# Patient Record
Sex: Female | Born: 1945 | Race: White | Hispanic: No | Marital: Married | Smoking: Former smoker
Health system: Southern US, Community
[De-identification: ages and names within clinical notes are randomized; demographics above are authoritative.]

## PROBLEM LIST (undated history)

## (undated) DIAGNOSIS — R112 Nausea with vomiting, unspecified: Secondary | ICD-10-CM

## (undated) DIAGNOSIS — K2270 Barrett's esophagus without dysplasia: Secondary | ICD-10-CM

## (undated) DIAGNOSIS — Z86010 Personal history of colonic polyps: Secondary | ICD-10-CM

## (undated) DIAGNOSIS — Z803 Family history of malignant neoplasm of breast: Secondary | ICD-10-CM

## (undated) DIAGNOSIS — IMO0002 Reserved for concepts with insufficient information to code with codable children: Secondary | ICD-10-CM

## (undated) DIAGNOSIS — M1990 Unspecified osteoarthritis, unspecified site: Secondary | ICD-10-CM

## (undated) DIAGNOSIS — K209 Esophagitis, unspecified without bleeding: Secondary | ICD-10-CM

## (undated) DIAGNOSIS — K648 Other hemorrhoids: Secondary | ICD-10-CM

## (undated) DIAGNOSIS — K222 Esophageal obstruction: Secondary | ICD-10-CM

## (undated) DIAGNOSIS — C50919 Malignant neoplasm of unspecified site of unspecified female breast: Secondary | ICD-10-CM

## (undated) DIAGNOSIS — H3530 Unspecified macular degeneration: Secondary | ICD-10-CM

## (undated) DIAGNOSIS — Z860101 Personal history of adenomatous and serrated colon polyps: Secondary | ICD-10-CM

## (undated) DIAGNOSIS — Z460 Encounter for fitting and adjustment of spectacles and contact lenses: Secondary | ICD-10-CM

## (undated) DIAGNOSIS — I10 Essential (primary) hypertension: Secondary | ICD-10-CM

## (undated) DIAGNOSIS — N840 Polyp of corpus uteri: Secondary | ICD-10-CM

## (undated) DIAGNOSIS — R42 Dizziness and giddiness: Secondary | ICD-10-CM

## (undated) DIAGNOSIS — G43909 Migraine, unspecified, not intractable, without status migrainosus: Secondary | ICD-10-CM

## (undated) DIAGNOSIS — D126 Benign neoplasm of colon, unspecified: Secondary | ICD-10-CM

## (undated) DIAGNOSIS — K219 Gastro-esophageal reflux disease without esophagitis: Secondary | ICD-10-CM

## (undated) DIAGNOSIS — IMO0001 Reserved for inherently not codable concepts without codable children: Secondary | ICD-10-CM

## (undated) DIAGNOSIS — Z9889 Other specified postprocedural states: Secondary | ICD-10-CM

## (undated) DIAGNOSIS — I499 Cardiac arrhythmia, unspecified: Secondary | ICD-10-CM

## (undated) DIAGNOSIS — K449 Diaphragmatic hernia without obstruction or gangrene: Secondary | ICD-10-CM

## (undated) HISTORY — DX: Migraine, unspecified, not intractable, without status migrainosus: G43.909

## (undated) HISTORY — DX: Unspecified osteoarthritis, unspecified site: M19.90

## (undated) HISTORY — DX: Esophagitis, unspecified without bleeding: K20.90

## (undated) HISTORY — DX: Malignant neoplasm of unspecified site of unspecified female breast: C50.919

## (undated) HISTORY — DX: Benign neoplasm of colon, unspecified: D12.6

## (undated) HISTORY — PX: TONSILLECTOMY: SUR1361

## (undated) HISTORY — PX: OVARIAN CYST REMOVAL: SHX89

## (undated) HISTORY — DX: Esophageal obstruction: K22.2

## (undated) HISTORY — DX: Reserved for concepts with insufficient information to code with codable children: IMO0002

## (undated) HISTORY — DX: Reserved for inherently not codable concepts without codable children: IMO0001

## (undated) HISTORY — DX: Barrett's esophagus without dysplasia: K22.70

## (undated) HISTORY — DX: Personal history of colonic polyps: Z86.010

## (undated) HISTORY — DX: Personal history of adenomatous and serrated colon polyps: Z86.0101

## (undated) HISTORY — DX: Gastro-esophageal reflux disease without esophagitis: K21.9

## (undated) HISTORY — DX: Other hemorrhoids: K64.8

## (undated) HISTORY — DX: Family history of malignant neoplasm of breast: Z80.3

## (undated) HISTORY — PX: COLONOSCOPY: SHX174

## (undated) HISTORY — DX: Diaphragmatic hernia without obstruction or gangrene: K44.9

## (undated) HISTORY — PX: UPPER GI ENDOSCOPY: SHX6162

## (undated) HISTORY — DX: Essential (primary) hypertension: I10

## (undated) HISTORY — DX: Esophagitis, unspecified: K20.9

---

## 1997-06-14 ENCOUNTER — Ambulatory Visit (HOSPITAL_COMMUNITY): Admission: RE | Admit: 1997-06-14 | Discharge: 1997-06-14 | Payer: Self-pay | Admitting: Sports Medicine

## 1997-06-14 ENCOUNTER — Encounter: Admission: RE | Admit: 1997-06-14 | Discharge: 1997-06-14 | Payer: Self-pay | Admitting: Family Medicine

## 1998-08-07 ENCOUNTER — Other Ambulatory Visit: Admission: RE | Admit: 1998-08-07 | Discharge: 1998-08-07 | Payer: Self-pay | Admitting: Obstetrics and Gynecology

## 1999-05-08 ENCOUNTER — Other Ambulatory Visit: Admission: RE | Admit: 1999-05-08 | Discharge: 1999-05-08 | Payer: Self-pay | Admitting: Family Medicine

## 1999-07-08 ENCOUNTER — Encounter (INDEPENDENT_AMBULATORY_CARE_PROVIDER_SITE_OTHER): Payer: Self-pay

## 1999-07-08 ENCOUNTER — Ambulatory Visit (HOSPITAL_COMMUNITY): Admission: RE | Admit: 1999-07-08 | Discharge: 1999-07-08 | Payer: Self-pay | Admitting: Internal Medicine

## 1999-07-08 ENCOUNTER — Encounter: Payer: Self-pay | Admitting: Internal Medicine

## 1999-07-11 ENCOUNTER — Encounter: Payer: Self-pay | Admitting: Internal Medicine

## 2000-08-26 ENCOUNTER — Other Ambulatory Visit: Admission: RE | Admit: 2000-08-26 | Discharge: 2000-08-26 | Payer: Self-pay | Admitting: Family Medicine

## 2003-03-10 HISTORY — PX: TOTAL HIP ARTHROPLASTY: SHX124

## 2003-07-02 ENCOUNTER — Encounter: Admission: RE | Admit: 2003-07-02 | Discharge: 2003-07-02 | Payer: Self-pay | Admitting: Orthopedic Surgery

## 2003-08-13 ENCOUNTER — Inpatient Hospital Stay (HOSPITAL_COMMUNITY): Admission: RE | Admit: 2003-08-13 | Discharge: 2003-08-17 | Payer: Self-pay | Admitting: Orthopedic Surgery

## 2004-01-23 ENCOUNTER — Encounter: Admission: RE | Admit: 2004-01-23 | Discharge: 2004-01-23 | Payer: Self-pay | Admitting: Orthopedic Surgery

## 2004-02-11 ENCOUNTER — Ambulatory Visit: Payer: Self-pay | Admitting: Internal Medicine

## 2004-02-18 ENCOUNTER — Ambulatory Visit: Payer: Self-pay | Admitting: Internal Medicine

## 2004-03-09 HISTORY — PX: NISSEN FUNDOPLICATION: SHX2091

## 2004-03-13 ENCOUNTER — Ambulatory Visit: Payer: Self-pay | Admitting: Internal Medicine

## 2004-03-17 ENCOUNTER — Ambulatory Visit: Payer: Self-pay | Admitting: Internal Medicine

## 2004-04-14 ENCOUNTER — Encounter: Admission: RE | Admit: 2004-04-14 | Discharge: 2004-04-14 | Payer: Self-pay | Admitting: Internal Medicine

## 2004-04-14 ENCOUNTER — Encounter (INDEPENDENT_AMBULATORY_CARE_PROVIDER_SITE_OTHER): Payer: Self-pay | Admitting: *Deleted

## 2004-05-06 ENCOUNTER — Encounter: Admission: RE | Admit: 2004-05-06 | Discharge: 2004-05-06 | Payer: Self-pay | Admitting: Orthopedic Surgery

## 2004-06-03 ENCOUNTER — Encounter: Payer: Self-pay | Admitting: Internal Medicine

## 2004-06-03 ENCOUNTER — Ambulatory Visit (HOSPITAL_COMMUNITY): Admission: RE | Admit: 2004-06-03 | Discharge: 2004-06-03 | Payer: Self-pay | Admitting: Internal Medicine

## 2004-09-04 ENCOUNTER — Ambulatory Visit (HOSPITAL_COMMUNITY): Admission: RE | Admit: 2004-09-04 | Discharge: 2004-09-04 | Payer: Self-pay | Admitting: Cardiology

## 2004-09-24 ENCOUNTER — Ambulatory Visit: Payer: Self-pay | Admitting: Internal Medicine

## 2004-09-29 ENCOUNTER — Encounter (INDEPENDENT_AMBULATORY_CARE_PROVIDER_SITE_OTHER): Payer: Self-pay | Admitting: Specialist

## 2004-09-29 ENCOUNTER — Ambulatory Visit: Payer: Self-pay | Admitting: Internal Medicine

## 2004-10-08 ENCOUNTER — Encounter (INDEPENDENT_AMBULATORY_CARE_PROVIDER_SITE_OTHER): Payer: Self-pay | Admitting: *Deleted

## 2004-10-29 ENCOUNTER — Inpatient Hospital Stay (HOSPITAL_COMMUNITY): Admission: RE | Admit: 2004-10-29 | Discharge: 2004-10-30 | Payer: Self-pay | Admitting: Surgery

## 2004-10-30 ENCOUNTER — Encounter (INDEPENDENT_AMBULATORY_CARE_PROVIDER_SITE_OTHER): Payer: Self-pay | Admitting: *Deleted

## 2006-02-03 IMAGING — CR DG PELVIS 1-2V
2 series · 2 of 2 positions shown · non-contrast
Comparison: none

CLINICAL DATA: total hip replacement
 PELVIS (ONE VIEW)
 Two images were obtained.  There has been a total hip replacement on the right with satisfactory position and alignment on this single view. Mild cartilage loss and spurring on the left hip.
 IMPRESSION
 Satisfactory right hip replacement.

[view not recorded (1 of 2)]
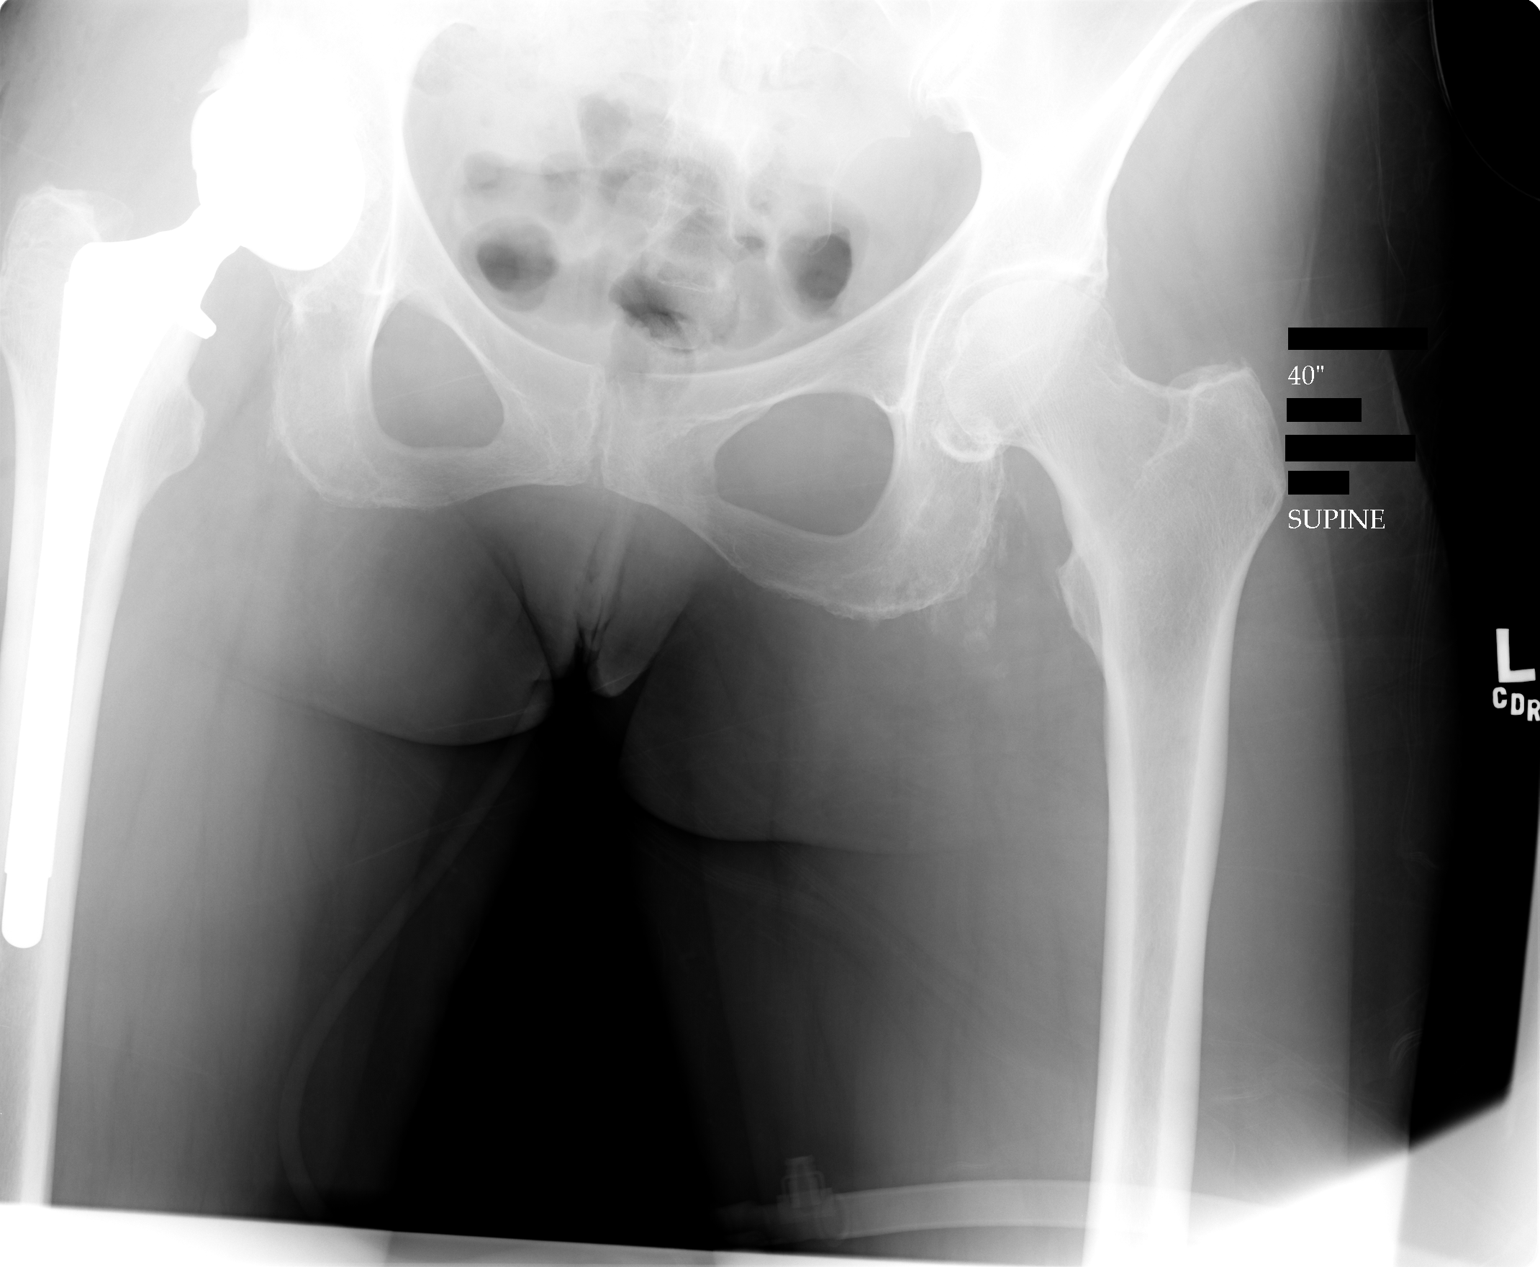

[view not recorded (2 of 2)]
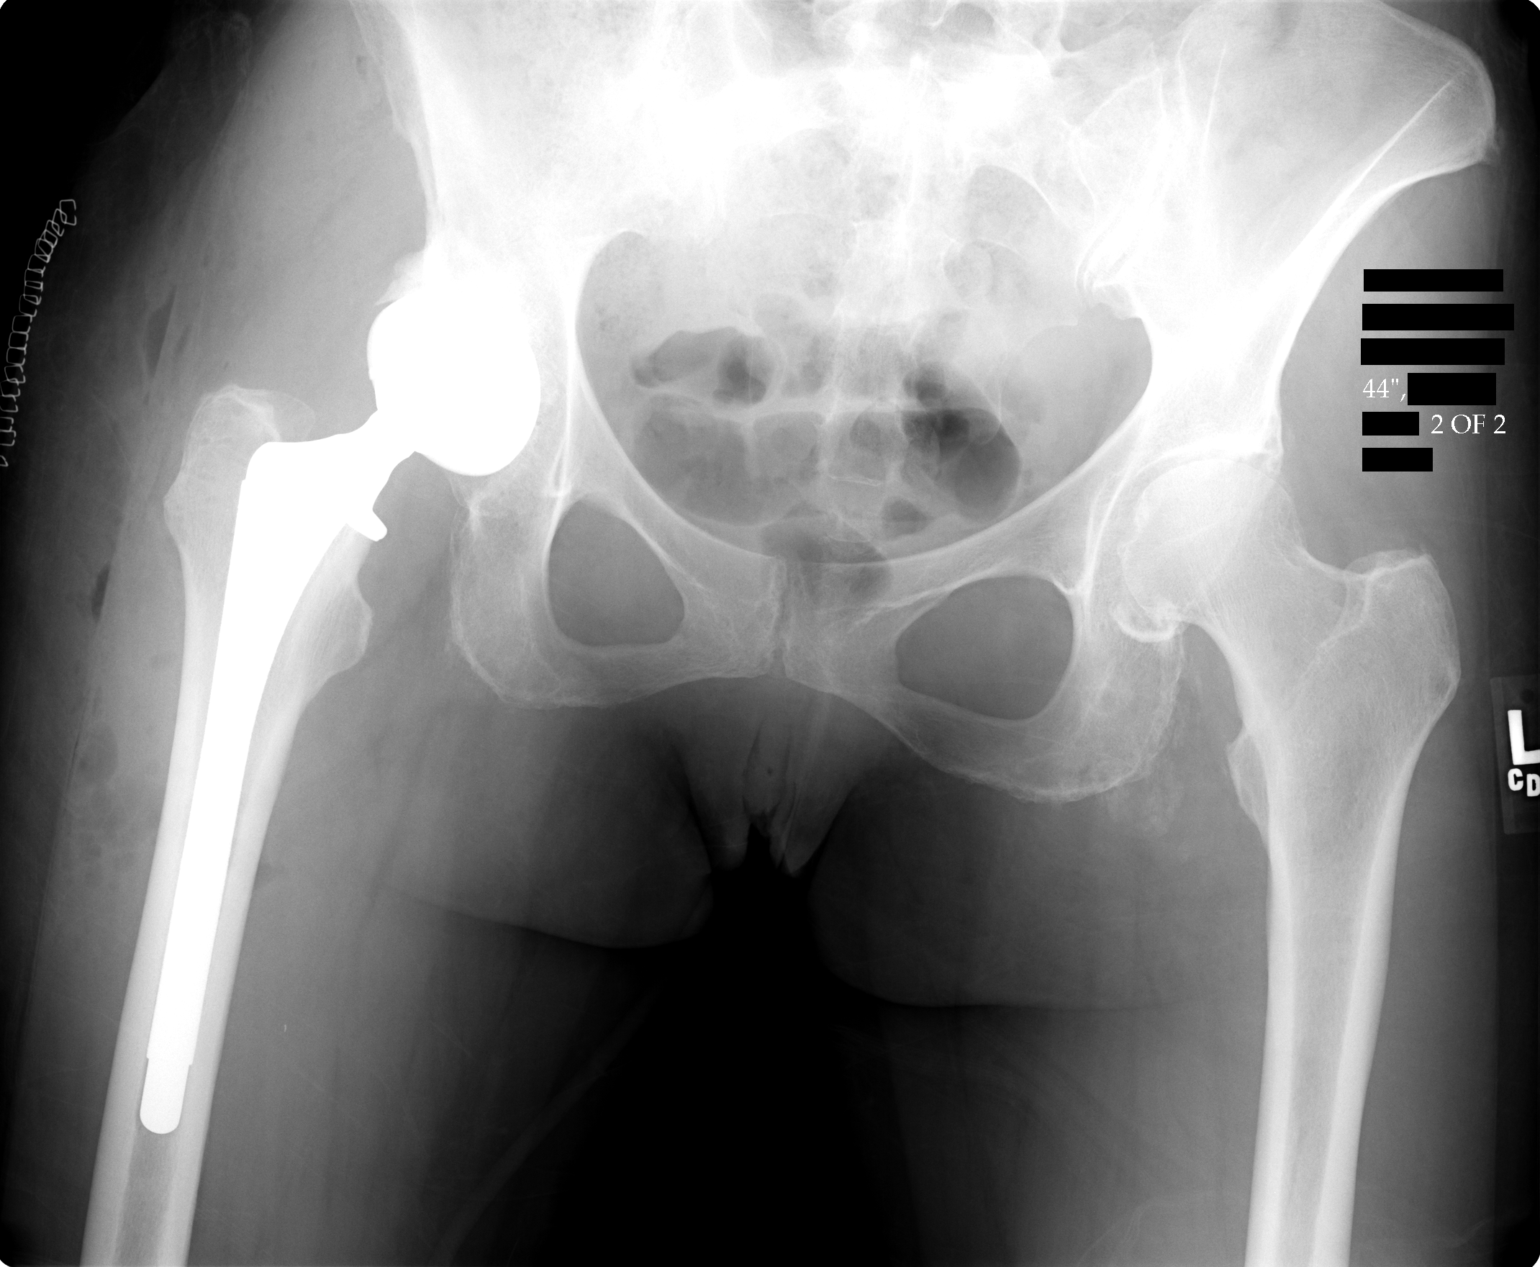

[2 of 2 positions shown; findings below may reference images not displayed]

## 2007-03-18 ENCOUNTER — Ambulatory Visit: Payer: Self-pay | Admitting: Internal Medicine

## 2007-03-25 ENCOUNTER — Ambulatory Visit: Payer: Self-pay | Admitting: Internal Medicine

## 2007-03-25 ENCOUNTER — Encounter: Payer: Self-pay | Admitting: Internal Medicine

## 2007-03-25 DIAGNOSIS — K2270 Barrett's esophagus without dysplasia: Secondary | ICD-10-CM

## 2007-03-25 HISTORY — DX: Barrett's esophagus without dysplasia: K22.70

## 2009-02-04 ENCOUNTER — Encounter: Admission: RE | Admit: 2009-02-04 | Discharge: 2009-02-08 | Payer: Self-pay | Admitting: Orthopedic Surgery

## 2009-03-11 ENCOUNTER — Encounter (INDEPENDENT_AMBULATORY_CARE_PROVIDER_SITE_OTHER): Payer: Self-pay | Admitting: *Deleted

## 2009-10-11 ENCOUNTER — Telehealth: Payer: Self-pay | Admitting: Internal Medicine

## 2009-12-17 DIAGNOSIS — K222 Esophageal obstruction: Secondary | ICD-10-CM

## 2009-12-17 DIAGNOSIS — K2270 Barrett's esophagus without dysplasia: Secondary | ICD-10-CM

## 2009-12-17 DIAGNOSIS — Z86010 Personal history of colonic polyps: Secondary | ICD-10-CM

## 2009-12-17 DIAGNOSIS — K648 Other hemorrhoids: Secondary | ICD-10-CM | POA: Insufficient documentation

## 2009-12-17 DIAGNOSIS — K219 Gastro-esophageal reflux disease without esophagitis: Secondary | ICD-10-CM | POA: Insufficient documentation

## 2009-12-17 DIAGNOSIS — K449 Diaphragmatic hernia without obstruction or gangrene: Secondary | ICD-10-CM | POA: Insufficient documentation

## 2010-04-08 NOTE — Op Note (Signed)
Summary: Nissen Fundoplication   NAMEFIORELA, Alison [name]                 ACCOUNT NO.:  [id number]   MEDICAL RECORD NO.:  [id number]          PATIENT TYPE:  OIB   LOCATION:  1619                         FACILITY:  [facility]   PHYSICIAN:  Thornton Park. Daphine Deutscher, MD  DATE OF BIRTH:  1946/02/10   DATE OF PROCEDURE:  10/28/2004  DATE OF DISCHARGE:                                 OPERATIVE REPORT   PREOPERATIVE DIAGNOSIS:  Small hiatal hernia with gastroesophageal reflux  disease.   POSTOPERATIVE DIAGNOSIS:  Prominent hiatal hernia with evidence of  gastroesophageal reflux disease.   PROCEDURE:  Laparoscopic Nissen fundoplication over a 56 lighted bougie with  closure of the hiatal hernia.   SURGEON:  Thornton Park. Daphine Deutscher, MD   ASSISTANT:  Danna Hefty, MD.   ANESTHESIA:  General endotracheal.   ESTIMATED BLOOD LOSS:  Minimal.   DRAINS:  None.   OPERATIVE TIME:  2 hours.   DESCRIPTION OF PROCEDURE:  This is 65 year old lady who was taken to OR 1,  given general anesthesia on October 28, 2004. Access to the abdomen was  gained with a 10/11 OptiView trocar in the left upper quadrant. She had a  very narrow costal margin. I placed two trocars in the right upper quadrant  and these were almost on top of each other because of her size. One was  placed to the left slightly below the umbilicus for the camera. A 5 mm with  the squiggly retractor was placed in the upper abdomen. This enabled the  liver to be retracted up and revealed a small hiatal hernia with evidence of  sliding. Dissection of the foregut was then performed and taken down to the  pars flaccida and coming along the right crus. I encountered a lot of  inflammatory changes chronic. To took down the peritoneal reflection of the  small hiatal hernia anteriorly. I took down the short gastric's and this was  somewhat tedious because the stomach was very stuck to the spleen. I did  this with minimal blood loss and dissected the left  crus.   A 56 lighted bougie was about to be inserted and I went ahead and closed the  hiatus posteriorly with a #0 Surgidek using the Endostitch with pledgets and  a second free suture of #0 Surgidek using the articulating robotic suturing  device. A single suture was placed anteriorly and then the 56 lighted bougie  was passed by Dr. Rica Mast without difficulty. A simple suture anteriorly was  tied which subsequently __________ fairly loose.   With the 56 bougie in place, a wrap was created invaginating the upper  portion of the cardia and fundus around the distal esophagus, sutured in  place with three sutures using the Endostitch. A stapler was placed on the  uppermost knot.  All three sutures had purchases of either the esophagus or  esophagogastric junction as there were tied. At the completion, Tisseel was  applied over the anterior portion of the stomach where the knots were and  also was used to fill the low anterior hiatus remnant that was visible when  the dilator was removed. Surgiwrap was then placed between the stomach and  the liver upon completing it and the retractor was removed. There was no  evidence of  bleeding or perforation of anything. The gas was withdrawn and ports were  removed. Skin was closed 4-0 Vicryl, Benzoin and Steri-Strips. The patient  seemed to tolerate the procedure well was taken to the recovery room in  satisfactory condition.      Thornton Park Daphine Deutscher, MD  Electronically Signed     MBM/MEDQ  D:  10/28/2004  T:  10/29/2004  Job:  [id number]   cc:   [name], M.D. [facility]  [address]  [place]  [place] [address]

## 2010-04-08 NOTE — Procedures (Signed)
Summary: EGD   EGD  Procedure date:  03/17/2004  Findings:      Location: [facility]   Patient Name: Alison [name], Alison [name]. MRN:  Procedure Procedures: Panendoscopy (EGD) CPT: 43235.    with biopsy(s)/brushing(s). CPT: [id number].    with esophageal dilation. CPT: G9296129.  Personnel: Endoscopist: Crawford Tamura L. Juanda Chance, MD.  Referred By: Eugenio Hoes Tawanna Cooler, MD.  Exam Location: Exam performed in Outpatient Clinic. Outpatient  Patient Consent: Procedure, Alternatives, Risks and Benefits discussed, consent obtained, from patient. Consent was obtained by the RN.  Indications Symptoms: Dysphagia. Reflux symptoms for 6-10 yrs, occurring daily.  Surveillance of: 2001.  History  Current Medications: Patient is not currently taking Coumadin.  Pre-Exam Physical: Performed Mar 17, 2004  Entire physical exam was normal.  Exam Exam Info: Maximum depth of insertion Duodenum, intended Duodenum. Vocal cords visualized. Gastric retroflexion performed. Images taken. ASA Classification: I. Tolerance: good.  Sedation Meds: Patient assessed and found to be appropriate for moderate (conscious) sedation. Fentanyl 75 mcg. given IV. Versed 7 mg. given IV. Cetacaine Spray given aerosolized.  Monitoring: BP and pulse monitoring done. Oximetry used. Supplemental O2 given  Findings - ESOPHAGEAL INFLAMMATION: established as a result of reflux. Severity is moderate, erosions present.  Length of inflammation: 2 cm. Edema present. [place] Classification: Grade B. Biopsy/Esoph Inflamtn taken. ICD9: Esophagitis: 530.10. Comments: wide open reflux, irregular g-e junction with lond erosion, mild fibrous ring.  - Dilation: Mid Esophagus. Maloney dilator used, Diameter: 48 mm, Minimal Resistance, Minimal Heme present on extraction.   Assessment Abnormal examination, see findings above.  Diagnoses: 530.10: Esophagitis.   Comments: reflux esophagitis, mild stricture, s/p passage of 68F  Maloney dilator, r/o Barrett's esophagus Events  Unplanned Intervention: No unplanned interventions were required.  Unplanned Events: There were no complications. Plans Medication(s): Await pathology. PPI: Aciphex 20 mg BID, starting Mar 17, 2004  Promotility: Metaclopramide 10 mg ac, starting Mar 17, 2004   Comments: patient may be a good candidate for Nissen Fundoplication Disposition: After procedure patient sent to recovery. After recovery patient sent home.   This report was created from the original endoscopy report, which was reviewed and signed by the above listed endoscopist.

## 2010-04-08 NOTE — Procedures (Signed)
Summary: EGD   EGD  Procedure date:  07/11/1999  Findings:      Location: [facility]                          [facility]  Patient:    Alison [name], Alison [name]                        MRN: [id number] Adm. Date:  [id number] Disc. Date: [id number] Attending:  Mervin Hack CC:         Evette Georges, M.D. [facility]                           Procedure Report  PROCEDURE:  Upper endoscopy with esophageal dilation.  INDICATION FOR PROCEDURE:  This 65 year old white female has complained of long-standing gastroesophageal reflux symptoms. She has also developed intermittent solid food dysphagia. She has been on Prilosec but has taken it only on a p.r.n. basis. She is undergoing upper endoscopy to rule out Barretts esophagus and also to proceed with esophageal dilation.  ENDOSCOPE:  Olympus single-channel videoscope  SEDATION:  Versed 8 mg IV, Demerol 50 mg IV  FINDINGS:  The Olympus single-channel video endoscope passed under direct vision through the posterior pharynx into the esophagus. The patient was monitored by pulse oximeter. Her oxygen saturations were normal. Proximal and mid esophageal  mucosa was unremarkable. There were several linear erosions at the gastroesophageal junction with increased fibrous tissue and mild nonobstructing stricture. The erosions constituted grade 1 reflux esophagitis. The erosions measured less than 5 mm in length. Biopsies were taken from the GE junction to rule out Barretts esophagus.  STOMACH:  The stomach was insufflated with air and showed a normal appearing gastric mucosal body in the gastric antrum. The pyloric outlet was normal. retroflexion of the endoscope revealed a normal fundus and cardia.  DUODENUM:  The duodenal bulb and descending duodenum were normal. A guidewire was then placed into the stomach under fluoroscopic guidance and Savary dilators were passed over the guidewire using 14, 15, 16, and 17 mm  dilators. The patient tolerated the procedure well.  IMPRESSION: 1. Benign distal esophageal stricture status post dilation to 17 mm. 2. Grade 1 reflux esophagitis.  PLAN: 1. Strict antireflux measures. 2. Avoidance of aspirin and NSAIDs. 3. The patient will need to continue Prilosec on a regular basis rather than on a    p.r.n. basis. She may need to repeat esophageal dilation in the future. DD:  07/08/99 TD:  07/10/99 Job: [id number]  This report was created from the original endoscopy report, which was reviewed and signed by the above listed endoscopist.

## 2010-04-08 NOTE — Procedures (Signed)
Summary: EGD   EGD  Procedure date:  09/29/2004  Findings:      Location: [facility]   Patient Name: Alison [name], Alison [name]. MRN:  Procedure Procedures: Panendoscopy (EGD) CPT: 43235.    with biopsy(s)/brushing(s). CPT: D1846139.  Personnel: Endoscopist: Dora L. Juanda Chance, MD.  Exam Location: Exam performed in Outpatient Clinic. Outpatient  Patient Consent: Procedure, Alternatives, Risks and Benefits discussed, consent obtained, from patient. Consent was obtained by the RN.  Indications Symptoms: Reflux symptoms for 1-5 yrs,  Surveillance of: Last exam: Jan, 2006.  Comments: hx of erosive esophagitis, pt is on PPI's, recent 24 hr ph probe off mjedications was negative. pt is being reendoscoped to assess  for reflux esophagitis History  Current Medications: Patient is not currently taking Coumadin.  Pre-Exam Physical: Performed Sep 29, 2004  Entire physical exam was normal.  Exam Exam Info: Maximum depth of insertion Duodenum, intended Duodenum. Vocal cords visualized. Gastric retroflexion performed. Images taken. ASA Classification: I. Tolerance: good.  Sedation Meds: Patient assessed and found to be appropriate for moderate (conscious) sedation. Fentanyl 50 mcg. given IV. Versed 5 mg. given IV. Cetacaine Spray 2 sprays given aerosolized.  Monitoring: BP and pulse monitoring done. Oximetry used. Supplemental O2 given  Findings - ESOPHAGEAL INFLAMMATION: established as a result of reflux. Severity is moderate, erosions present.  Length of inflammation: 1 cm. Edema present. [place] Classification: Grade B. Biopsy/Esoph Inflamtn taken. ICD9: Esophagitis, Reflux: 530.11.  - HIATAL HERNIA: 2 cms. in length. sliding reducible hiatal hernia. ICD9: Hernia, Hiatal: 553.3. Normal: Antrum.   Assessment Abnormal examination, see findings above.  Diagnoses: 553.3: Hernia, Hiatal.  530.11: Esophagitis, Reflux.   Comments: acute esophagitis despite of   PPI's Events  Unplanned Intervention: No unplanned interventions were required.  Unplanned Events: There were no complications. Plans Medication(s): Await pathology. PPI: Aciphex 20 mg BID, starting Sep 29, 2004   Comments: recommend Nissen Fundoplication, pt's concern is her occassional vomitting during migrain headaches Disposition: After procedure patient sent to recovery. After recovery patient sent home.   This report was created from the original endoscopy report, which was reviewed and signed by the above listed endoscopist.     FINAL DIAGNOSIS    ***MICROSCOPIC EXAMINATION AND DIAGNOSIS***    ESOPHAGOGASTRIC JUNCTION MUCOSA WITH MILD INFLAMMATION. NO   INTESTINAL METAPLASIA, DYSPLASIA OR MALIGNANCY IDENTIFIED   (BIOPSY).    COMMENT   An Alcian Blue stain is performed to determine the presence of   intestinal metaplasia (goblet cell metaplasia). No intestinal   metaplasia (goblet cell metaplasia) is identified with the Alcian   Blue stain. The control stained appropriately.   (BNS:glk 10-01-04)    gk   Date Reported: 10/01/2004 Havery Moros, MD   *** Electronically Signed Out By BNS ***    Clinical information   R/O Barrett' s (glk)    specimen(s) obtained   Esophagogastric junction, biopsy    Gross Description   Received in formalin are tan, soft tissue fragments that are   submitted in toto. Number: multiple.   Size: 0.3 cm. Alcian Blue special stain is requested. (SP:caf   09/30/04)    cf/

## 2010-04-08 NOTE — Procedures (Signed)
Summary: COLON   EGD  Procedure date:  03/25/2007  Findings:      Location: [facility]    Procedures Next Due Date:    EGD: 04/2014 Patient Name: Alison [name], Alison [name] MRN:  Procedure Procedures: Colonoscopy CPT: 23762.  Personnel: Endoscopist: Dora L. Juanda Chance, MD.  Exam Location: Exam performed in Outpatient Clinic. Outpatient  Patient Consent: Procedure, Alternatives, Risks and Benefits discussed, consent obtained, from patient. Consent was obtained by the RN.  Indications  Surveillance of: Adenomatous Polyp(s). Initial polypectomy was performed in 2001. 1-2 Polyps were found at Index Exam. Largest polyp removed was 1 to 5 mm. Prior polyp located in distal colon. Pathology of worst  polyp: tubular adenoma. Previous surveillance exam(s) in  2006,  History  Current Medications: Patient is not currently taking Coumadin.  Pre-Exam Physical: Performed Sep 29, 198. Entire physical exam was normal.  Comments: Pt. history reviewed/updated, physical exam performed prior to initiation of sedation?yes Exam Exam: Extent of exam reached: Cecum, extent intended: Cecum.  The cecum was identified by appendiceal orifice and IC valve. Duration of exam: time in 11:14, out 7:35 min minutes. Colon retroflexion performed. Images taken. ASA Classification: I. Tolerance: excellent.  Monitoring: Pulse and BP monitoring, Oximetry used. Supplemental O2 given.  Colon Prep Used Miralax for colon prep. Prep results: good.  Sedation Meds: Patient assessed and found to be appropriate for moderate (conscious) sedation. Fentanyl 50 mcg. given IV. Versed 5 mg. given IV.  Findings - NORMAL EXAM: Cecum.  - HEMORRHOIDS: Internal. Size: Grade I. ICD9: Hemorrhoids, Internal: 455.0.   Assessment Normal examination.  Diagnoses: 455.0: Hemorrhoids, Internal.   Comments: no recurrent polyps Events  Unplanned Interventions: No intervention was required.  Unplanned Events: There  were no complications. Plans Patient Education: Patient given standard instructions for: Patient instructed to get routine colonoscopy every 7 years.  Disposition: After procedure patient sent to recovery. After recovery patient sent home.   This report was created from the original endoscopy report, which was reviewed and signed by the above listed endoscopist.

## 2010-04-08 NOTE — Assessment & Plan Note (Signed)
Summary: Gastroenterology  ELESHIA [name] MR#:  [id number] Page #  NAME:  Alison [name], Alison [name]  OFFICE NO:  [id number]  DATE:  02/11/04  DOB:  03/28/2045  CHIEF COMPLAINT:  The patient is a very nice 65 year old white female with severe gastroesophageal reflux disease, erosive esophagitis on endoscopy in May 2001 treated with Prilosec and most recently with Prevacid and since the symptoms became much worse, she started on Aciphex 20 mg in the morning and Protonix 40 mg at night.  She had in 2001 a mild esophageal stricture, which was dilated to 17 mm.  She has occasional solid food dysphagia.  There is also a history of colon polyps.  The last colonoscopy revealed a polyp in the left colon.  She is for recall in 2006.  The patient is not sure why her symptoms are not responding to the acid-suppressing agents.  She does not smoke, avoids heavy lifting.  She has food regurgitation at night as well as when she bends over.  She has heartburn up to her throat after meals.  She also coughs after each meal.  Iced tea makes it worse.  She take Aleve about 3 times a week for right hip replacement.    PHYSICAL EXAMINATION:  VITAL SIGNS:  Blood pressure 136/68.  Pulse 72.  Weight 145 pounds.  GENERAL:  She was alert and oriented in no distress.  LUNGS:  Clear to auscultation.  COR:  With normal S1 and normal S2.  ABDOMEN:  Soft and nontender.  No subxiphoid pain or tenderness.  The liver edge at costal margin.  RECTAL:  Exam not done.  IMPRESSION:   7.  65 year old white female with severe gastroesophageal reflux documented on upper endoscopy.  No evidence of Barrett's esophagus at that time.  Exacerbation of the reflux symptoms could be due to her taking Aleve or possibly due to increased caffeine intake or stress.  We need to rule out recurrent stricture because of occasional dysphagia.   2.  History of colon polyps.  The patient is due for colonoscopy in the next 6 months.  PLAN:   1.  She wishes to have endoscopy and  colonoscopy at the same time.  We will go ahead and schedule this for January 2006 with possible esophageal dilatation and biopsies to rule out Barrett's esophagus. 2.  Continue Aciphex and Protonix. 3.  Add Reglan 5 mg after each meal and 10 mg at bedtime. 4.  Ultrasound of the gallbladder to rule out bile reflux. 5.  Discontinue Aleve.     Hedwig Morton. Juanda Chance, M.D. [id number] cc:  Dr. Tawanna Cooler D:  02/11/04; T:  ; Job [id number]

## 2010-04-08 NOTE — Letter (Signed)
Summary: Endoscopy Letter  [facility]  [address] [place], [place] [address]   Phone: [phone]  Fax: [phone]      March 11, 2009 MRN: [id number]   [facility] [address] [place], [place]  [address]   Dear Ms. Alison [name],   According to your medical record, it is time for you to schedule an Endoscopy. Endoscopic screening is recommended for patients with certain upper digestive tract conditions because of associated increased risk for cancers of the upper digestive system.  This letter has been generated based on the recommendations made at the time of your prior procedure. If you feel that in your particular situation this may no longer apply, please contact our office.  Please call our office at [phone]) to schedule this appointment or to update your records at your earliest convenience.  Thank you for cooperating with [place] to provide you with the very best care possible.   Sincerely,  Hedwig Morton.Juanda Chance, M.D.  [facility] Gastroenterology Division [phone]

## 2010-04-08 NOTE — Op Note (Signed)
Summary: Esophageal Manometry  NAME:  Alison [name], Alison [name]                 ACCOUNT NO.:  [id number]   MEDICAL RECORD NO.:  [id number]          PATIENT TYPE:  AMB   LOCATION:  ENDO                         FACILITY:  [facility]   PHYSICIAN:  [name], M.D. [facility]  DATE OF BIRTH:  08/09/1945   DATE OF PROCEDURE:  06/09/2004  DATE OF DISCHARGE:  06/03/2004                                 OPERATIVE REPORT   PROCEDURE:  Esophageal manometry.   INDICATIONS:  This 65 year old white female is being evaluated for anti-  reflux procedure. She has been on Aciphex 200 mg a day. Her symptoms have  been heartburn, occasional chest pain, regurgitation, and occasional  dysphagia.   DESCRIPTION OF PROCEDURE:  Manometry catheter was passed through nares into  the stomach through the lower esophageal sphincter which was located at 7.5  cm from the incisors. Lower esophageal sphincter pressure was normal at 22.1  mmHg (normal being 10 to 45 mmHg). Residual pressure was -5.6 mm. There was  a 99% relaxation of lower esophageal sphincter.   Peristaltic waves were measured in the proximal, mid, and distal esophagus.  All contractions out of 11 wet swallows were propulsive. Amplitude and  duration of the contractions were normal. There were no repetitive  contractions and no retrograde contractions. Upper esophageal sphincter  pressure was normal and relaxed with swallowing 78% of the time.   IMPRESSION:  Normal esophageal motility with no evidence of primary  peristaltic disease. Normal esophageal sphincter pressure. No  contraindication to anti-reflux procedure.      DB/MEDQ  D:  06/09/2004  T:  06/10/2004  Job:  [id number]

## 2010-04-08 NOTE — Procedures (Signed)
Summary: COLON   Colonoscopy  Procedure date:  03/17/2004  Findings:      Location:  [facility].   Patient Name: Alison, [name] MRN:  Procedure Procedures: Colonoscopy CPT: [phone].    with polypectomy. CPT: [id number].  Personnel: Endoscopist: Dora L. Juanda Chance, MD.  Referred By: Eugenio Hoes Tawanna Cooler, MD.  Exam Location: Exam performed in Outpatient Clinic. Outpatient  Patient Consent: Procedure, Alternatives, Risks and Benefits discussed, consent obtained, from patient. Consent was obtained by the RN.  Indications  Surveillance of: Adenomatous Polyp(s). Initial polypectomy was performed in 2001. 1-2 Polyps were found at Index Exam. Prior polyp located in distal colon.  History  Current Medications: Patient is not currently taking Coumadin.  Pre-Exam Physical: Performed Mar 17, 2004. Entire physical exam was normal.  Exam Exam: Extent of exam reached: Cecum, extent intended: Cecum.  The cecum was identified by appendiceal orifice and IC valve. Colon retroflexion performed. Images taken. ASA Classification: I. Tolerance: good.  Monitoring: Pulse and BP monitoring, Oximetry used. Supplemental O2 given.  Colon Prep Used Miralax for colon prep. Prep results: good.  Sedation Meds: Patient assessed and found to be appropriate for moderate (conscious) sedation. Fentanyl 25 mcg. given IV. Versed 3 mg. given IV.  Findings POLYP: Descending Colon, Maximum size: 8 mm. pedunculated polyp. Distance from Anus 50 cm. Procedure:  snare with cautery, removed, retrieved, Polyp sent to pathology. ICD9: Colon Polyps: 211.3.   Assessment Abnormal examination, see findings above.  Diagnoses: 211.3: Colon Polyps.   Comments: s/p polypectomy Events  Unplanned Interventions: No intervention was required.  Unplanned Events: There were no complications. Plans  Post Exam Instructions: No aspirin or non-steroidal containing medications: 2 weeks.  Medication Plan: Await  pathology.  Patient Education: Patient given standard instructions for: Yearly hemoccult testing recommended. Patient instructed to get routine colonoscopy every 3-5 years.  Disposition: After procedure patient sent to recovery. After recovery patient sent home.   This report was created from the original endoscopy report, which was reviewed and signed by the above listed endoscopist.

## 2010-04-08 NOTE — Discharge Summary (Signed)
Summary: Excessive Heartburn  NAME:  YASMEEN, MANKA                 ACCOUNT NO.:  [id number]   MEDICAL RECORD NO.:  [id number]          PATIENT TYPE:  INP   LOCATION:  1619                         FACILITY:  [facility]   PHYSICIAN:  Thornton Park. Daphine Deutscher, MD  DATE OF BIRTH:  07-05-1945   DATE OF ADMISSION:  10/28/2004  DATE OF DISCHARGE:  10/30/2004                                 DISCHARGE SUMMARY   CHIEF COMPLAINT:  Excessive heartburn.   HISTORY OF PRESENT ILLNESS:  Honour Schwieger is a 65 year old, white female with  very convincing history of gastroesophageal reflux disease who on endoscopy  has had inflammation noted.  She has been treated with Aciphex and Reglan.  She was brought in for laparoscopic Nissen fundoplication.   HOSPITAL COURSE:  Donnalynn Wheeless underwent a laparoscopic Nissen fundoplication  using a 56 lighted bougie.  She tolerated this procedure well.  She was  taken to the floor.  Labs on the first day was normal.  She swallowed clear  liquids and was taking Tylenol for pain.  She was ready for discharge on  June 24, doing very well.   CONDITION ON DISCHARGE:  Good.  Prognosis good.   DISCHARGE MEDICATIONS:  Tylenol elixir for pain.   FOLLOW UP:  Return to the office in 3 weeks.   DIET:  Instructions were given regarding p.o. diet.      Thornton Park Daphine Deutscher, MD  Electronically Signed     MBM/MEDQ  D:  10/30/2004  T:  10/30/2004  Job:  [id number]   cc:   [name], M.D. [facility]  [address]  [place]  [place] [address]

## 2010-04-08 NOTE — Procedures (Signed)
Summary: EGD   Colonoscopy  Procedure date:  07/08/1999  Findings:      Location:  [facility].                          [facility]  Patient:    Alison [name], Alison [name]                        MRN: [id number] Adm. Date:  [id number] Disc. Date: [id number] Attending:  Mervin Hack CC:         Evette Georges, M.D. [facility]                           Procedure Report  PROCEDURE:  Colonoscopy.  INDICATIONS:  This 65 year old white female has a history of colonic polyps on sigmoidoscopy several years ago.  She denies any lower GI symptoms.  She is now  undergoing colonoscopy for followup of colonic polyps.  ENDOSCOPIST:  Hedwig Morton. Juanda Chance, M.D.  ENDOSCOPE:  Olympus single-channel videoscope.  SEDATION:  Versed 2 mg IV; Demerol 50 mg IV.  FINDINGS:  Olympus single-channel videoscope was passed directly into the rectum to the sigmoid colon.  Patient was monitored by pulse oximetry; her oxygen saturations were normal.  Her prep was excellent.  Anal canal showed small first-grade hemorrhoids.  There was no prolapse and no active bleeding.  Sigmoid colon showed normal mucosa with no evidence of diverticula.  Colonoscope passed easily through the descending colon, splenic flexure, transverse colon to hepatic flexure where we found a small elongated polyp measuring about 8 mm.  It was removed with cold biopsy and sent to pathology.  The rest of the right colon was normal including  cecum and ileocecal valve.  Cecal pouch was easily distensible and appendiceal opening was normal.  Colonoscope was then retracted and her colon decompressed.  Patient tolerated procedure well.  IMPRESSION: 1. Right colon polyp, status post polypectomy. 2. First-grade hemorrhoids.  PLAN: 1. Await results of the colon pathology. 2. Consider repeat colonoscopy in five years. 3. High-fiber diet. DD:  07/08/99 TD:  07/10/99 Job: [id number]  This report was created from the  original endoscopy report, which was reviewed and signed by the above listed endoscopist.     FINAL DIAGNOSIS    ***MICROSCOPIC EXAMINATION AND DIAGNOSIS***    I. GE JUNCTION BIOPSIES: BENIGN SQUAMOUS MUCOSA WITH CHANGES   CONSISENT WITH REFLUX ESOPHAGITIS, NO GLANDULAR MUCOSA OR   BARRETT' S ESOPHAGUS IDENTIFIED.    II. COLON: POLYPOID COLONIC MUCOSA. NO ADENOMATOUS CHANGE OR   MALIGNANCY IDENTIFIED.    COMMENT   I. No intestinal metaplasia is identified with Alcian Blue   stain. The control stained appropriately.    ab   Date Reported: 07/09/1999 Marcie Bal, MD   *** Electronically Signed Out By TAZ ***    Clinical information   GERD, FxHx colon polyps, I. R/O Barrett' s. (tmc)    specimen(s) obtained   1: GE junction biopsy   2: Colon, polyp, right    Gross Description   I. Received in formalin is a 0.3 cm fragment of white   tissue which is entirely submitted in one block. An Alcian blue   stain was respectively requested.   II. Received in formalin is a tan, soft tissue fragment that   is submitted in toto. Size: 0.3 cm (PAS 5-1)    kv/

## 2010-04-08 NOTE — Procedures (Signed)
Summary: EGD   EGD  Procedure date:  03/25/2007  Findings:      Location: [facility]   Patient Name: Alison [name], Alison [name]. MRN:  Procedure Procedures: Panendoscopy (EGD) CPT: 43235.    with biopsy(s)/brushing(s). CPT: D1846139.    with esophageal dilation. CPT: G9296129.  Personnel: Endoscopist: Dora L. Juanda Chance, MD.  Exam Location: Exam performed in Outpatient Clinic. Outpatient  Patient Consent: Procedure, Alternatives, Risks and Benefits discussed, consent obtained, from patient. Consent was obtained by the RN.  Indications Symptoms: Dysphagia. Reflux symptoms for 1-5 yrs,  Surveillance of: 2006.  Comments: s/p Nissen Fundoplication in 2006, by Dr Daphine Deutscher after es. manometry, and  pH probe History  Current Medications: Patient is not currently taking Coumadin.  Pre-Exam Physical: Performed Mar 25, 2007  Entire physical exam was normal.  Comments: Pt. history reviewed/updated, physical exam performed prior to initiation of sedation?yes Exam Exam Info: Maximum depth of insertion Duodenum, intended Duodenum. Vocal cords visualized. Gastric retroflexion performed. Images taken. ASA Classification: I. Tolerance: good.  Sedation Meds: Patient assessed and found to be appropriate for moderate (conscious) sedation. Fentanyl 50 mcg. given IV. Versed 5 mg. given IV.  Monitoring: BP and pulse monitoring done. Oximetry used. Supplemental O2 given  Findings - DIAGNOSTIC TEST: Biopsy taken. from Distal Esophagus.  - PRIOR SURGERY: Distal Esophagus. Anti-Reflux Surgery. Comments: normal functioning Nissen fundoplication.  - Dilation: Distal Esophagus. Maloney dilator used, Diameter: 54 mm, Significant Resistance, No Heme present on extraction. 1  total dilators used. Patient tolerance excellent. Outcome: unsuccessful. Comments: Maloney dilator met resistance before passage was completed, therefore Savory dilator used.  - Dilation: Distal Esophagus. Pneumatic Balloon  dilator used, Diameter: 17 mm, Minimal Resistance, Minimal Heme present on extraction. 1  total dilators used. Patient tolerance excellent. Outcome: successful.  Normal: Antrum.  Normal: Duodenal Apex.   Assessment Abnormal examination, see findings above.  Comments: s/p Nissen fundoplication, s/p passage of 17 mm dilator Events  Unplanned Intervention: No unplanned interventions were required.  Unplanned Events: There were no complications. Plans Medication(s): Await pathology. PPI: Aciphex 20 mg QD, starting Mar 25, 2007   Comments: consider adding Reglan Disposition: After procedure patient sent to recovery. After recovery patient sent home.   This report was created from the original endoscopy report, which was reviewed and signed by the above listed endoscopist.     FINAL DIAGNOSIS    ***MICROSCOPIC EXAMINATION AND DIAGNOSIS***    GASTROESOPHAGEAL JUNCTION, BIOPSY: BENIGN GASTRIC TYPE MUCOSA   WITH MINUTE FOCUS OF INTESTINAL METAPLASIA CONSISTENT WITH   BARRETT' S MUCOSA. NO DYSPLASIA IDENTIFIED. BENIGN ESOPHAGEAL   MUCOSA.    COMMENT   An Alcian Blue stain is performed to determine the presence of   intestinal metaplasia (goblet cell metaplasia). The Alcian Blue   stain shows intestinal metaplasia (goblet cell metaplasia). The   control stained appropriately.    Dr. Laureen Ochs has also reviewed the biopsy and concurs. (EAA:cdc   03/28/07)    cc   Date Reported: 03/28/2007 Alden Server A. Delila Spence, MD   *** Electronically Signed Out By EAA ***    Clinical information   R/O Barrett' s (jes)    specimen(s) obtained   Gastroesophageal junction, biopsy    Gross Description   Received in formalin are tan, soft tissue fragments that are   submitted in toto. Number: multiple.   Size: 0.2 to 0.3 cm One block (TA:jes, 03/28/07)    jes/   Additional Information  HL7 RESULT STATUS : F  External IF Update Timestamp :  2007-03-27:21:52:00.000000 

## 2010-04-08 NOTE — Progress Notes (Signed)
Summary: Medication refill   Phone Note Call from Patient Call back at [facility] Phone [phone]   Caller: Patient Call For: Dr. Juanda Chance Reason for Call: Refill Medication Summary of Call: Needs a refill on her OMEPRAZOLE....sch'd for 12-23-09 Initial call taken by: Karna Christmas,  October 11, 2009 11:50 AM  Follow-up for Phone Call        I have left a message for patient that unfortunately, we will be unable to fill any medications for her until we see her in the office in October. We last saw her in the office in 2006 and her last procedure was completed in January 2009 which has been over 3 years ago. I have also advised her that she may get omeprazole OTC until she sees [place] in the office if she does need a PPI. Follow-up by: Lamona Curl CMA Duncan Dull),  October 11, 2009 12:37 PM

## 2010-07-25 NOTE — Procedures (Signed)
[facility]  Patient:    Alison [name], Alison [name]                        MRN: [id number] Adm. Date:  [id number] Disc. Date: [id number] Attending:  Mervin Hack CC:         Evette Georges, M.D. [facility]                           Procedure Report  PROCEDURE:  Upper endoscopy with esophageal dilation.  INDICATION FOR PROCEDURE:  This 65 year old white female has complained of long-standing gastroesophageal reflux symptoms. She has also developed intermittent solid food dysphagia. She has been on Prilosec but has taken it only on a p.r.n. basis. She is undergoing upper endoscopy to rule out Barretts esophagus and also to proceed with esophageal dilation.  ENDOSCOPE:  Olympus single-channel videoscope  SEDATION:  Versed 8 mg IV, Demerol 50 mg IV  FINDINGS:  The Olympus single-channel video endoscope passed under direct vision through the posterior pharynx into the esophagus. The patient was monitored by pulse oximeter. Her oxygen saturations were normal. Proximal and mid esophageal  mucosa was unremarkable. There were several linear erosions at the gastroesophageal junction with increased fibrous tissue and mild nonobstructing stricture. The erosions constituted grade 1 reflux esophagitis. The erosions measured less than 5 mm in length. Biopsies were taken from the GE junction to rule out Barretts esophagus.  STOMACH:  The stomach was insufflated with air and showed a normal appearing gastric mucosal body in the gastric antrum. The pyloric outlet was normal. retroflexion of the endoscope revealed a normal fundus and cardia.  DUODENUM:  The duodenal bulb and descending duodenum were normal. A guidewire was then placed into the stomach under fluoroscopic guidance and Savary dilators were passed over the guidewire using 14, 15, 16, and 17 mm dilators. The patient tolerated the procedure well.  IMPRESSION: 1. 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 2. Grade 1 reflux esophagitis.  PLAN: 1. Strict antireflux measures. 2. Avoidance of aspirin and NSAIDs. 3. The patient will need to continue Prilosec on a regular basis rather than on a    p.r.n. basis. She may need to repeat esophageal dilation in the future. DD:  07/08/99 TD:  07/10/99 Job: [id number]

## 2010-07-25 NOTE — Discharge Summary (Signed)
[name], [name]                 ACCOUNT NO.:  [id number]   MEDICAL RECORD NO.:  [id number]          PATIENT TYPE:  INP   LOCATION:  1619                         FACILITY:  [facility]   PHYSICIAN:  Thornton Park. Daphine Deutscher, MD  DATE OF BIRTH:  04-04-1945   DATE OF ADMISSION:  10/28/2004  DATE OF DISCHARGE:  10/30/2004                                 DISCHARGE SUMMARY   CHIEF COMPLAINT:  Excessive heartburn.   HISTORY OF PRESENT ILLNESS:  Alison [name] is a 65 year old, white female with  very convincing history of gastroesophageal reflux disease who on endoscopy  has had inflammation noted.  She has been treated with Aciphex and Reglan.  She was brought in for laparoscopic Nissen fundoplication.   HOSPITAL COURSE:  Alison [name] underwent a laparoscopic Nissen fundoplication  using a 56 lighted bougie.  She tolerated this procedure well.  She was  taken to the floor.  Labs on the first day was normal.  She swallowed clear  liquids and was taking Tylenol for pain.  She was ready for discharge on  June 24, doing very well.   CONDITION ON DISCHARGE:  Good.  Prognosis good.   DISCHARGE MEDICATIONS:  Tylenol elixir for pain.   FOLLOW UP:  Return to the office in 3 weeks.   DIET:  Instructions were given regarding p.o. diet.      Thornton Park Daphine Deutscher, MD  Electronically Signed     MBM/MEDQ  D:  10/30/2004  T:  10/30/2004  Job:  [id number]   cc:   [name], M.D. [facility]  [address]  [place]  [place] [address]

## 2010-07-25 NOTE — Discharge Summary (Signed)
NAMEDOREA, DUFF                           ACCOUNT NO.:  [id number]   MEDICAL RECORD NO.:  [id number]                   PATIENT TYPE:  INP   LOCATION:  5008                                 FACILITY:  [facility]   PHYSICIAN:  Mila Homer. Sherlean Foot, M.D.              DATE OF BIRTH:  1946-02-04   DATE OF ADMISSION:  08/13/2003  DATE OF DISCHARGE:  08/17/2003                                 DISCHARGE SUMMARY   ADMISSION DIAGNOSES:  1. Severe osteoarthritis right hip.  2. Gastroesophageal reflux disease.  3. Mitral valve prolapse.  4. Vertigo.  5. Atypical migraines.   DISCHARGE DIAGNOSES:  1. Right total hip arthroplasty.  2. Postoperative blood loss anemia requiring 2 units of packed red blood     cells.  3. Gastroesophageal reflux disease.  4. History of mitral valve prolapse.  5. History of vertigo.  6. History of atypical migraines.   HISTORY OF PRESENT ILLNESS:  The patient is a 65 year old white female with  right hip pain for about a year, severe over the last 6-8 months.  She  denies any injuries.  The pain is worse with bending and getting up and  sitting and ambulating.  The pain is intermittent, is described as a dull  toothache-like pain radiates down to the knee at times.  X-rays reveal  severe OA of the hip.   ALLERGIES:  SULFA.   CURRENT MEDICATIONS:  1. Aciphex 20 mg p.o. daily.  2. Fish oil.  3. Calcium with D.  4. Green tea.  5. Garlic.  6. Glucosamine/chondroitin.  7. Wellness Formula multi-B vitamins.   SURGICAL PROCEDURE:  On August 13, 2003 the patient was taken to the OR by Dr.  Georgena Spurling, assisted by Rexene Edison, P.A.-C.  Under general anesthesia the  patient underwent a right total hip arthroplasty without any complications.  The patient had the following components implanted:  A size 12 femoral stem,  a size 50 mm outside diameter acetabular component without holes, a size  standard 32 mm inside diameter polyethylene component, a size 32 mm diameter  femoral ball +7 leg length.  The patient tolerated the procedure well and  was transferred to the recovery room and then to the orthopedic floor for  routine total hip protocol.   CONSULTS:  The following routine consults were requested:  Physical therapy,  case management.   HOSPITAL COURSE:  On August 13, 2003 the patient was admitted to [facility] under the care of Dr. Georgena Spurling.  The patient was taken to the  OR where a right total knee arthroplasty was performed.  The patient  tolerated the procedure well.  She was transferred to the recovery room and  then to the orthopedic floor for routine total hip protocol.  The patient  was placed on Lovenox for routine DVT prophylaxis.   The patient then incurred a total of 4 days postoperative  care in which the  patient did develop some postoperative blood loss anemia which was  symptomatic.  When she was typed and crossed and transfused 2 units of  packed red blood cells her hemoglobin went from 7.7 to 10.6 without any side  effects and tolerating the transfusion well.  Otherwise, the patient's wound  remained benign for any signs of infection other than some serosanguineous  discharge.  She worked well with physical therapy.  Her vital signs remained  stable and she remained afebrile.  Initially her pain was managed with IV  PCA and was transitioned over to p.o. without any complications.  It was  felt on postoperative day #4 the patient was medically and orthopedically  stable and ready for discharge to home so she was discharged home with  routine outpatient home health physical therapy arrangements made.   LABORATORY DATA:  CBC on June 10:  Wbc's 8.5, hemoglobin 10.6 - up from 7.7  the day previous after 2 units of packed red blood cells, her hematocrit was  30.0, platelets at 281.  Stool occult blood was negative.  Routine  chemistries on June 8:  Sodium 139, potassium 3.5, glucose 113 - labile from  a high of 129 felt to  be related to postoperative stress and inactivity, BUN  6, creatinine 0.6, calcium 8.1.  Routine urinalysis on admission was normal.  Urine culture showed no growth.   The patient received a total of 2 units of packed red blood cells.   MEDICATIONS UPON DISCHARGE FROM ORTHOPEDICS FLOOR:  1. Colace 100 mg p.o. b.i.d.  2. Trinsicon one tablet p.o. t.i.d.  3. Laxative or enema of choice p.r.n.  4. Percocet one or two tablets q.4-6h. p.r.n.  5. Tylenol 650 mg p.o. q.4h. p.r.n.  6. Skelaxin one or two tablets q.6h. p.r.n.  7. Calcium carbonate and vitamin D one tablet p.o. daily.  8. Multivitamins with minerals one tablet p.o. daily.  9. Aciphex dispensed as written 20 mg p.o. daily.  10.      Lovenox 30 mg subcu q.12h.   DISCHARGE INSTRUCTIONS:  1. Medications:  The patient is to resume routine home medications.  2. Lovenox 40 mg once a day for the next 13 days as directed.  3. Percocet 5 mg one or two tablets q.4-6h. for pain if needed.  4. Skelaxin 400 mg one tablet q.6-8h. as needed for spasms.  5. Iron 325 mg one tablet a day for the next 30 days.  6. Activity:  Weightbearing as tolerated with the use of a walker.  7. Diet:  No restrictions.  8. Wound care:  The patient is to keep wound clean and dry.  May shower     after 2 days of no drainage from the wound.  The patient is to call     office if temperature greater than 101.5, chills, foul-smelling     discharge, or swelling.  9. Special instructions:  [facility] for home health physical therapy.  10.      Follow-up:  The patient is to call Dr. Tobin Chad office for a follow-     up appointment about September 27, 2003 for an appointment.   CONDITION UPON DISCHARGE TO HOME:  Listed as improved and good.      Jamelle Rushing, P.A.                      Mila Homer. Sherlean Foot, M.D.    RWK/MEDQ  D:  10/05/2003  T:  10/05/2003  Job:  [id number]

## 2010-07-25 NOTE — Op Note (Signed)
NAMETARYN, Alison [name]                           ACCOUNT NO.:  [id number]   MEDICAL RECORD NO.:  [id number]                   PATIENT TYPE:  INP   LOCATION:  2867                                 FACILITY:  [facility]   PHYSICIAN:  Mila Homer. Sherlean Foot, M.D.              DATE OF BIRTH:  1945/05/13   DATE OF PROCEDURE:  08/13/2003  DATE OF DISCHARGE:                                 OPERATIVE REPORT   PREOPERATIVE DIAGNOSIS:  Right hip osteoarthritis.   POSTOPERATIVE DIAGNOSIS:  Right hip osteoarthritis.   PROCEDURE:  Right total hip arthroplasty.   SURGEON:  Mila Homer. Sherlean Foot, M.D.   ASSISTANTChestine Spore, P.A.   ANESTHESIA:  General.   INDICATION FOR PROCEDURE:  The patient is a 65 year old white female with  failure of conservative measures for osteoarthritis of the hip.  Informed  consent was obtained.   DESCRIPTION OF PROCEDURE:  The patient was administered general anesthesia  and Foley catheter placement in the supine position, then placed in the left-  down, right-up lateral decubitus position with bony prominences well-padded.  The right hip was prepped and draped in the usual sterile fashion.  A  minimally-invasive southern approach was performed.  The incision was 10 cm  in length.  This was done with a #10 blade.  Then used the cautery to  dissect down to the fascia lata and cut through the fascia lata along the  length of the incision and a little further distally and proximally as well.  Then placed the Charnley retractor in place.  I then performed a bursectomy  and then elevated a single sleeve of tissue of the anterior one-third of the  gluteus medius, the entire gluteus minimus, and the anterior 50% of the  vastus lateralis.  I tied these with three stay sutures.  I then performed  an anterior hip capsulectomy.  I then used the cutting guide to mark out the  femoral neck cut and made that cut with a reciprocating saw.  I then removed  the cut neck and head segment.  I then  placed Hohmann retractors anteriorly  and posteriorly.  There was a lot of soft tissue contracture around the  head, and this step was quite difficult.  I then removed all of the labrum  circumferentially and then switched sides of the table with my P.A.  I then  reamed sequentially from to 48 and then tamped in a 50 no-hole, no-spike  cup, which gave good stability.  I then tamped in a standard liner for  accepting a 32 mm head.  I then switched sides back to the back of the table  and used the Mueller retractor and elevated the cut surface of the femoral  neck to get access out of the  __________.  I then used a canal finder and side-biting reamers.  At this  point I reamed to a size 12 and broached  to a size 12 broach, and this gave  good stability.  I then trialled with multiple head sizes and chose a +7, 0  x 32 mm head.  This had excellent stability, good leg length and offset.  I  then removed the trial components on the femur and tamped down a real 12 mm  fully porous-coated stem, tamped on a #7 mm ball onto a clean Morse taper,  and located the hip.  I then closed the sleeve, the vastus lateralis,  gluteus medius, and gluteus minimus down through drill holes in the  trochanter and then oversewed with several figure-of-eight Ethibond sutures.  I then closed the fascia lata with running #1 Vicryl, the deep soft tissues  with interrupted 0 Vicryl, subcuticular with 2-0 Vicryl, skin with skin  staples, then dressed with Adaptic, 4 x 4's, ABDs, and Ioban drape.   COMPLICATIONS:  None.   DRAINS:  None.   ESTIMATED BLOOD LOSS:  300 mL.                                               Mila Homer. Sherlean Foot, M.D.    SDL/MEDQ  D:  08/13/2003  T:  08/13/2003  Job:  [id number]

## 2010-07-25 NOTE — Procedures (Signed)
[facility]  Patient:    [name], Alison [name]                        MRN: [id number] Adm. Date:  [id number] Disc. Date: [id number] Attending:  Mervin Hack CC:         Evette Georges, M.D. [facility]                           Procedure Report  PROCEDURE:  Colonoscopy.  INDICATIONS:  This 65 year old white female has a history of colonic polyps on sigmoidoscopy several years ago.  She denies any lower GI symptoms.  She is now  undergoing colonoscopy for followup of colonic polyps.  ENDOSCOPIST:  Hedwig Morton. Juanda Chance, M.D.  ENDOSCOPE:  Olympus single-channel videoscope.  SEDATION:  Versed 2 mg IV; Demerol 50 mg IV.  FINDINGS:  Olympus single-channel videoscope was passed directly into the rectum to the sigmoid colon.  Patient was monitored by pulse oximetry; her oxygen saturations were normal.  Her prep was excellent.  Anal canal showed small first-grade hemorrhoids.  There was no prolapse and no active bleeding.  Sigmoid colon showed normal mucosa with no evidence of diverticula.  Colonoscope passed easily through the descending colon, splenic flexure, transverse colon to hepatic flexure where we found a small elongated polyp measuring about 8 mm.  It was removed with cold biopsy and sent to pathology.  The rest of the right colon was normal including  cecum and ileocecal valve.  Cecal pouch was easily distensible and appendiceal opening was normal.  Colonoscope was then retracted and her colon decompressed.  Patient tolerated procedure well.  IMPRESSION: 1. Right colon polyp, status post polypectomy. 2. First-grade hemorrhoids.  PLAN: 1. Await results of the colon pathology. 2. Consider repeat colonoscopy in five years. 3. High-fiber diet. DD:  07/08/99 TD:  07/10/99 Job: [id number]

## 2010-07-25 NOTE — Op Note (Signed)
NAMEDELINA, Alison [name]                 ACCOUNT NO.:  [id number]   MEDICAL RECORD NO.:  [id number]          PATIENT TYPE:  OIB   LOCATION:  1619                         FACILITY:  [facility]   PHYSICIAN:  Alison Park. Daphine Deutscher, MD  DATE OF BIRTH:  21-Jan-1946   DATE OF PROCEDURE:  10/28/2004  DATE OF DISCHARGE:                                 OPERATIVE REPORT   PREOPERATIVE DIAGNOSIS:  Small hiatal hernia with gastroesophageal reflux  disease.   POSTOPERATIVE DIAGNOSIS:  Prominent hiatal hernia with evidence of  gastroesophageal reflux disease.   PROCEDURE:  Laparoscopic Nissen fundoplication over a 56 lighted bougie with  closure of the hiatal hernia.   SURGEON:  Alison Park. Daphine Deutscher, MD   ASSISTANT:  Alison Hefty, MD.   ANESTHESIA:  General endotracheal.   ESTIMATED BLOOD LOSS:  Minimal.   DRAINS:  None.   OPERATIVE TIME:  2 hours.   DESCRIPTION OF PROCEDURE:  This is 65 year old lady who was taken to OR 1,  given general anesthesia on October 28, 2004. Access to the abdomen was  gained with a 10/11 OptiView trocar in the left upper quadrant. She had a  very narrow costal margin. I placed two trocars in the right upper quadrant  and these were almost on top of each other because of her size. One was  placed to the left slightly below the umbilicus for the camera. A 5 mm with  the squiggly retractor was placed in the upper abdomen. This enabled the  liver to be retracted up and revealed a small hiatal hernia with evidence of  sliding. Dissection of the foregut was then performed and taken down to the  pars flaccida and coming along the right crus. I encountered a lot of  inflammatory changes chronic. To took down the peritoneal reflection of the  small hiatal hernia anteriorly. I took down the short gastric's and this was  somewhat tedious because the stomach was very stuck to the spleen. I did  this with minimal blood loss and dissected the left crus.   A 56 lighted bougie was  about to be inserted and I went ahead and closed the  hiatus posteriorly with a #0 Surgidek using the Endostitch with pledgets and  a second free suture of #0 Surgidek using the articulating robotic suturing  device. A single suture was placed anteriorly and then the 56 lighted bougie  was passed by Dr. Rica [name] without difficulty. A simple suture anteriorly was  tied which subsequently __________ fairly loose.   With the 56 bougie in place, a wrap was created invaginating the upper  portion of the cardia and fundus around the distal esophagus, sutured in  place with three sutures using the Endostitch. A stapler was placed on the  uppermost knot.  All three sutures had purchases of either the esophagus or  esophagogastric junction as there were tied. At the completion, Tisseel was  applied over the anterior portion of the stomach where the knots were and  also was used to fill the low anterior hiatus remnant that was visible when  the dilator was removed.  Surgiwrap was then placed between the stomach and  the liver upon completing it and the retractor was removed. There was no  evidence of  bleeding or perforation of anything. The gas was withdrawn and ports were  removed. Skin was closed 4-0 Vicryl, Benzoin and Steri-Strips. The patient  seemed to tolerate the procedure well was taken to the recovery room in  satisfactory condition.      Alison Park Daphine Deutscher, MD  Electronically Signed     MBM/MEDQ  D:  10/28/2004  T:  10/29/2004  Job:  [id number]   cc:   Alison [name], M.D. [facility]  [address]  [place]  [place] [address]

## 2010-07-25 NOTE — H&P (Signed)
Alison [name], Alison [name]                           ACCOUNT NO.:  [id number]   MEDICAL RECORD NO.:  [id number]                   PATIENT TYPE:  INP   LOCATION:  NA                                   FACILITY:  [facility]   PHYSICIAN:  Mila Homer. Sherlean Foot, M.D.              DATE OF BIRTH:  1945/03/18   DATE OF ADMISSION:  08/13/2003  DATE OF DISCHARGE:                                HISTORY & PHYSICAL   CHIEF COMPLAINT:  Right hip pain.   HISTORY OF PRESENT ILLNESS:  Alison [name] is a 65 year old white female with  right hip pain for approximately one year.  The pain has been severe for the  past 6-8 months.  She denies any injury.  The pain is worse with bending and  getting up from the sitting position.  The patient also has pain after walk  long distances.  Exercise makes her pain better, i.e., stationary bike,  treadmill, glider, weights and stretching.  She describes her pain as  intermittent, a dull toothache-like pain.  She does have radiation of the  pain from the hip down into her right knee at times.  The patient underwent  a cortisone injection into the right hip and since that time has had no knee  pain.  She states she has 50% reduction in the hip pain since undergoing the  cortisone injection.   X-rays of the right hip show severe osteoarthritis.  The patient therefore  is to undergo a right total hip arthroplasty at [facility] on August 12, 2003.   ALLERGIES:  SULFA.   MEDICATIONS:  1. Aciphex 20 mg p.o. every day.  2. Fish oil 2000 mg every day.  3. Stool softener 200 mg a day.  4. Calcium with vitamin D 1200 mg a day.  5. Green Tea 630 mg a day.  6. Garlic 2000 mg a day.  7. Multivitamin one p.o. every day.  8. Glucosamine chondroitin four times a day.  9. Wellness formula b.i.d.   PAST MEDICAL HISTORY:  Positive for GERD, mitral valve prolapse, vertigo and  atypical migraines.   PAST SURGICAL HISTORY:  1. Tonsillectomy.  2. Ovarian cyst removal.   SOCIAL  HISTORY:  She denies any tobacco use.  She has an occasional  alcoholic drink.  She is single.   The patient lives in a two story home with five steps to the usual entrance.  She is a retired Engineer, site.   PRIMARY CARE PHYSICIAN:  Birdena Jubilee, M.D., [facility], [facility] [phone]).   FAMILY HISTORY:  The patient's mother is deceased of lung cancer.  Father  deceased with a heart attack.  The patient has no siblings.   REVIEW OF SYSTEMS:  The patient denies any recent cold, cough, fever or flu-  like symptoms. She denies any shortness of breath, PND, orthopnea or chest  pain.  She wears contact in  the right eye at times.  She suffers from  reflux.  Otherwise, GI, GU negative.  The patient denies any neurologic or  hematologic medical problems.   PHYSICAL EXAMINATION:  GENERAL:  The patient is a well-developed, well-  nourished female who walks with a antalgic gait favoring her right side.  The patient is age appropriate.  She talks easily when I examine her.  MEASUREMENTS:  Height 5 feet 8 inches, weight 142 pounds.  VITAL SIGNS:  Temperature 97.4, blood pressure 122/62, respiratory rate 16,  pulse is 84.  NECK:  Trachea is midline.  No lymphadenopathy.  Carotids are 2+ without  bruits.  No tenderness along the cervical spine.  Full range of motion of  the cervical spine.  CARDIAC:  Regular rate and rhythm; no murmurs, rubs or gallops noted.  RESPIRATORY:  Clear to auscultation bilaterally without wheezing, rhonchi or  rales.  ABDOMEN:  Positive bowel sounds x4 quadrants.  Nontender to palpation.  Soft.  BACK:  Nontender to palpation over the thoracic and lumbar spine.  BREASTS/GENITOURINARY/RECTAL:  Exams all deferred at this time.  NEUROLOGICAL:  The patient is alert and oriented x 3.  Cranial nerves II-XII  are grossly intact.  Upper and lower extremity strength testing reveals 5/5  strength against resistance.  Deep tendon reflexes in the upper and lower   extremities are 2+ bilaterally, equal and symmetric.  MUSCULOSKELETAL:  Upper extremities are equal and symmetric in size and  shape.  Full range of motion.  Radial pulses are 2+ bilaterally.  The lower  extremities are equal in size and shape.  Left hip full range of motion.  Right hip very limited internal rotation and external rotation is also  limited.  Flexion is without pain and it is nonlimiting.  Left hip full  range of motion without pain.  Knees have full flexion and extension.  No  effusions are noted.  Lower extremities are nonedematous bilaterally.  Dorsal pedal pulses are 2+ bilaterally.   LABORATORY DATA:  X-rays are taken today in the office and show severe  osteoarthritis of the right hip.   IMPRESSION:  1. Severe osteoarthritis, right hip.  2. Gastroesophageal reflux disease.  3. Mitral valve prolapse.  4. Vertigo.  5. Atypical migraines.   PLAN:  The patient is to be admitted to [facility] on August 13, 2003  to undergo a right total hip arthroplasty by Dr. Sherlean Foot.  Prior to surgery,  the patient will undergo all preoperative testing and labs.      Richardean Canal, P.A.                       Mila Homer. Sherlean Foot, M.D.    GC/MEDQ  D:  08/07/2003  T:  08/07/2003  Job:  [id number]

## 2010-07-25 NOTE — Op Note (Signed)
NAMEMIRA, BALON                 ACCOUNT NO.:  [id number]   MEDICAL RECORD NO.:  [id number]          PATIENT TYPE:  AMB   LOCATION:  ENDO                         FACILITY:  [facility]   PHYSICIAN:  [name], M.D. [facility]  DATE OF BIRTH:  1946/02/13   DATE OF PROCEDURE:  06/09/2004  DATE OF DISCHARGE:  06/03/2004                                 OPERATIVE REPORT   PROCEDURE:  Esophageal manometry.   INDICATIONS:  This 65 year old white female is being evaluated for anti-  reflux procedure. She has been on Aciphex 200 mg a day. Her symptoms have  been heartburn, occasional chest pain, regurgitation, and occasional  dysphagia.   DESCRIPTION OF PROCEDURE:  Manometry catheter was passed through nares into  the stomach through the lower esophageal sphincter which was located at 7.5  cm from the incisors. Lower esophageal sphincter pressure was normal at 22.1  mmHg (normal being 10 to 45 mmHg). Residual pressure was -5.6 mm. There was  a 99% relaxation of lower esophageal sphincter.   Peristaltic waves were measured in the proximal, mid, and distal esophagus.  All contractions out of 11 wet swallows were propulsive. Amplitude and  duration of the contractions were normal. There were no repetitive  contractions and no retrograde contractions. Upper esophageal sphincter  pressure was normal and relaxed with swallowing 78% of the time.   IMPRESSION:  Normal esophageal motility with no evidence of primary  peristaltic disease. Normal esophageal sphincter pressure. No  contraindication to anti-reflux procedure.      DB/MEDQ  D:  06/09/2004  T:  06/10/2004  Job:  [id number]

## 2011-10-16 ENCOUNTER — Encounter: Payer: Self-pay | Admitting: Internal Medicine

## 2011-11-20 ENCOUNTER — Encounter: Payer: Self-pay | Admitting: *Deleted

## 2011-12-08 ENCOUNTER — Encounter: Payer: Self-pay | Admitting: Internal Medicine

## 2011-12-08 ENCOUNTER — Ambulatory Visit (INDEPENDENT_AMBULATORY_CARE_PROVIDER_SITE_OTHER): Payer: Medicare Other | Admitting: Internal Medicine

## 2011-12-08 VITALS — BP 120/70 | HR 80 | Ht 68.0 in | Wt 150.2 lb

## 2011-12-08 DIAGNOSIS — K219 Gastro-esophageal reflux disease without esophagitis: Secondary | ICD-10-CM

## 2011-12-08 DIAGNOSIS — K2270 Barrett's esophagus without dysplasia: Secondary | ICD-10-CM

## 2011-12-08 MED ORDER — OMEPRAZOLE 20 MG PO CPDR: 20.0000 mg | DELAYED_RELEASE_CAPSULE | Freq: Two times a day (BID) | ORAL | Status: DC

## 2011-12-08 NOTE — Patient Instructions (Addendum)
You have been scheduled for an endoscopy with propofol. Please follow written instructions given to you at your visit today. If you use inhalers (even only as needed), please bring them with you on the day of your procedure. We have sent the following medications to your pharmacy for you to pick up at your convenience: Prilosec CC: Dr Birdena Jubilee

## 2011-12-08 NOTE — Progress Notes (Signed)
Alison [name] [birth year] MRN [id number]   History of Present Illness:  This is a 66 year old white female with a history of Barrett's esophagus on her last upper endoscopy in January 2009. She is status post Nissen fundoplication for chronic gastroesophageal reflux. She is doing very well as far as her reflux is concerned taking Prilosec 20 mg twice a day. She has occasional dysphagia. She is up-to-date on her colonoscopy. Prior colonoscopies in 2001 and 2006 showed a hyperplastic polyp. Her last colonoscopy in January 2009 showed internal hemorrhoids but no polyps. She needs a refill on her Prilosec and is here to schedule a follow up endoscopy. She denies hoarseness or nocturnal cough.   Past Medical History  Diagnosis Date  . GERD (gastroesophageal reflux disease)   . Internal hemorrhoids   . Esophagitis   . 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 H/O adenomatous polyp of colon   . Esophageal stricture    Past Surgical History  Procedure Date  . Ovarian cyst removal   . Tonsillectomy   . Total hip arthroplasty   . Nissen fundoplication     reports that she has quit smoking. She has never used smokeless tobacco. She reports that she drinks alcohol. She reports that she does not use illicit drugs. family history includes Heart attack in her father and Lung cancer in her mother. Allergies  Allergen Reactions  . Morphine   . Sulfonamide Derivatives         Review of Systems: Denies abdominal pain negative for rectal bleeding  The remainder of the 10 point ROS is negative except as outlined in H&P    Assessment and Plan:  Problem #80 66 year old white female with Barrett's esophagus who is status post Nissen fundoplication. She is doing well on Prilosec 20 mg twice a day. We will refill her prescription for a 90 day supply. We will also schedule an upper endoscopy and biopsies.  Problem #2 Colorectal screening. Her last colonoscopy was in January 2009. A recall colonoscopy will be due  in January 2016.   12/08/2011 Alison [name]

## 2012-02-02 ENCOUNTER — Ambulatory Visit (AMBULATORY_SURGERY_CENTER): Payer: Medicare Other | Admitting: Internal Medicine

## 2012-02-02 ENCOUNTER — Encounter: Payer: Self-pay | Admitting: Internal Medicine

## 2012-02-02 VITALS — BP 128/76 | HR 68 | Temp 98.5°F | Resp 18 | Ht 68.0 in | Wt 150.0 lb

## 2012-02-02 DIAGNOSIS — K210 Gastro-esophageal reflux disease with esophagitis: Secondary | ICD-10-CM

## 2012-02-02 DIAGNOSIS — K219 Gastro-esophageal reflux disease without esophagitis: Secondary | ICD-10-CM

## 2012-02-02 DIAGNOSIS — K2270 Barrett's esophagus without dysplasia: Secondary | ICD-10-CM

## 2012-02-02 MED ORDER — SODIUM CHLORIDE 0.9 % IV SOLN: 500.0000 mL | INTRAVENOUS | Status: DC

## 2012-02-02 NOTE — Patient Instructions (Signed)
YOU HAD AN ENDOSCOPIC PROCEDURE TODAY AT THE [facility]: Refer to the procedure report that was given to you for any specific questions about what was found during the examination.  If the procedure report does not answer your questions, please call your gastroenterologist to clarify.  If you requested that your care partner not be given the details of your procedure findings, then the procedure report has been included in a sealed envelope for you to review at your convenience later.  YOU SHOULD EXPECT: Some feelings of bloating in the abdomen. Passage of more gas than usual.  Walking can help get rid of the air that was put into your GI tract during the procedure and reduce the bloating. If you had a lower endoscopy (such as a colonoscopy or flexible sigmoidoscopy) you may notice spotting of blood in your stool or on the toilet paper. If you underwent a bowel prep for your procedure, then you may not have a normal bowel movement for a few days.  DIET: Your first meal following the procedure should be a light meal and then it is ok to progress to your normal diet.  A half-sandwich or bowl of soup is an example of a good first meal.  Heavy or fried foods are harder to digest and may make you feel nauseous or bloated.  Likewise meals heavy in dairy and vegetables can cause extra gas to form and this can also increase the bloating.  Drink plenty of fluids but you should avoid alcoholic beverages for 24 hours.  ACTIVITY: Your care partner should take you home directly after the procedure.  You should plan to take it easy, moving slowly for the rest of the day.  You can resume normal activity the day after the procedure however you should NOT DRIVE or use heavy machinery for 24 hours (because of the sedation medicines used during the test).    SYMPTOMS TO REPORT IMMEDIATELY: A gastroenterologist can be reached at any hour.  During normal business hours, 8:30 AM to 5:00 PM Monday through Friday,  call [phone].  After hours and on weekends, please call the GI answering service at [phone] who will take a message and have the physician on call contact you.  Following upper endoscopy (EGD)  Vomiting of blood or coffee ground material  New chest pain or pain under the shoulder blades  Painful or persistently difficult swallowing  New shortness of breath  Fever of 100F or higher  Black, tarry-looking stools  FOLLOW UP: If any biopsies were taken you will be contacted by phone or by letter within the next 1-3 weeks.  Call your gastroenterologist if you have not heard about the biopsies in 3 weeks.  Our staff will call the home number listed on your records the next business day following your procedure to check on you and address any questions or concerns that you may have at that time regarding the information given to you following your procedure. This is a courtesy call and so if there is no answer at the home number and we have not heard from you through the emergency physician on call, we will assume that you have returned to your regular daily activities without incident.  SIGNATURES/CONFIDENTIALITY: You and/or your care partner have signed paperwork which will be entered into your electronic medical record.  These signatures attest to the fact that that the information above on your After Visit Summary has been reviewed and is understood.  Full responsibility of   the confidentiality of this discharge information lies with you and/or your care-partner. 

## 2012-02-02 NOTE — Progress Notes (Signed)
Patient did not experience any of the following events: a burn prior to discharge; a fall within the facility; wrong site/side/patient/procedure/implant event; or a hospital transfer or hospital admission upon discharge from the facility. (G8907) Patient did not have preoperative order for IV antibiotic SSI prophylaxis. (G8918)  

## 2012-02-02 NOTE — Op Note (Signed)
[facility] [address] [place], [address]   ENDOSCOPY PROCEDURE REPORT  PATIENT: Alison, [name]  MR#: [id number] BIRTHDATE: 03-Apr-1945 , 66  yrs. old GENDER: Female ENDOSCOPIST: Hart Carwin, MD REFERRED BY:  Birdena Jubilee, M.D. PROCEDURE DATE:  02/02/2012 PROCEDURE:  EGD w/ biopsy ASA CLASS:     Class II INDICATIONS:  follow up of Barrett's esophagus.   last EGD 2009-Barrett's, hx of Nissen Fundoplication. MEDICATIONS: MAC sedation, administered by CRNA and propofol (Diprivan) 200mg  IV TOPICAL ANESTHETIC: none  DESCRIPTION OF PROCEDURE: After the risks benefits and alternatives of the procedure were thoroughly explained, informed consent was obtained.  The LB GIF-H180 [id number] endoscope was introduced through the mouth and advanced to the second portion of the duodenum. Without limitations.  The instrument was slowly withdrawn as the mucosa was fully examined.      Z line was located at 36 cm from the incisors. it appears slightly irregular. Biopsies were taken to rule out Barrett's esophagus. Patient is status post prior Nissan fundoplication. There is no evidence of stricture       Retroflexed views revealed no abnormalities.     The scope was then withdrawn from the patient and the procedure completed.  COMPLICATIONS: There were no complications. ENDOSCOPIC IMPRESSION: irregular Z line. status post biopsies to rule out Barrett's esophagus  History of Nissen fundoplication   RECOMMENDATIONS: 1.  Continue PPI 2.  Anti-reflux regimen to be follow 3.   await biopsy rersults  REPEAT EXAM: for EGD pending biopsy results.  eSigned:  Hart Carwin, MD 02/02/2012 1:51 PM   CC:  PATIENT NAME:  Alison, [name] MR#: [id number]

## 2012-02-03 ENCOUNTER — Telehealth: Payer: Self-pay | Admitting: *Deleted

## 2012-02-03 NOTE — Telephone Encounter (Signed)
No answer, left message to call if questions or concerns. 

## 2012-02-09 ENCOUNTER — Encounter: Payer: Self-pay | Admitting: Internal Medicine

## 2012-07-11 ENCOUNTER — Other Ambulatory Visit: Payer: Medicare Other

## 2012-07-15 ENCOUNTER — Other Ambulatory Visit: Payer: Self-pay | Admitting: Family Medicine

## 2012-07-18 ENCOUNTER — Other Ambulatory Visit: Payer: Medicare Other

## 2012-07-18 ENCOUNTER — Other Ambulatory Visit: Payer: Self-pay | Admitting: Family Medicine

## 2012-07-18 LAB — CBC WITH DIFFERENTIAL/PLATELET
Basophils Absolute: 0 10*3/uL (ref 0.0–0.1)
Basophils Relative: 1 % (ref 0–1)
Eosinophils Absolute: 0.2 10*3/uL (ref 0.0–0.7)
Eosinophils Relative: 3 % (ref 0–5)
HCT: 38 % (ref 36.0–46.0)
Hemoglobin: 13.8 g/dL (ref 12.0–15.0)
Lymphocytes Relative: 31 % (ref 12–46)
Lymphs Abs: 1.6 10*3/uL (ref 0.7–4.0)
MCH: 30.1 pg (ref 26.0–34.0)
MCHC: 36.3 g/dL — ABNORMAL HIGH (ref 30.0–36.0)
MCV: 83 fL (ref 78.0–100.0)
Monocytes Absolute: 0.4 10*3/uL (ref 0.1–1.0)
Monocytes Relative: 8 % (ref 3–12)
Neutro Abs: 3 10*3/uL (ref 1.7–7.7)
Neutrophils Relative %: 57 % (ref 43–77)
Platelets: 326 10*3/uL (ref 150–400)
RBC: 4.58 MIL/uL (ref 3.87–5.11)
RDW: 13.4 % (ref 11.5–15.5)
WBC: 5.2 10*3/uL (ref 4.0–10.5)

## 2012-07-18 LAB — COMPLETE METABOLIC PANEL WITH GFR
ALT: 12 U/L (ref 0–35)
AST: 14 U/L (ref 0–37)
Albumin: 4.2 g/dL (ref 3.5–5.2)
Alkaline Phosphatase: 74 U/L (ref 39–117)
BUN: 12 mg/dL (ref 6–23)
CO2: 26 mEq/L (ref 19–32)
Calcium: 9.4 mg/dL (ref 8.4–10.5)
Chloride: 104 mEq/L (ref 96–112)
Creat: 0.7 mg/dL (ref 0.50–1.10)
GFR, Est African American: >89 mL/min
GFR, Est Non African American: 89 mL/min
Glucose, Bld: 96 mg/dL (ref 70–99)
Potassium: 4.1 mEq/L (ref 3.5–5.3)
Sodium: 139 mEq/L (ref 135–145)
Total Bilirubin: 0.8 mg/dL (ref 0.3–1.2)
Total Protein: 6.4 g/dL (ref 6.0–8.3)

## 2012-07-18 LAB — LIPID PANEL
Cholesterol: 180 mg/dL (ref 0–200)
HDL: 69 mg/dL (ref >39)
LDL Cholesterol: 99 mg/dL (ref 0–99)
Total CHOL/HDL Ratio: 2.6 Ratio
Triglycerides: 60 mg/dL (ref <150)
VLDL: 12 mg/dL (ref 0–40)

## 2012-07-18 LAB — TSH: TSH: 1.539 u[IU]/mL (ref 0.350–4.500)

## 2012-07-18 LAB — MICROALBUMIN, URINE: Microalb, Ur: 0.5 mg/dL (ref 0.00–1.89)

## 2012-07-19 LAB — VITAMIN D 25 HYDROXY (VIT D DEFICIENCY, FRACTURES): Vit D, 25-Hydroxy: 83 ng/mL (ref 30–89)

## 2012-07-26 ENCOUNTER — Ambulatory Visit (INDEPENDENT_AMBULATORY_CARE_PROVIDER_SITE_OTHER): Payer: Medicare Other | Admitting: Family Medicine

## 2012-07-26 ENCOUNTER — Encounter: Payer: Self-pay | Admitting: Family Medicine

## 2012-07-26 VITALS — BP 131/79 | HR 88 | Ht 67.75 in | Wt 142.0 lb

## 2012-07-26 DIAGNOSIS — R42 Dizziness and giddiness: Secondary | ICD-10-CM

## 2012-07-26 DIAGNOSIS — K219 Gastro-esophageal reflux disease without esophagitis: Secondary | ICD-10-CM

## 2012-07-26 DIAGNOSIS — Z0000 Encounter for general adult medical examination without abnormal findings: Secondary | ICD-10-CM

## 2012-07-26 DIAGNOSIS — G43909 Migraine, unspecified, not intractable, without status migrainosus: Secondary | ICD-10-CM

## 2012-07-26 DIAGNOSIS — R002 Palpitations: Secondary | ICD-10-CM

## 2012-07-26 MED ORDER — TRIAMTERENE-HCTZ 37.5-25 MG PO TABS: 1.0000 | ORAL_TABLET | Freq: Every day | ORAL | Status: DC

## 2012-07-26 MED ORDER — METOPROLOL TARTRATE 25 MG PO TABS: 25.0000 mg | ORAL_TABLET | Freq: Two times a day (BID) | ORAL | Status: DC

## 2012-07-26 MED ORDER — OMEPRAZOLE 20 MG PO CPDR: 20.0000 mg | DELAYED_RELEASE_CAPSULE | Freq: Two times a day (BID) | ORAL | Status: DC

## 2012-07-26 MED ORDER — SUMATRIPTAN 20 MG/ACT NA SOLN: 1.0000 | NASAL | Status: DC | PRN

## 2012-07-26 MED ORDER — MECLIZINE HCL 25 MG PO TABS: 25.0000 mg | ORAL_TABLET | Freq: Three times a day (TID) | ORAL | Status: DC | PRN

## 2012-07-26 NOTE — Progress Notes (Signed)
Subjective:    Patient ID: Alison [name], female    DOB: Oct 24, 1945, 67 y.o.   MRN: [id number]  HPI  Alison [name] is here today for her annual CPE.  We will go over her lab results and she needs to have her medications refilled.  She has done well since her last office visit.   Review of Systems  Constitutional: Negative for activity change, appetite change, fatigue and unexpected weight change.  HENT: Negative for hearing loss, ear pain, congestion, trouble swallowing, neck pain, dental problem and voice change.   Eyes: Negative for pain, redness and visual disturbance.  Respiratory: Negative for cough and shortness of breath.   Cardiovascular: Negative for chest pain, palpitations and leg swelling.  Gastrointestinal: Negative for nausea, vomiting, abdominal pain, diarrhea, constipation and blood in stool.  Endocrine: Negative for cold intolerance, heat intolerance, polydipsia, polyphagia and polyuria.  Genitourinary: Negative for dysuria, urgency, frequency, hematuria, vaginal discharge and pelvic pain.  Musculoskeletal: Negative for myalgias, back pain, joint swelling and arthralgias.  Skin: Negative for rash.  Neurological: Negative for dizziness, weakness and headaches.  Hematological: Negative for adenopathy. Does not bruise/bleed easily.  Psychiatric/Behavioral: Negative for sleep disturbance, dysphoric mood and decreased concentration. The patient is not nervous/anxious.    Past Medical History  Diagnosis Date  . GERD (gastroesophageal reflux disease)   . Internal hemorrhoids   . Esophagitis   . 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 H/O adenomatous polyp of colon   . Esophageal stricture    Family History  Problem Relation Age of Onset  . Heart attack Father   . Lung cancer Mother    History   Social History Narrative   Marital Status:  Single Chief Financial Officer)    Children:  None    Pets:  None   Living Situation: Lives with her significant other Hospital doctor)    Occupation: Retired Systems analyst)    Education: Engineer, maintenance (IT) ([employer])    Tobacco Use/Exposure:  She smoked for 5 years in her early 30s.     Alcohol Use:  Moderate   Drug Use:  None   Diet:  Regular   Exercise:  Active Life    Hobbies:  Fishing, [place], Golf                Objective:   Physical Exam  Constitutional: She is oriented to person, place, and time. She appears well-developed and well-nourished.  HENT:  Head: Normocephalic and atraumatic.  Right Ear: External ear normal.  Left Ear: External ear normal.  Nose: Nose normal.  Mouth/Throat: Oropharynx is clear and moist.  Eyes: Conjunctivae and EOM are normal. Pupils are equal, round, and reactive to light.  Neck: Normal range of motion. No thyromegaly present.  Cardiovascular: Normal rate, regular rhythm, normal heart sounds and intact distal pulses.  Exam reveals no gallop and no friction rub.   No murmur heard. Pulmonary/Chest: Effort normal and breath sounds normal. Right breast exhibits no inverted nipple, no mass, no nipple discharge, no skin change and no tenderness. Left breast exhibits no inverted nipple, no mass, no nipple discharge, no skin change and no tenderness. Breasts are symmetrical.  Abdominal: Soft. Bowel sounds are normal. Hernia confirmed negative in the right inguinal area and confirmed negative in the left inguinal area.  Genitourinary: Vagina normal and uterus normal. Pelvic exam was performed with patient supine. There is no rash, tenderness or lesion on the right labia. There is no rash, tenderness or lesion on the left labia. No vaginal discharge  found.  Musculoskeletal: Normal range of motion. She exhibits no edema and no tenderness.  Lymphadenopathy:    She has no cervical adenopathy.       Right: No inguinal adenopathy present.       Left: No inguinal adenopathy present.  Neurological: She is alert and oriented to person, place, and time. She has normal reflexes.  Skin: Skin is warm and dry.  She has numerous  seborrheic kerotoses.   Psychiatric: She has a normal mood and affect. Her behavior is normal. Judgment and thought content normal.      Assessment & Plan:   TIME 45 MINUTES:  MORE THAN 50% OF TIME WAS INVOLVED IN COUNSELING

## 2012-08-10 ENCOUNTER — Encounter: Payer: Self-pay | Admitting: Family Medicine

## 2012-08-10 DIAGNOSIS — Z0000 Encounter for general adult medical examination without abnormal findings: Secondary | ICD-10-CM | POA: Insufficient documentation

## 2012-08-10 DIAGNOSIS — K219 Gastro-esophageal reflux disease without esophagitis: Secondary | ICD-10-CM | POA: Insufficient documentation

## 2012-08-10 DIAGNOSIS — R42 Dizziness and giddiness: Secondary | ICD-10-CM | POA: Insufficient documentation

## 2012-08-10 DIAGNOSIS — G43909 Migraine, unspecified, not intractable, without status migrainosus: Secondary | ICD-10-CM | POA: Insufficient documentation

## 2012-08-10 DIAGNOSIS — I10 Essential (primary) hypertension: Secondary | ICD-10-CM | POA: Insufficient documentation

## 2012-08-10 DIAGNOSIS — R002 Palpitations: Secondary | ICD-10-CM | POA: Insufficient documentation

## 2012-08-10 NOTE — Assessment & Plan Note (Signed)
Refilled her Imitrex.

## 2012-08-10 NOTE — Assessment & Plan Note (Signed)
We discussed preventative issues for her age.

## 2012-08-10 NOTE — Assessment & Plan Note (Addendum)
Refilled her Maxzide and Antivert.

## 2012-08-10 NOTE — Assessment & Plan Note (Signed)
Refilled her Maxzide and Lopressor.

## 2012-08-10 NOTE — Assessment & Plan Note (Signed)
Refilled her omeprazole.

## 2012-08-10 NOTE — Assessment & Plan Note (Signed)
Her palpitations are controlled with Lopressor.  She was given a refill for this.

## 2012-08-11 ENCOUNTER — Other Ambulatory Visit: Payer: Self-pay | Admitting: Family Medicine

## 2012-08-11 NOTE — Telephone Encounter (Signed)
Please advice this patient this is a 30 day courtesy refill.  She needs to see Dr. Alberteen Sam to get medication refills for longer period.

## 2012-08-27 ENCOUNTER — Other Ambulatory Visit: Payer: Self-pay | Admitting: Family Medicine

## 2012-10-12 ENCOUNTER — Other Ambulatory Visit: Payer: Self-pay

## 2012-12-29 ENCOUNTER — Encounter: Payer: Self-pay | Admitting: *Deleted

## 2013-03-09 DIAGNOSIS — C50919 Malignant neoplasm of unspecified site of unspecified female breast: Secondary | ICD-10-CM

## 2013-03-09 HISTORY — DX: Malignant neoplasm of unspecified site of unspecified female breast: C50.919

## 2013-03-23 LAB — HM MAMMOGRAPHY: HM Mammogram: 1.1

## 2013-03-24 ENCOUNTER — Encounter: Payer: Self-pay | Admitting: *Deleted

## 2013-03-28 LAB — HM MAMMOGRAPHY

## 2013-03-29 ENCOUNTER — Encounter: Payer: Self-pay | Admitting: *Deleted

## 2013-04-10 ENCOUNTER — Encounter (INDEPENDENT_AMBULATORY_CARE_PROVIDER_SITE_OTHER): Payer: Self-pay | Admitting: General Surgery

## 2013-04-10 ENCOUNTER — Ambulatory Visit (INDEPENDENT_AMBULATORY_CARE_PROVIDER_SITE_OTHER): Payer: Medicare Other | Admitting: General Surgery

## 2013-04-10 VITALS — BP 110/70 | HR 70 | Resp 16 | Ht 68.5 in | Wt 147.0 lb

## 2013-04-10 DIAGNOSIS — R921 Mammographic calcification found on diagnostic imaging of breast: Secondary | ICD-10-CM | POA: Insufficient documentation

## 2013-04-10 DIAGNOSIS — R928 Other abnormal and inconclusive findings on diagnostic imaging of breast: Secondary | ICD-10-CM

## 2013-04-10 NOTE — Progress Notes (Signed)
Patient ID: Alison [name], female   DOB: [birth year], 68 y.o.   MRN: [id number]  Chief Complaint  Patient presents with  . Other    Eval lt br calcifications    HPI Alison [name] is a 68 y.o. female.   HPI  She is referred by Dr. Gae Dry for further evaluation and treatment of left breast calcifications at the 11:00 position. These were suspicious calcifications. They measure approximately 1.1 cm. Stereotactic biopsy was attempted but could not be performed. There is no family history of breast cancer. Age at menarche was 34. Age at menopause was 39. She is nulliparous.  Denies breast masses or breast pain. Denies nipple discharge.  Past Medical History  Diagnosis Date  . GERD (gastroesophageal reflux disease)   . Internal hemorrhoids   . Esophagitis   . 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 H/O adenomatous polyp of colon   . Esophageal stricture   . Arthritis     Past Surgical History  Procedure Laterality Date  . Ovarian cyst removal    . Tonsillectomy    . Total hip arthroplasty    . Nissen fundoplication      Family History  Problem Relation Age of Onset  . Heart attack Father   . Lung cancer Mother     Social History History  Substance Use Topics  . Smoking status: Former Smoker -- 5 years    Types: Cigarettes    Quit date: 08/10/1977  . Smokeless tobacco: Never Used     Comment: She smoked for 5 years in her early 67s.    . Alcohol Use: Yes    Allergies  Allergen Reactions  . Morphine   . Sulfonamide Derivatives     Current Outpatient Prescriptions  Medication Sig Dispense Refill  . meclizine (ANTIVERT) 25 MG tablet Take 1 tablet (25 mg total) by mouth 3 (three) times daily as needed for dizziness.  90 tablet  1  . metoprolol tartrate (LOPRESSOR) 25 MG tablet Take 1 tablet (25 mg total) by mouth 2 (two) times daily.  180 tablet  3  . omeprazole (PRILOSEC) 20 MG capsule Take 1 capsule (20 mg total) by mouth 2 (two) times daily.  180 capsule  3  .  SUMAtriptan (IMITREX) 20 MG/ACT nasal spray Place 1 spray (20 mg total) into the nose every 2 (two) hours as needed for migraine.  8 Inhaler  11  . triamterene-hydrochlorothiazide (MAXZIDE-25) 37.5-25 MG per tablet TAKE 1 TABLET(S) BY MOUTH EVERY MORNING  30 tablet  0   No current facility-administered medications for this visit.    Review of Systems Review of Systems  Constitutional: Negative.   Respiratory: Negative.   Cardiovascular: Negative.   Gastrointestinal: Negative.   Genitourinary: Negative.   Neurological: Negative.   Hematological: Negative.     Blood pressure 110/70, pulse 70, resp. rate 16, height 5' 8.5" (1.74 m), weight 147 lb (66.679 kg).  Physical Exam Physical Exam  Constitutional: She appears well-developed and well-nourished. No distress.  HENT:  Head: Normocephalic and atraumatic.  Mouth/Throat: No oropharyngeal exudate.  Eyes: No scleral icterus.  Cardiovascular: Normal rate and regular rhythm.   Pulmonary/Chest: Effort normal and breath sounds normal.  Multiple moles on both breasts. No dominant masses present. No nipple discharge.  Musculoskeletal: She exhibits no edema.  No supraclavicular or axillary adenopathy.  Lymphadenopathy:    She has no cervical adenopathy.  Neurological: She is alert.  Skin: Skin is warm and dry.    Data Reviewed Mammograms  and reports.  Assessment    Suspicious microcalcifications left breast-unable to do image guided biopsy.     Plan    Left breast biopsy after wire localization.  I have explained the procedure, risks, and aftercare to her.  Risks include but are not limited to bleeding, infection, wound problems, cosmetic deformity, anesthesia.  She seems to understand and agrees with the plan.        Deloma Spindle J 04/10/2013, 4:38 PM

## 2013-04-10 NOTE — Patient Instructions (Signed)
[facility] °Office Phone Number [phone] ° °BREAST BIOPSY/ PARTIAL MASTECTOMY: POST OP INSTRUCTIONS ° °Always review your discharge instruction sheet given to you by the facility where your surgery was performed. ° °IF YOU HAVE DISABILITY OR FAMILY LEAVE FORMS, YOU MUST BRING THEM TO THE OFFICE FOR PROCESSING.  DO NOT GIVE THEM TO YOUR DOCTOR. ° °1. A prescription for pain medication may be given to you upon discharge.  Take your pain medication as prescribed, if needed.  If narcotic pain medicine is not needed, then you may take acetaminophen (Tylenol) or ibuprofen (Advil) as needed. °2. Take your usually prescribed medications unless otherwise directed °3. If you need a refill on your pain medication, please contact your pharmacy.  They will contact our office to request authorization.  Prescriptions will not be filled after 5pm or on week-ends. °4. You should eat very light the first 24 hours after surgery, such as soup, crackers, pudding, etc.  Resume your normal diet the day after surgery. °5. Most patients will experience some swelling and bruising in the breast.  Ice packs and a good support bra will help.  Swelling and bruising can take several days to resolve.  °6. It is common to experience some constipation if taking pain medication after surgery.  Increasing fluid intake and taking a stool softener will usually help or prevent this problem from occurring.  A mild laxative (Milk of Magnesia or Miralax) should be taken according to package directions if there are no bowel movements after 48 hours. °7. Unless discharge instructions indicate otherwise, you may remove your bandages 24-48 hours after surgery, and you may shower at that time.  You may have steri-strips (small skin tapes) in place directly over the incision.  These strips should be left on the skin for 7-10 days.  If your surgeon used skin glue on the incision, you may shower in 24 hours.  The glue will flake off over the  next 2-3 weeks.  Any sutures or staples will be removed at the office during your follow-up visit. °8. ACTIVITIES:  You may resume regular daily activities (gradually increasing) beginning the next day.  Wearing a good support bra or sports bra minimizes pain and swelling.  You may have sexual intercourse when it is comfortable. °a. You may drive when you no longer are taking prescription pain medication, you can comfortably wear a seatbelt, and you can safely maneuver your car and apply brakes. °b. RETURN TO WORK:  ______________________________________________________________________________________ °9. You should see your doctor in the office for a follow-up appointment approximately two weeks after your surgery.  Your doctor’s nurse will typically make your follow-up appointment when she calls you with your pathology report.  Expect your pathology report 2-3 business days after your surgery.  You may call to check if you do not hear from us after three days. °10. OTHER INSTRUCTIONS: _______________________________________________________________________________________________ _____________________________________________________________________________________________________________________________________ °_____________________________________________________________________________________________________________________________________ °_____________________________________________________________________________________________________________________________________ ° °WHEN TO CALL YOUR DOCTOR: °1. Fever over 101.0 °2. Nausea and/or vomiting. °3. Extreme swelling or bruising. °4. Continued bleeding from incision. °5. Increased pain, redness, or drainage from the incision. ° °The clinic staff is available to answer your questions during regular business hours.  Please don’t hesitate to call and ask to speak to one of the nurses for clinical concerns.  If you have a medical emergency, go to the nearest  emergency room or call 911.  A surgeon from [facility] is always on call at the hospital. ° °For further questions, please visit [email or web address]  °

## 2013-04-11 ENCOUNTER — Encounter (HOSPITAL_BASED_OUTPATIENT_CLINIC_OR_DEPARTMENT_OTHER): Payer: Self-pay | Admitting: *Deleted

## 2013-04-11 NOTE — Progress Notes (Signed)
To come in for [facility] labs Had echo to r/o MVP 2006-will try to get echo-she was told she did not have MVP after echo

## 2013-04-12 ENCOUNTER — Encounter (HOSPITAL_BASED_OUTPATIENT_CLINIC_OR_DEPARTMENT_OTHER)
Admission: RE | Admit: 2013-04-12 | Discharge: 2013-04-12 | Disposition: A | Discharge status: Home / Self Care | Payer: Medicare Other | Source: Ambulatory Visit | Attending: General Surgery | Admitting: General Surgery

## 2013-04-12 DIAGNOSIS — Z01812 Encounter for preprocedural laboratory examination: Secondary | ICD-10-CM | POA: Insufficient documentation

## 2013-04-12 LAB — DIFFERENTIAL
Basophils Absolute: 0 10*3/uL (ref 0.0–0.1)
Basophils Relative: 1 % (ref 0–1)
EOS ABS: 0.1 10*3/uL (ref 0.0–0.7)
EOS PCT: 2 % (ref 0–5)
Lymphocytes Relative: 31 % (ref 12–46)
Lymphs Abs: 2 10*3/uL (ref 0.7–4.0)
MONOS PCT: 8 % (ref 3–12)
Monocytes Absolute: 0.5 10*3/uL (ref 0.1–1.0)
NEUTROS PCT: 58 % (ref 43–77)
Neutro Abs: 3.7 10*3/uL (ref 1.7–7.7)

## 2013-04-12 LAB — BASIC METABOLIC PANEL
BUN: 15 mg/dL (ref 6–23)
CALCIUM: 9.3 mg/dL (ref 8.4–10.5)
CO2: 25 mEq/L (ref 19–32)
CREATININE: 0.73 mg/dL (ref 0.50–1.10)
Chloride: 102 mEq/L (ref 96–112)
GFR calc Af Amer: >90 mL/min (ref >90)
GFR, EST NON AFRICAN AMERICAN: 86 mL/min — AB (ref >90)
GLUCOSE: 63 mg/dL — AB (ref 70–99)
Potassium: 4.4 mEq/L (ref 3.7–5.3)
Sodium: 140 mEq/L (ref 137–147)

## 2013-04-12 LAB — CBC
HCT: 39.5 % (ref 36.0–46.0)
HEMOGLOBIN: 13.6 g/dL (ref 12.0–15.0)
MCH: 30 pg (ref 26.0–34.0)
MCHC: 34.4 g/dL (ref 30.0–36.0)
MCV: 87.2 fL (ref 78.0–100.0)
PLATELETS: 349 10*3/uL (ref 150–400)
RBC: 4.53 MIL/uL (ref 3.87–5.11)
RDW: 12.8 % (ref 11.5–15.5)
WBC: 6.3 10*3/uL (ref 4.0–10.5)

## 2013-04-12 LAB — PROTIME-INR
INR: 1.04 (ref 0.00–1.49)
Prothrombin Time: 13.4 seconds (ref 11.6–15.2)

## 2013-04-17 ENCOUNTER — Encounter (HOSPITAL_BASED_OUTPATIENT_CLINIC_OR_DEPARTMENT_OTHER)
Admission: RE | Disposition: A | Discharge status: Home / Self Care | Payer: Self-pay | Source: Ambulatory Visit | Attending: General Surgery

## 2013-04-17 ENCOUNTER — Encounter (HOSPITAL_BASED_OUTPATIENT_CLINIC_OR_DEPARTMENT_OTHER): Payer: Self-pay

## 2013-04-17 ENCOUNTER — Ambulatory Visit (HOSPITAL_BASED_OUTPATIENT_CLINIC_OR_DEPARTMENT_OTHER)
Admission: RE | Admit: 2013-04-17 | Discharge: 2013-04-17 | Disposition: A | Discharge status: Home / Self Care | Payer: Medicare Other | Source: Ambulatory Visit | Attending: General Surgery | Admitting: General Surgery

## 2013-04-17 ENCOUNTER — Encounter (HOSPITAL_BASED_OUTPATIENT_CLINIC_OR_DEPARTMENT_OTHER): Payer: Medicare Other | Admitting: Anesthesiology

## 2013-04-17 ENCOUNTER — Ambulatory Visit (HOSPITAL_BASED_OUTPATIENT_CLINIC_OR_DEPARTMENT_OTHER): Payer: Medicare Other | Admitting: Anesthesiology

## 2013-04-17 DIAGNOSIS — R928 Other abnormal and inconclusive findings on diagnostic imaging of breast: Secondary | ICD-10-CM | POA: Insufficient documentation

## 2013-04-17 DIAGNOSIS — K209 Esophagitis, unspecified without bleeding: Secondary | ICD-10-CM | POA: Insufficient documentation

## 2013-04-17 DIAGNOSIS — Z860100 Personal history of colon polyps, unspecified: Secondary | ICD-10-CM | POA: Insufficient documentation

## 2013-04-17 DIAGNOSIS — I2510 Atherosclerotic heart disease of native coronary artery without angina pectoris: Secondary | ICD-10-CM | POA: Insufficient documentation

## 2013-04-17 DIAGNOSIS — K2270 Barrett's esophagus without dysplasia: Secondary | ICD-10-CM | POA: Insufficient documentation

## 2013-04-17 DIAGNOSIS — I10 Essential (primary) hypertension: Secondary | ICD-10-CM | POA: Insufficient documentation

## 2013-04-17 DIAGNOSIS — I4729 Other ventricular tachycardia: Secondary | ICD-10-CM | POA: Insufficient documentation

## 2013-04-17 DIAGNOSIS — N6089 Other benign mammary dysplasias of unspecified breast: Secondary | ICD-10-CM | POA: Insufficient documentation

## 2013-04-17 DIAGNOSIS — D0590 Unspecified type of carcinoma in situ of unspecified breast: Secondary | ICD-10-CM | POA: Insufficient documentation

## 2013-04-17 DIAGNOSIS — I472 Ventricular tachycardia, unspecified: Secondary | ICD-10-CM | POA: Insufficient documentation

## 2013-04-17 DIAGNOSIS — K222 Esophageal obstruction: Secondary | ICD-10-CM | POA: Insufficient documentation

## 2013-04-17 DIAGNOSIS — K219 Gastro-esophageal reflux disease without esophagitis: Secondary | ICD-10-CM | POA: Insufficient documentation

## 2013-04-17 DIAGNOSIS — Z87891 Personal history of nicotine dependence: Secondary | ICD-10-CM | POA: Insufficient documentation

## 2013-04-17 HISTORY — DX: Encounter for fitting and adjustment of spectacles and contact lenses: Z46.0

## 2013-04-17 HISTORY — PX: BREAST BIOPSY: SHX20

## 2013-04-17 HISTORY — DX: Other specified postprocedural states: Z98.890

## 2013-04-17 HISTORY — DX: Other specified postprocedural states: R11.2

## 2013-04-17 HISTORY — DX: Dizziness and giddiness: R42

## 2013-04-17 LAB — POCT HEMOGLOBIN-HEMACUE: Hemoglobin: 13.5 g/dL (ref 12.0–15.0)

## 2013-04-17 SURGERY — BREAST BIOPSY WITH NEEDLE LOCALIZATION
Anesthesia: General | Site: Breast | Laterality: Left

## 2013-04-17 MED ORDER — FENTANYL CITRATE 0.05 MG/ML IJ SOLN: 25.0000 ug | INTRAMUSCULAR | Status: DC | PRN

## 2013-04-17 MED ORDER — DEXAMETHASONE SODIUM PHOSPHATE 4 MG/ML IJ SOLN
INTRAMUSCULAR | Status: DC | PRN
  Administered 2013-04-17: 10 mg via INTRAVENOUS

## 2013-04-17 MED ORDER — LIDOCAINE HCL (PF) 1 % IJ SOLN
INTRAMUSCULAR | Status: AC
  Filled 2013-04-17: qty 30

## 2013-04-17 MED ORDER — CEFAZOLIN SODIUM-DEXTROSE 2-3 GM-% IV SOLR: 2.0000 g | INTRAVENOUS | Status: DC

## 2013-04-17 MED ORDER — FENTANYL CITRATE 0.05 MG/ML IJ SOLN
50.0000 ug | INTRAMUSCULAR | Status: DC | PRN
  Administered 2013-04-17: 50 ug via INTRAVENOUS

## 2013-04-17 MED ORDER — KETOROLAC TROMETHAMINE 30 MG/ML IJ SOLN: 15.0000 mg | Freq: Once | INTRAMUSCULAR | Status: DC | PRN

## 2013-04-17 MED ORDER — FENTANYL CITRATE 0.05 MG/ML IJ SOLN
INTRAMUSCULAR | Status: AC
  Filled 2013-04-17: qty 2

## 2013-04-17 MED ORDER — LIDOCAINE HCL (CARDIAC) 20 MG/ML IV SOLN
INTRAVENOUS | Status: DC | PRN
  Administered 2013-04-17: 60 mg via INTRAVENOUS

## 2013-04-17 MED ORDER — PROPOFOL 10 MG/ML IV BOLUS
INTRAVENOUS | Status: DC | PRN
  Administered 2013-04-17: 110 mg via INTRAVENOUS

## 2013-04-17 MED ORDER — ACETAMINOPHEN 160 MG/5ML PO SOLN: 325.0000 mg | ORAL | Status: DC | PRN

## 2013-04-17 MED ORDER — MIDAZOLAM HCL 2 MG/2ML IJ SOLN
INTRAMUSCULAR | Status: AC
  Filled 2013-04-17: qty 2

## 2013-04-17 MED ORDER — ONDANSETRON HCL 4 MG PO TABS: 4.0000 mg | ORAL_TABLET | ORAL | Status: DC | PRN

## 2013-04-17 MED ORDER — SODIUM BICARBONATE 4 % IV SOLN
INTRAVENOUS | Status: AC
  Filled 2013-04-17: qty 5

## 2013-04-17 MED ORDER — HYDROCODONE-ACETAMINOPHEN 5-325 MG PO TABS: 1.0000 | ORAL_TABLET | ORAL | Status: DC | PRN

## 2013-04-17 MED ORDER — ACETAMINOPHEN 650 MG RE SUPP: 650.0000 mg | RECTAL | Status: DC | PRN

## 2013-04-17 MED ORDER — ACETAMINOPHEN 325 MG PO TABS: 650.0000 mg | ORAL_TABLET | ORAL | Status: DC | PRN

## 2013-04-17 MED ORDER — LACTATED RINGERS IV SOLN
INTRAVENOUS | Status: DC
  Administered 2013-04-17: 14:00:00 via INTRAVENOUS

## 2013-04-17 MED ORDER — OXYCODONE HCL 5 MG PO TABS: 5.0000 mg | ORAL_TABLET | Freq: Once | ORAL | Status: DC | PRN

## 2013-04-17 MED ORDER — BUPIVACAINE HCL (PF) 0.5 % IJ SOLN
INTRAMUSCULAR | Status: DC | PRN
  Administered 2013-04-17: 16 mL

## 2013-04-17 MED ORDER — MIDAZOLAM HCL 2 MG/2ML IJ SOLN
1.0000 mg | INTRAMUSCULAR | Status: DC | PRN
  Administered 2013-04-17: 1 mg via INTRAVENOUS

## 2013-04-17 MED ORDER — FENTANYL CITRATE 0.05 MG/ML IJ SOLN: 12.5000 ug | INTRAMUSCULAR | Status: DC | PRN

## 2013-04-17 MED ORDER — PROPOFOL INFUSION 10 MG/ML OPTIME
INTRAVENOUS | Status: DC | PRN
  Administered 2013-04-17: 200 ug/kg/min via INTRAVENOUS

## 2013-04-17 MED ORDER — BUPIVACAINE HCL (PF) 0.5 % IJ SOLN
INTRAMUSCULAR | Status: AC
  Filled 2013-04-17: qty 30

## 2013-04-17 MED ORDER — ROPIVACAINE HCL 5 MG/ML IJ SOLN
INTRAMUSCULAR | Status: DC | PRN
  Administered 2013-04-17: 20 mL via PERINEURAL

## 2013-04-17 MED ORDER — OXYCODONE HCL 5 MG PO TABS: 5.0000 mg | ORAL_TABLET | ORAL | Status: DC | PRN

## 2013-04-17 MED ORDER — ACETAMINOPHEN 325 MG PO TABS: 325.0000 mg | ORAL_TABLET | ORAL | Status: DC | PRN

## 2013-04-17 MED ORDER — CEFAZOLIN SODIUM 1-5 GM-% IV SOLN
1.0000 g | Freq: Once | INTRAVENOUS | Status: AC
  Administered 2013-04-17: 2 g via INTRAVENOUS

## 2013-04-17 MED ORDER — PROMETHAZINE HCL 25 MG/ML IJ SOLN: 6.2500 mg | INTRAMUSCULAR | Status: DC | PRN

## 2013-04-17 MED ORDER — ONDANSETRON HCL 4 MG/2ML IJ SOLN: 4.0000 mg | Freq: Four times a day (QID) | INTRAMUSCULAR | Status: DC | PRN

## 2013-04-17 MED ORDER — SODIUM CHLORIDE 0.9 % IJ SOLN: 3.0000 mL | INTRAMUSCULAR | Status: DC | PRN

## 2013-04-17 MED ORDER — OXYCODONE HCL 5 MG/5ML PO SOLN: 5.0000 mg | Freq: Once | ORAL | Status: DC | PRN

## 2013-04-17 MED ORDER — FENTANYL CITRATE 0.05 MG/ML IJ SOLN
INTRAMUSCULAR | Status: AC
  Filled 2013-04-17: qty 4

## 2013-04-17 SURGICAL SUPPLY — 50 items
APL SKNCLS STERI-STRIP NONHPOA (GAUZE/BANDAGES/DRESSINGS) ×1
BENZOIN TINCTURE PRP APPL 2/3 (GAUZE/BANDAGES/DRESSINGS) ×3 IMPLANT
BINDER BREAST LRG (GAUZE/BANDAGES/DRESSINGS) IMPLANT
BINDER BREAST MEDIUM (GAUZE/BANDAGES/DRESSINGS) ×2 IMPLANT
BINDER BREAST XLRG (GAUZE/BANDAGES/DRESSINGS) IMPLANT
BINDER BREAST XXLRG (GAUZE/BANDAGES/DRESSINGS) IMPLANT
BLADE SURG 15 STRL LF DISP TIS (BLADE) ×1 IMPLANT
BLADE SURG 15 STRL SS (BLADE) ×3
CANISTER SUCT 1200ML W/VALVE (MISCELLANEOUS) IMPLANT
CHLORAPREP W/TINT 26ML (MISCELLANEOUS) ×3 IMPLANT
CLOSURE WOUND 1/2 X4 (GAUZE/BANDAGES/DRESSINGS) ×1
COVER MAYO STAND STRL (DRAPES) ×3 IMPLANT
COVER TABLE BACK 60X90 (DRAPES) ×3 IMPLANT
DECANTER SPIKE VIAL GLASS SM (MISCELLANEOUS) ×1 IMPLANT
DEVICE DUBIN W/COMP PLATE 8390 (MISCELLANEOUS) ×2 IMPLANT
DRAPE PED LAPAROTOMY (DRAPES) ×3 IMPLANT
DRAPE UTILITY XL STRL (DRAPES) ×3 IMPLANT
ELECT COATED BLADE 2.86 ST (ELECTRODE) ×3 IMPLANT
ELECT REM PT RETURN 9FT ADLT (ELECTROSURGICAL) ×3
ELECTRODE REM PT RTRN 9FT ADLT (ELECTROSURGICAL) ×1 IMPLANT
GLOVE BIOGEL M STRL SZ7.5 (GLOVE) ×2 IMPLANT
GLOVE BIOGEL PI IND STRL 7.0 (GLOVE) IMPLANT
GLOVE BIOGEL PI IND STRL 8 (GLOVE) IMPLANT
GLOVE BIOGEL PI IND STRL 8.5 (GLOVE) ×1 IMPLANT
GLOVE BIOGEL PI INDICATOR 7.0 (GLOVE) ×2
GLOVE BIOGEL PI INDICATOR 8 (GLOVE) ×2
GLOVE BIOGEL PI INDICATOR 8.5 (GLOVE) ×2
GLOVE ECLIPSE 8.0 STRL XLNG CF (GLOVE) ×3 IMPLANT
GOWN STRL REUS W/ TWL LRG LVL3 (GOWN DISPOSABLE) ×1 IMPLANT
GOWN STRL REUS W/ TWL XL LVL3 (GOWN DISPOSABLE) IMPLANT
GOWN STRL REUS W/TWL LRG LVL3 (GOWN DISPOSABLE) ×3
GOWN STRL REUS W/TWL XL LVL3 (GOWN DISPOSABLE) ×3
NDL HYPO 25X1 1.5 SAFETY (NEEDLE) ×1 IMPLANT
NEEDLE HYPO 25X1 1.5 SAFETY (NEEDLE) ×3 IMPLANT
NS IRRIG 1000ML POUR BTL (IV SOLUTION) ×3 IMPLANT
PACK BASIN DAY SURGERY FS (CUSTOM PROCEDURE TRAY) ×3 IMPLANT
PENCIL BUTTON HOLSTER BLD 10FT (ELECTRODE) ×3 IMPLANT
SLEEVE SCD COMPRESS KNEE MED (MISCELLANEOUS) ×2 IMPLANT
SPONGE GAUZE 4X4 12PLY (GAUZE/BANDAGES/DRESSINGS) ×3 IMPLANT
SPONGE GAUZE 4X4 12PLY STER LF (GAUZE/BANDAGES/DRESSINGS) ×3 IMPLANT
STRIP CLOSURE SKIN 1/2X4 (GAUZE/BANDAGES/DRESSINGS) ×2 IMPLANT
SUT MON AB 4-0 PC3 18 (SUTURE) ×3 IMPLANT
SUT SILK 2 0 FS (SUTURE) ×3 IMPLANT
SUT VICRYL 3-0 CR8 SH (SUTURE) ×3 IMPLANT
SYR CONTROL 10ML LL (SYRINGE) ×3 IMPLANT
TOWEL OR 17X24 6PK STRL BLUE (TOWEL DISPOSABLE) ×4 IMPLANT
TOWEL OR NON WOVEN STRL DISP B (DISPOSABLE) ×3 IMPLANT
TUBE CONNECTING 20'X1/4 (TUBING)
TUBE CONNECTING 20X1/4 (TUBING) IMPLANT
YANKAUER SUCT BULB TIP NO VENT (SUCTIONS) IMPLANT

## 2013-04-17 NOTE — Transfer of Care (Signed)
Immediate Anesthesia Transfer of Care Note  Patient: Alison [name]  Procedure(s) Performed: Procedure(s): BREAST BIOPSY WITH NEEDLE LOCALIZATION (Left)  Patient Location: PACU  Anesthesia Type:General and Regional  Level of Consciousness: awake, alert , oriented and patient cooperative  Airway & Oxygen Therapy: Patient Spontanous Breathing and Patient connected to face mask oxygen  Post-op Assessment: Report given to PACU RN and Post -op Vital signs reviewed and stable  Post vital signs: stable  Complications: No apparent anesthesia complications

## 2013-04-17 NOTE — Anesthesia Procedure Notes (Addendum)
Anesthesia Regional Block:  Pectoralis block  Pre-Anesthetic Checklist: ,, timeout performed, Correct Patient, Correct Site, Correct Laterality, Correct Procedure, Correct Position, site marked, Risks and benefits discussed,  Surgical consent,  Pre-op evaluation,  At surgeon's request and post-op pain management  Laterality: Left  Prep: chloraprep       Needles:  Injection technique: Single-shot  Needle Type: Echogenic Stimulator Needle          Additional Needles:  Procedures: ultrasound guided (picture in chart) Pectoralis block Narrative:  Start time: 04/17/2013 2:03 PM End time: 04/17/2013 2:10 PM Injection made incrementally with aspirations every 5 mL.  Performed by: Personally  Anesthesiologist: Ermalene Postin

## 2013-04-17 NOTE — Op Note (Signed)
Operative Note  Alison [name] female 68 y.o. 04/17/2013  PREOPERATIVE DX:  Abnormal microcalcifications left breast  POSTOPERATIVE DX:  Same  PROCEDURE:  Left breast biopsy after wire localization         Surgeon: Odis Hollingshead   Assistants: None  Anesthesia: General mask inhalational anesthesia  Indications:  This is a 68 year old female who has abnormal appearing calcifications in the left breast at the 11 clock position. Image guided biopsy was not able to be performed. She thus presents for the above procedure.    Procedure Detail:  She underwent successful wire localization at the breast imaging center ([facility]).  She was seen in the holding area in the left breast marked with my initials. She was then brought to the operating room placed supine on the operating table and a general anesthetic was given. The bandage on the left breast was removed and the wire was clipped closer to the skin. The left breast and wire were sterilely prepped and draped.  In the upper inner quadrant curvilinear incision is made through the skin and dermis. The wire was then brought into the wound.  The area of concern was 4 cm deep to the skin. I dissected down toward the tip of the wire and then using electrocautery I excised a lump of tissue around the wire down to the area of the chest wall. This tissue was then marked with sutures for orientation. A specimen mammogram was performed and the calcifications were present in the specimen and abutting the medial border.  I discussed this with the radiologist. Thus I went back and took more medial tissue and sent that separately. It was also marked with sutures for orientation.  Both specimens were then sent to pathology.  Local anesthetic was infiltrated into the subcutaneous tissue the wounds. Bleeding was controlled electrocautery. Once hemostasis was adequate the subcutaneous tissue was approximated with interrupted 3-0 Vicryl sutures. The skin was closed  with a 4-0 Monocryl subcuticular stitch. Steri-Strips and a sterile dressing were applied followed by a breast binder.  She tolerated the procedure well without any apparent complications and was taken to the recovery room in satisfactory condition.   Estimated Blood Loss:  less than 100 mL         Drains: none  Blood Given: none          Specimens: Left breast tissue with wire        Complications:  * No complications entered in OR log *         Disposition: PACU - hemodynamically stable.         Condition: stable

## 2013-04-17 NOTE — Discharge Instructions (Addendum)
[place] Office Phone Number [phone]  BREAST BIOPSY/ PARTIAL MASTECTOMY: POST OP INSTRUCTIONS  Always review your discharge instruction sheet given to you by the facility where your surgery was performed.  IF YOU HAVE DISABILITY OR FAMILY LEAVE FORMS, YOU MUST BRING THEM TO THE OFFICE FOR PROCESSING.  DO NOT GIVE THEM TO YOUR DOCTOR.  1. A prescription for pain medication may be given to you upon discharge.  Take your pain medication as prescribed, if needed.  If narcotic pain medicine is not needed, then you may take acetaminophen (Tylenol) or ibuprofen (Advil) as needed. 2. Take your usually prescribed medications unless otherwise directed 3. If you need a refill on your pain medication, please contact your pharmacy.  They will contact our office to request authorization.  Prescriptions will not be filled after 5pm or on week-ends. 4. You should eat very light the first 24 hours after surgery, such as soup, crackers, pudding, etc.  Resume your normal diet the day after surgery. 5. Most patients will experience some swelling and bruising in the breast.  Ice packs and a good support bra will help.  Swelling and bruising can take several days to resolve.  6. It is common to experience some constipation if taking pain medication after surgery.  Increasing fluid intake and taking a stool softener will usually help or prevent this problem from occurring.  A mild laxative (Milk of Magnesia or Miralax) should be taken according to package directions if there are no bowel movements after 48 hours. 7. Unless discharge instructions indicate otherwise, you may remove your bandages 48 hours after surgery, and you may shower at that time.  You may have steri-strips (small skin tapes) in place directly over the incision.  These strips should be left on the skin for 14 days.  If your surgeon used skin glue on the incision, you may shower in 24 hours.  The glue will flake off over the next  2-3 weeks.  Any sutures or staples will be removed at the office during your follow-up visit. 8. ACTIVITIES:  You may resume regular daily activities (gradually increasing) in 2-3 days or when pain-free.  Wearing a good support bra or sports bra minimizes pain and swelling.  You may have sexual intercourse when it is comfortable. a. You may drive when you no longer are taking prescription pain medication, you can comfortably wear a seatbelt, and you can safely maneuver your car and apply brakes. b. RETURN TO WORK:  ______________________________________________________________________________________ 9. You should see your doctor in the office for a follow-up appointment approximately two weeks after your surgery.  Your doctors nurse will typically make your follow-up appointment when she calls you with your pathology report.  Expect your pathology report 2-3 business days after your surgery.  You may call to check if you do not hear from [place] after three days. 10. OTHER INSTRUCTIONS: _______________________________________________________________________________________________ _____________________________________________________________________________________________________________________________________ _____________________________________________________________________________________________________________________________________ _____________________________________________________________________________________________________________________________________  WHEN TO CALL YOUR DOCTOR: 1. Fever over 101.0 2. Nausea and/or vomiting. 3. Extreme swelling or bruising. 4. Continued bleeding from incision. 5. Increased pain, redness, or drainage from the incision.  The clinic staff is available to answer your questions during regular business hours.  Please dont hesitate to call and ask to speak to one of the nurses for clinical concerns.  If you have a medical emergency, go to the nearest  emergency room or call 911.  A surgeon from [facility] is always on call at the hospital.  For further questions, please visit  [email or web address]  Post Anesthesia Home Care Instructions  Activity: Get plenty of rest for the remainder of the day. A responsible adult should stay with you for 24 hours following the procedure.  For the next 24 hours, DO NOT: -Drive a car -Paediatric nurse -Drink alcoholic beverages -Take any medication unless instructed by your physician -Make any legal decisions or sign important papers.  Meals: Start with liquid foods such as gelatin or soup. Progress to regular foods as tolerated. Avoid greasy, spicy, heavy foods. If nausea and/or vomiting occur, drink only clear liquids until the nausea and/or vomiting subsides. Call your physician if vomiting continues.  Special Instructions/Symptoms: Your throat may feel dry or sore from the anesthesia or the breathing tube placed in your throat during surgery. If this causes discomfort, gargle with warm salt water. The discomfort should disappear within 24 hours.

## 2013-04-17 NOTE — Interval H&P Note (Signed)
History and Physical Interval Note:  04/17/2013 1:36 PM  Alison [name]  has presented today for surgery, with the diagnosis of left breast microcalcifications   The various methods of treatment have been discussed with the patient and family. After consideration of risks, benefits and other options for treatment, the patient has consented to  Procedure(s): BREAST BIOPSY WITH NEEDLE LOCALIZATION (Left) as a surgical intervention .  The patient's history has been reviewed, patient examined, no change in status, stable for surgery.  I have reviewed the patient's chart and labs.  Questions were answered to the patient's satisfaction.     Ardit Danh Lenna Sciara

## 2013-04-17 NOTE — H&P (View-Only) (Signed)
Patient ID: Alison [name], female   DOB: 09/28/1945, 67 y.o.   MRN: [id number]  Chief Complaint  Patient presents with  . Other    Eval lt br calcifications    HPI Alison [name] is a 67 y.o. female.   HPI  She is referred by Dr. Margaret Bertran for further evaluation and treatment of left breast calcifications at the 11:00 position. These were suspicious calcifications. They measure approximately 1.1 cm. Stereotactic biopsy was attempted but could not be performed. There is no family history of breast cancer. Age at menarche was 11. Age at menopause was 49. She is nulliparous.  Denies breast masses or breast pain. Denies nipple discharge.  Past Medical History  Diagnosis Date  . GERD (gastroesophageal reflux disease)   . Internal hemorrhoids   . Esophagitis   . 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 H/O adenomatous polyp of colon   . Esophageal stricture   . Arthritis     Past Surgical History  Procedure Laterality Date  . Ovarian cyst removal    . Tonsillectomy    . Total hip arthroplasty    . Nissen fundoplication      Family History  Problem Relation Age of Onset  . Heart attack Father   . Lung cancer Mother     Social History History  Substance Use Topics  . Smoking status: Former Smoker -- 5 years    Types: Cigarettes    Quit date: 08/10/1977  . Smokeless tobacco: Never Used     Comment: She smoked for 5 years in her early 30s.    . Alcohol Use: Yes    Allergies  Allergen Reactions  . Morphine   . Sulfonamide Derivatives     Current Outpatient Prescriptions  Medication Sig Dispense Refill  . meclizine (ANTIVERT) 25 MG tablet Take 1 tablet (25 mg total) by mouth 3 (three) times daily as needed for dizziness.  90 tablet  1  . metoprolol tartrate (LOPRESSOR) 25 MG tablet Take 1 tablet (25 mg total) by mouth 2 (two) times daily.  180 tablet  3  . omeprazole (PRILOSEC) 20 MG capsule Take 1 capsule (20 mg total) by mouth 2 (two) times daily.  180 capsule  3  .  SUMAtriptan (IMITREX) 20 MG/ACT nasal spray Place 1 spray (20 mg total) into the nose every 2 (two) hours as needed for migraine.  8 Inhaler  11  . triamterene-hydrochlorothiazide (MAXZIDE-25) 37.5-25 MG per tablet TAKE 1 TABLET(S) BY MOUTH EVERY MORNING  30 tablet  0   No current facility-administered medications for this visit.    Review of Systems Review of Systems  Constitutional: Negative.   Respiratory: Negative.   Cardiovascular: Negative.   Gastrointestinal: Negative.   Genitourinary: Negative.   Neurological: Negative.   Hematological: Negative.     Blood pressure 110/70, pulse 70, resp. rate 16, height 5' 8.5" (1.74 m), weight 147 lb (66.679 kg).  Physical Exam Physical Exam  Constitutional: She appears well-developed and well-nourished. No distress.  HENT:  Head: Normocephalic and atraumatic.  Mouth/Throat: No oropharyngeal exudate.  Eyes: No scleral icterus.  Cardiovascular: Normal rate and regular rhythm.   Pulmonary/Chest: Effort normal and breath sounds normal.  Multiple moles on both breasts. No dominant masses present. No nipple discharge.  Musculoskeletal: She exhibits no edema.  No supraclavicular or axillary adenopathy.  Lymphadenopathy:    She has no cervical adenopathy.  Neurological: She is alert.  Skin: Skin is warm and dry.    Data Reviewed Mammograms   and reports.  Assessment    Suspicious microcalcifications left breast-unable to do image guided biopsy.     Plan    Left breast biopsy after wire localization.  I have explained the procedure, risks, and aftercare to her.  Risks include but are not limited to bleeding, infection, wound problems, cosmetic deformity, anesthesia.  She seems to understand and agrees with the plan.        Karmin Kasprzak J 04/10/2013, 4:38 PM

## 2013-04-17 NOTE — Anesthesia Preprocedure Evaluation (Addendum)
Anesthesia Evaluation  Patient identified by MRN, date of birth, ID band Patient awake    Reviewed: Allergy & Precautions, H&P , NPO status , Patient's Chart, lab work & pertinent test results  History of Anesthesia Complications (+) PONV and history of anesthetic complications  Airway Mallampati: II TM Distance: >3 FB Neck ROM: Full    Dental  (+) Teeth Intact   Pulmonary neg shortness of breath, neg sleep apnea, neg COPDneg recent URI, former smoker,    Pulmonary exam normal       Cardiovascular Exercise Tolerance: Good hypertension, Pt. on medications and Pt. on home beta blockers - angina- CAD + dysrhythmias Supra Ventricular Tachycardia - Valvular Problems/MurmursRhythm:Regular Rate:Normal     Neuro/Psych  Headaches, negative psych ROS   GI/Hepatic Neg liver ROS, GERD-  Medicated and Controlled,  Endo/Other  negative endocrine ROS  Renal/GU negative Renal ROS     Musculoskeletal negative musculoskeletal ROS (+)   Abdominal   Peds  Hematology negative hematology ROS (+)   Anesthesia Other Findings   Reproductive/Obstetrics                        Anesthesia Physical Anesthesia Plan  ASA: II  Anesthesia Plan: General and Regional   Post-op Pain Management:    Induction: Intravenous  Airway Management Planned: LMA  Additional Equipment: None  Intra-op Plan:   Post-operative Plan: Extubation in OR  Informed Consent: I have reviewed the patients History and Physical, chart, labs and discussed the procedure including the risks, benefits and alternatives for the proposed anesthesia with the patient or authorized representative who has indicated his/her understanding and acceptance.   Dental advisory given  Plan Discussed with: CRNA and Surgeon  Anesthesia Plan Comments:        Anesthesia Quick Evaluation

## 2013-04-17 NOTE — Anesthesia Postprocedure Evaluation (Signed)
  Anesthesia Post-op Note  Patient: Alison [name]  Procedure(s) Performed: Procedure(s): BREAST BIOPSY WITH NEEDLE LOCALIZATION (Left)  Patient Location: PACU  Anesthesia Type:General and Regional  Level of Consciousness: awake, alert  and oriented  Airway and Oxygen Therapy: Patient Spontanous Breathing  Post-op Pain: none  Post-op Assessment: Post-op Vital signs reviewed, Patient's Cardiovascular Status Stable, Respiratory Function Stable, Patent Airway, No signs of Nausea or vomiting and Pain level controlled  Post-op Vital Signs: Reviewed and stable  Complications: No apparent anesthesia complications

## 2013-04-17 NOTE — Progress Notes (Signed)
Assisted dr Ermalene Postin  with left, ultrasound guided, pectoralis block. Side rails up, monitors on throughout procedure. See vital signs in flow sheet. Tolerated Procedure well.

## 2013-04-18 ENCOUNTER — Encounter (HOSPITAL_BASED_OUTPATIENT_CLINIC_OR_DEPARTMENT_OTHER): Payer: Self-pay | Admitting: General Surgery

## 2013-04-20 ENCOUNTER — Encounter: Payer: Self-pay | Admitting: *Deleted

## 2013-04-20 ENCOUNTER — Other Ambulatory Visit (INDEPENDENT_AMBULATORY_CARE_PROVIDER_SITE_OTHER): Payer: Self-pay | Admitting: General Surgery

## 2013-04-20 ENCOUNTER — Encounter (INDEPENDENT_AMBULATORY_CARE_PROVIDER_SITE_OTHER): Payer: Self-pay | Admitting: General Surgery

## 2013-04-20 DIAGNOSIS — D0510 Intraductal carcinoma in situ of unspecified breast: Secondary | ICD-10-CM

## 2013-04-20 NOTE — Progress Notes (Signed)
Patient ID: Alison [name], female   DOB: 02-11-1946, 68 y.o.   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 Margins are not involved. Explained this to her. I recommended she see a medical oncologist and radiation oncologist to discuss further treatment and she is in agreement with that. We will make the referral.

## 2013-04-20 NOTE — Progress Notes (Signed)
Received referral in EPIC and gave paperwork to Dr. [name] for an appt date and time.  Emailed Bernie at [employer] to make her aware.

## 2013-04-21 ENCOUNTER — Telehealth: Payer: Self-pay | Admitting: *Deleted

## 2013-04-21 NOTE — Telephone Encounter (Signed)
Received appt date and time from Dr. [name] and I called and confirmed 05/12/13 appt w/ pt.  Emailed Santiago Glad in [place] to make aware.  Emailed Bernie at [employer] to make aware.

## 2013-04-24 ENCOUNTER — Telehealth: Payer: Self-pay | Admitting: *Deleted

## 2013-04-24 NOTE — Telephone Encounter (Signed)
Mailed before appt letter, welcome packet & intake form to pt.  Took paperwork to Med Rec for chart.

## 2013-04-27 ENCOUNTER — Encounter (INDEPENDENT_AMBULATORY_CARE_PROVIDER_SITE_OTHER): Payer: Self-pay

## 2013-05-01 ENCOUNTER — Ambulatory Visit (INDEPENDENT_AMBULATORY_CARE_PROVIDER_SITE_OTHER): Payer: Medicare Other | Admitting: General Surgery

## 2013-05-01 ENCOUNTER — Encounter (INDEPENDENT_AMBULATORY_CARE_PROVIDER_SITE_OTHER): Payer: Self-pay | Admitting: General Surgery

## 2013-05-01 VITALS — BP 118/76 | HR 80 | Temp 97.8°F | Resp 14 | Ht 68.5 in | Wt 148.8 lb

## 2013-05-01 DIAGNOSIS — D0590 Unspecified type of carcinoma in situ of unspecified breast: Secondary | ICD-10-CM

## 2013-05-01 DIAGNOSIS — D0510 Intraductal carcinoma in situ of unspecified breast: Secondary | ICD-10-CM

## 2013-05-01 NOTE — Patient Instructions (Signed)
Activities as tolerated. May use Mederma on the scar.

## 2013-05-01 NOTE — Progress Notes (Signed)
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 Estrogen and progesterone receptor positive.  History:  She is here for her first postoperative visit. She has no complaints. She had minimal to no pain.  Exam: General- Is in NAD. Left breast-superior incision is clean and intact. Steri-Strips are present.  Assessment:  Left breast ductal carcinoma in situ. Referrals have been made to medical and radiation oncology. We discussed the pathology in detail. Her questions were answered.  Plan:  Return visit in 3 months.

## 2013-05-03 ENCOUNTER — Ambulatory Visit
Admission: RE | Admit: 2013-05-03 | Discharge: 2013-05-03 | Disposition: A | Discharge status: Home / Self Care | Payer: Medicare Other | Source: Ambulatory Visit | Attending: Radiation Oncology | Admitting: Radiation Oncology

## 2013-05-03 ENCOUNTER — Encounter: Payer: Self-pay | Admitting: Radiation Oncology

## 2013-05-03 VITALS — BP 105/65 | HR 76 | Temp 98.8°F

## 2013-05-03 DIAGNOSIS — Z87891 Personal history of nicotine dependence: Secondary | ICD-10-CM | POA: Insufficient documentation

## 2013-05-03 DIAGNOSIS — Z79899 Other long term (current) drug therapy: Secondary | ICD-10-CM | POA: Insufficient documentation

## 2013-05-03 DIAGNOSIS — C50219 Malignant neoplasm of upper-inner quadrant of unspecified female breast: Secondary | ICD-10-CM

## 2013-05-03 DIAGNOSIS — R921 Mammographic calcification found on diagnostic imaging of breast: Secondary | ICD-10-CM

## 2013-05-03 DIAGNOSIS — K219 Gastro-esophageal reflux disease without esophagitis: Secondary | ICD-10-CM | POA: Insufficient documentation

## 2013-05-03 DIAGNOSIS — D0590 Unspecified type of carcinoma in situ of unspecified breast: Secondary | ICD-10-CM | POA: Insufficient documentation

## 2013-05-03 NOTE — Progress Notes (Signed)
Please see the Nurse Progress Note in the MD Initial Consult Encounter for this patient. 

## 2013-05-03 NOTE — Progress Notes (Signed)
Location of Breast Cancer:Left Breast ductal carcinoma in situ at 11 o'clock position.  Histology per Pathology Report: 04/17/2013 Breast, lumpectomy, Left - DUCTAL CARCINOMA IN SITU WITH CALCIFICATION. - MARGINS NOT INVOLVED. - CLOSED MARGIN INFERIOR AT 0.1 CM. - ATYPICAL LOBULAR HYPERPLASIA. - FIBROCYSTIC CHANGES WITH CALCIFICATIONS. 1 of 3 FINAL for Bonenberger, Miara R [phone]) Diagnosis(continued) 2. Breast, excision, Left, additional medial margin - FIBROCYSTIC CHANGES WITH CALCIFICATIONS. - ATYPICAL LOBULAR HYPERPLASIA. - FINAL MARGIN CLEAR. Microscopic Comment 1. BREAST, IN SITU CARCINOMA  Receptor Status: ER(+), PR (+), Her2-neu   Did patient present with symptoms (if so, please note symptoms) or was this found on screening mammography?:round on mammogram on 03/23/2013.  Past/Anticipated interventions by surgeon, if OHF:GBMS breast biopsy after wire localization on 04/17/13.Left breast microcalcifications by Dr.Todd Rosenbower.  Past/Anticipated interventions by medical oncology, if any: New Consultation with Dr.Magrinat on 05/12/13.  Lymphedema issues, if any:No  Pain issues, if any:No  SAFETY ISSUES:  Prior radiation? No  Pacemaker/ICD?No   Possible current pregnancy?No  Is the patient on methotrexate?No  Current Complaints / other details:Retired PE teacher Here with significant other Graylon Good age 68, menopause age 47.Nulliparous.No family history of breast cancer.    Arlyss Repress, RN 05/03/2013,11:05 AM

## 2013-05-04 DIAGNOSIS — C50219 Malignant neoplasm of upper-inner quadrant of unspecified female breast: Secondary | ICD-10-CM | POA: Insufficient documentation

## 2013-05-04 NOTE — Progress Notes (Addendum)
Radiation Oncology         [phone]) [phone] ________________________________  Initial outpatient Consultation - Date: 05/03/2013   Name: Alison [name] MRN: [id number]   DOB: [birth year]  REFERRING PHYSICIAN: Odis Hollingshead, MD  DIAGNOSIS: DCIS of the left breast (Stage 0)  HISTORY OF PRESENT ILLNESS::Alison [name] is a 68 y.o. female  underwent a screening mammogram. This showed a new area of calcifications. The calcifications were extremely posterior and difficult to see. A stereotactic biopsy cannot be performed. Excisional biopsy was recommended. She had this performed on 04/17/2013. This showed ductal carcinoma in situ with calcifications. This was associated with atypical lobular hyperplasia and fibrocystic changes with calcifications as well. Margins were not involved. An inferior margin was close at 0.1 cm. Additional margin was taken and Dr. Zella Richer believes this is actually much greater than that. This was intermediate grade and involves an area of 2.5 cm. This was ER positive at 100% and PR positive at 97%. She is healed up well from her surgery.Marland Kitchen  PREVIOUS RADIATION THERAPY: No  PAST MEDICAL HISTORY:  has a past medical history of GERD (gastroesophageal reflux disease); Internal hemorrhoids; Esophagitis; Barrett esophagus (03/25/07); H/O adenomatous polyp of colon; Esophageal stricture; Arthritis; PONV (postoperative nausea and vomiting); Contact lens/glasses fitting; and Vertigo.    PAST SURGICAL HISTORY: Past Surgical History  Procedure Laterality Date  . Ovarian cyst removal    . Tonsillectomy    . Total hip arthroplasty  2005    right  . Nissen fundoplication  9767  . Upper gi endoscopy    . Colonoscopy    . Breast biopsy Left 04/17/2013    Procedure: BREAST BIOPSY WITH NEEDLE LOCALIZATION;  Surgeon: Odis Hollingshead, MD;  Location: [facility];  Service: General;  Laterality: Left;    FAMILY HISTORY:  Family History  Problem Relation Age of Onset  .  Heart attack Father   . Lung cancer Mother     SOCIAL HISTORY:  History  Substance Use Topics  . Smoking status: Former Smoker -- 5 years    Types: Cigarettes    Quit date: 08/10/1977  . Smokeless tobacco: Never Used     Comment: She smoked for 5 years in her early 62s.    . Alcohol Use: Yes    ALLERGIES: Adhesive; Morphine; and Sulfonamide derivatives  MEDICATIONS:  Current Outpatient Prescriptions  Medication Sig Dispense Refill  . meclizine (ANTIVERT) 25 MG tablet Take 1 tablet (25 mg total) by mouth 3 (three) times daily as needed for dizziness.  90 tablet  1  . metoprolol tartrate (LOPRESSOR) 25 MG tablet Take 25 mg by mouth 2 (two) times daily. Takes for occ rapid hr      . omeprazole (PRILOSEC) 20 MG capsule Take 1 capsule (20 mg total) by mouth 2 (two) times daily.  180 capsule  3  . ondansetron (ZOFRAN) 4 MG tablet Take 1 tablet (4 mg total) by mouth every 4 (four) hours as needed for nausea or vomiting.  20 tablet  0  . SUMAtriptan (IMITREX) 20 MG/ACT nasal spray Place 1 spray (20 mg total) into the nose every 2 (two) hours as needed for migraine.  8 Inhaler  11  . triamterene-hydrochlorothiazide (MAXZIDE-25) 37.5-25 MG per tablet TAKE 1 TABLET(S) BY MOUTH EVERY MORNING-takes for fluid and vertigo       No current facility-administered medications for this encounter.    REVIEW OF SYSTEMS:  A 15 point review of systems is documented in the electronic  medical record. This was obtained by the nursing staff. However, I reviewed this with the patient to discuss relevant findings and make appropriate changes.  Pertinent items are noted in HPI.  PHYSICAL EXAM:  Filed Vitals:   05/03/13 1442  BP: 105/65  Pulse: 76  Temp: 98.8 F (37.1 C)  Pleasant female.  Appears younger than her stated age.  No distress. No lymphedema. Alert and oriented x 3.   LABORATORY DATA:  Lab Results  Component Value Date   WBC 6.3 04/12/2013   HGB 13.5 04/17/2013   HCT 39.5 04/12/2013   MCV 87.2  04/12/2013   PLT 349 04/12/2013   Lab Results  Component Value Date   NA 140 04/12/2013   K 4.4 04/12/2013   CL 102 04/12/2013   CO2 25 04/12/2013   Lab Results  Component Value Date   ALT 12 07/18/2012   AST 14 07/18/2012   ALKPHOS 74 07/18/2012   BILITOT 0.8 07/18/2012     RADIOGRAPHY: No results found.    IMPRESSION: 68 yo s/p lumpectomy for DCIS with negative margins  PLAN: I spoke with Alison [name] and her partner today regarding the role of radiation and decreasing local failures in patients who undergo lumpectomy for DCIS. We discussed the process of simulation the placement tattoos. We discussed 4-6 weeks of treatment as an outpatient. We discussed the use of breath hold technique for cardiac sparing. We discussed the low likelihood of symptomatic lung or rib damage. We discussed the low likelihood of secondary malignancies. She is working in [employer] until April and would like to either have her treatment in [employer] or begin her treatment after market. I think you would be fine to delay until the first week of April. I've encouraged her to call me and have written down the number for simulation so that she can: Get her simulation scheduled a week or so before she is ready to start. We discussed skin redness and fatigue as major side effects during treatment. She has signed informed consent and agree to proceed forward. Due to the posterior location of her calcifications and the infant had to do 2 to get these visualized I don't think she is up for a preradiation mammogram. She did have widely negative margins so we will proceed without one. She has an appointment with Dr. [name] on 05/12/13 to discuss antiestrogen therapy.   I spent 40 minutes  face to face with the patient and more than 50% of that time was spent in counseling and/or coordination of care.   ------------------------------------------------  Thea Silversmith, MD

## 2013-05-04 NOTE — Addendum Note (Signed)
Encounter addended by: Thea Silversmith, MD on: 05/04/2013 12:54 PM<BR>     Documentation filed: Notes Section

## 2013-05-05 ENCOUNTER — Ambulatory Visit
Admission: RE | Admit: 2013-05-05 | Discharge: 2013-05-05 | Disposition: A | Discharge status: Home / Self Care | Payer: Medicare Other | Source: Ambulatory Visit | Attending: Radiation Oncology | Admitting: Radiation Oncology

## 2013-05-05 DIAGNOSIS — D0590 Unspecified type of carcinoma in situ of unspecified breast: Secondary | ICD-10-CM | POA: Insufficient documentation

## 2013-05-05 DIAGNOSIS — C50219 Malignant neoplasm of upper-inner quadrant of unspecified female breast: Secondary | ICD-10-CM

## 2013-05-05 DIAGNOSIS — Z510 Encounter for antineoplastic radiation therapy: Secondary | ICD-10-CM | POA: Insufficient documentation

## 2013-05-05 NOTE — Progress Notes (Addendum)
Name: ARYAH DOERING   MRN: [id number]  Date:  05/05/2013  DOB: 1946-02-08  Status:outpatient    DIAGNOSIS: Breast cancer.  CONSENT VERIFIED: yes   SET UP: Patient is setup supine   IMMOBILIZATION:  The following immobilization was used:Custom Moldable Pillow, breast board.   NARRATIVE: Ms. Kemmer was brought to the [facility].  Identity was confirmed.  All relevant records and images related to the planned course of therapy were reviewed.  Then, the patient was positioned in a stable reproducible clinical set-up for radiation therapy.  Wires were placed to delineate the clinical extent of breast tissue. A wire was placed on the scar as well.  CT images were obtained.  An isocenter was placed. Skin markings were placed.  The CT images were loaded into the planning software where the target and avoidance structures were contoured.  The radiation prescription was entered and confirmed. The patient was discharged in stable condition and tolerated simulation well.    TREATMENT PLANNING NOTE:  Treatment planning then occurred. I have requested : MLC's, isodose plan, basic dose calculation  3D simulation is requested and performed. I have requested a DVH of the following critical structures: lungs, heart and lumpectomy cavity.   I personally designed and supervised the construction of 3 medically necessary complex treatment devices for the protection of critical normal structures including the lungs and contralateral breast as well as the immobilization device which is necessary for set up certainty.

## 2013-05-05 NOTE — Progress Notes (Signed)
Radiation Oncology         [phone]) [phone] ________________________________  Name: Alison [name]      MRN: [id number]          Date: 05/05/2013              DOB: 06-Apr-1945  Optical Surface Tracking Plan:  Since intensity modulated radiotherapy (IMRT) and 3D conformal radiation treatment methods are predicated on accurate and precise positioning for treatment, intrafraction motion monitoring is medically necessary to ensure accurate and safe treatment delivery.  The ability to quantify intrafraction motion without excessive ionizing radiation dose can only be performed with optical surface tracking. Accordingly, surface imaging offers the opportunity to obtain 3D measurements of patient position throughout IMRT and 3D treatments without excessive radiation exposure.  I am ordering optical surface tracking for this patient's upcoming course of radiotherapy. ________________________________ Signature   Reference:   Ursula Alert, J, et al. Surface imaging-based analysis of intrafraction motion for breast radiotherapy patients.Journal of [place], n. 6, nov. 2014. ISSN [id number].   Available at: <http://www.jacmp.org/index.php/jacmp/article/view/4957>.

## 2013-05-08 ENCOUNTER — Telehealth: Payer: Self-pay | Admitting: *Deleted

## 2013-05-08 NOTE — Telephone Encounter (Signed)
Pt called stating that she wants to cancel her Med Onc appt for 3/6 to get through her Rad Onc treatments.  She will call back later in March to reschedule for when she is almost done.  Cancelled appt as requested and placed a note on chart.  Emailed Bernie at [employer] to make aware.

## 2013-05-08 NOTE — Telephone Encounter (Signed)
Pt called and left me a message about wanting to cancel her appt for 3/6, but didn't say why.  I called and left her a message to see if she was going elsewhere or wanted to reschedule for a later date and requested for her to call me back.

## 2013-05-12 ENCOUNTER — Other Ambulatory Visit: Payer: Medicare Other

## 2013-05-12 ENCOUNTER — Other Ambulatory Visit: Payer: Self-pay | Admitting: Family Medicine

## 2013-05-12 ENCOUNTER — Ambulatory Visit: Payer: Medicare Other | Admitting: Radiation Oncology

## 2013-05-12 ENCOUNTER — Ambulatory Visit: Payer: Medicare Other | Admitting: Oncology

## 2013-05-12 ENCOUNTER — Ambulatory Visit: Payer: Medicare Other

## 2013-05-15 ENCOUNTER — Ambulatory Visit
Admission: RE | Admit: 2013-05-15 | Discharge: 2013-05-15 | Disposition: A | Discharge status: Home / Self Care | Payer: Medicare Other | Source: Ambulatory Visit | Attending: Radiation Oncology | Admitting: Radiation Oncology

## 2013-05-15 ENCOUNTER — Ambulatory Visit: Payer: Medicare Other | Admitting: Radiation Oncology

## 2013-05-15 ENCOUNTER — Ambulatory Visit: Payer: Medicare Other

## 2013-05-15 DIAGNOSIS — C50219 Malignant neoplasm of upper-inner quadrant of unspecified female breast: Secondary | ICD-10-CM

## 2013-05-15 NOTE — Progress Notes (Signed)
  Radiation Oncology         ([phone]) [phone] ________________________________  Name: Alison [name] MRN: [id number]  Date: 05/15/2013  DOB: [birth year]  Simulation Verification Note  Status: outpatient  NARRATIVE: The patient was brought to the treatment unit and placed in the planned treatment position. The clinical setup was verified. Then port films were obtained and uploaded to the radiation oncology medical record software.  The treatment beams were carefully compared against the planned radiation fields. The position location and shape of the radiation fields was reviewed. They targeted volume of tissue appears to be appropriately covered by the radiation beams. Organs at risk appear to be excluded as planned.  Based on my personal review, I approved the simulation verification. The patient's treatment will proceed as planned.  -----------------------------------  Blair Promise, PhD, MD

## 2013-05-16 ENCOUNTER — Ambulatory Visit: Admission: RE | Admit: 2013-05-16 | Payer: Medicare Other | Source: Ambulatory Visit | Admitting: Radiation Oncology

## 2013-05-16 ENCOUNTER — Ambulatory Visit: Payer: Medicare Other

## 2013-05-16 ENCOUNTER — Ambulatory Visit
Admission: RE | Admit: 2013-05-16 | Discharge: 2013-05-16 | Disposition: A | Discharge status: Home / Self Care | Payer: Medicare Other | Source: Ambulatory Visit | Attending: Radiation Oncology | Admitting: Radiation Oncology

## 2013-05-17 ENCOUNTER — Ambulatory Visit
Admission: RE | Admit: 2013-05-17 | Discharge: 2013-05-17 | Disposition: A | Discharge status: Home / Self Care | Payer: Medicare Other | Source: Ambulatory Visit | Attending: Radiation Oncology | Admitting: Radiation Oncology

## 2013-05-17 ENCOUNTER — Ambulatory Visit: Payer: Medicare Other

## 2013-05-18 ENCOUNTER — Ambulatory Visit
Admission: RE | Admit: 2013-05-18 | Discharge: 2013-05-18 | Disposition: A | Discharge status: Home / Self Care | Payer: Medicare Other | Source: Ambulatory Visit | Attending: Radiation Oncology | Admitting: Radiation Oncology

## 2013-05-18 ENCOUNTER — Ambulatory Visit: Payer: Medicare Other

## 2013-05-19 ENCOUNTER — Ambulatory Visit: Payer: Medicare Other

## 2013-05-22 ENCOUNTER — Ambulatory Visit: Payer: Medicare Other

## 2013-05-22 ENCOUNTER — Ambulatory Visit
Admission: RE | Admit: 2013-05-22 | Discharge: 2013-05-22 | Disposition: A | Discharge status: Home / Self Care | Payer: Medicare Other | Source: Ambulatory Visit | Attending: Radiation Oncology | Admitting: Radiation Oncology

## 2013-05-23 ENCOUNTER — Ambulatory Visit: Payer: Medicare Other

## 2013-05-23 ENCOUNTER — Encounter (INDEPENDENT_AMBULATORY_CARE_PROVIDER_SITE_OTHER): Payer: Self-pay

## 2013-05-23 ENCOUNTER — Ambulatory Visit
Admission: RE | Admit: 2013-05-23 | Discharge: 2013-05-23 | Disposition: A | Discharge status: Home / Self Care | Payer: Medicare Other | Source: Ambulatory Visit | Attending: Radiation Oncology | Admitting: Radiation Oncology

## 2013-05-23 VITALS — BP 106/66 | HR 68 | Temp 98.7°F | Wt 150.4 lb

## 2013-05-23 DIAGNOSIS — C50219 Malignant neoplasm of upper-inner quadrant of unspecified female breast: Secondary | ICD-10-CM

## 2013-05-23 MED ORDER — ALRA NON-METALLIC DEODORANT (RAD-ONC)
1.0000 "application " | Freq: Once | TOPICAL | Status: AC
  Administered 2013-05-23: 1 via TOPICAL

## 2013-05-23 MED ORDER — RADIAPLEXRX EX GEL
Freq: Once | CUTANEOUS | Status: AC
  Administered 2013-05-23: 12:00:00 via TOPICAL

## 2013-05-23 NOTE — Addendum Note (Signed)
Encounter addended by: Arlyss Repress, RN on: 05/23/2013 11:36 AM<BR>     Documentation filed: Orders

## 2013-05-23 NOTE — Progress Notes (Signed)
Patient for weekly assessment of radiation to left [employer].ch 5 of 17.No skin changes.Routine of clinic reviewed.given Radiation Therapy and You booklet, and skin care sheet.Shown area to apply radiaplex.will go into further detail after doctor visit.Has some vertigo which she has history of.Instructed to make sure to move from lying to sitting position before getting off treatment table, and to take medication that she already has.

## 2013-05-23 NOTE — Addendum Note (Signed)
Encounter addended by: Iokepa Geffre Marie Teliyah Royal, RN on: 05/23/2013 11:55 AM<BR>     Documentation filed: Inpatient MAR

## 2013-05-23 NOTE — Progress Notes (Signed)
Weekly Management Note Current Dose:  12.5 Gy  Projected Dose: 42.5 Gy   Narrative:  The patient presents for routine under treatment assessment.  CBCT/MVCT images/Port film x-rays were reviewed.  The chart was checked. Doing well. Vertigo when getting off treatment table. No other complaints.   Physical Findings: Weight: 150 lb 6.4 oz (68.221 kg). Unchanged  Impression:  The patient is tolerating radiation.  Plan:  Continue treatment as planned. RN education performed following todays visit. Start radiaplex.

## 2013-05-24 ENCOUNTER — Ambulatory Visit: Payer: Medicare Other

## 2013-05-24 ENCOUNTER — Ambulatory Visit
Admission: RE | Admit: 2013-05-24 | Discharge: 2013-05-24 | Disposition: A | Discharge status: Home / Self Care | Payer: Medicare Other | Source: Ambulatory Visit | Attending: Radiation Oncology | Admitting: Radiation Oncology

## 2013-05-25 ENCOUNTER — Ambulatory Visit
Admission: RE | Admit: 2013-05-25 | Discharge: 2013-05-25 | Disposition: A | Discharge status: Home / Self Care | Payer: Medicare Other | Source: Ambulatory Visit | Attending: Radiation Oncology | Admitting: Radiation Oncology

## 2013-05-25 ENCOUNTER — Ambulatory Visit: Payer: Medicare Other

## 2013-05-26 ENCOUNTER — Ambulatory Visit
Admission: RE | Admit: 2013-05-26 | Discharge: 2013-05-26 | Disposition: A | Discharge status: Home / Self Care | Payer: Medicare Other | Source: Ambulatory Visit | Attending: Radiation Oncology | Admitting: Radiation Oncology

## 2013-05-26 ENCOUNTER — Ambulatory Visit: Payer: Medicare Other

## 2013-05-29 ENCOUNTER — Ambulatory Visit
Admission: RE | Admit: 2013-05-29 | Discharge: 2013-05-29 | Disposition: A | Discharge status: Home / Self Care | Payer: Medicare Other | Source: Ambulatory Visit | Attending: Radiation Oncology | Admitting: Radiation Oncology

## 2013-05-29 ENCOUNTER — Ambulatory Visit: Payer: Medicare Other

## 2013-05-30 ENCOUNTER — Ambulatory Visit: Payer: Medicare Other

## 2013-05-30 ENCOUNTER — Ambulatory Visit
Admission: RE | Admit: 2013-05-30 | Discharge: 2013-05-30 | Disposition: A | Discharge status: Home / Self Care | Payer: Medicare Other | Source: Ambulatory Visit | Attending: Radiation Oncology | Admitting: Radiation Oncology

## 2013-05-30 VITALS — BP 110/74 | HR 63 | Temp 97.8°F | Wt 150.0 lb

## 2013-05-30 DIAGNOSIS — C50219 Malignant neoplasm of upper-inner quadrant of unspecified female breast: Secondary | ICD-10-CM

## 2013-05-30 NOTE — Progress Notes (Signed)
Weekly Management Note Current Dose: 25  Gy  Projected Dose: 42.5 Gy   Narrative:  The patient presents for routine under treatment assessment.  CBCT/MVCT images/Port film x-rays were reviewed.  The chart was checked. Some soreness when she raises her left arm (no SLB). Miminal breast pain.   Physical Findings: Weight: 150 lb (68.04 kg). Unchanged  Impression:  The patient is tolerating radiation.  Plan:  Continue treatment as planned. Continue radiaplex. Stretching exercises encouraged.

## 2013-05-30 NOTE — Progress Notes (Signed)
Weekly assessment of radiation to left breast.Completed 10 of 17 treatments.Denies pain or fatigue except question if radiation could be affecting nerves along left lateral side.Skin with mild discoloration.continue application of radiaplex.

## 2013-05-31 ENCOUNTER — Ambulatory Visit: Payer: Medicare Other

## 2013-05-31 ENCOUNTER — Ambulatory Visit
Admission: RE | Admit: 2013-05-31 | Discharge: 2013-05-31 | Disposition: A | Discharge status: Home / Self Care | Payer: Medicare Other | Source: Ambulatory Visit | Attending: Radiation Oncology | Admitting: Radiation Oncology

## 2013-06-01 ENCOUNTER — Ambulatory Visit: Payer: Medicare Other

## 2013-06-01 ENCOUNTER — Ambulatory Visit
Admission: RE | Admit: 2013-06-01 | Discharge: 2013-06-01 | Disposition: A | Discharge status: Home / Self Care | Payer: Medicare Other | Source: Ambulatory Visit | Attending: Radiation Oncology | Admitting: Radiation Oncology

## 2013-06-02 ENCOUNTER — Ambulatory Visit
Admission: RE | Admit: 2013-06-02 | Discharge: 2013-06-02 | Disposition: A | Discharge status: Home / Self Care | Payer: Medicare Other | Source: Ambulatory Visit | Attending: Radiation Oncology | Admitting: Radiation Oncology

## 2013-06-02 ENCOUNTER — Ambulatory Visit: Payer: Medicare Other

## 2013-06-05 ENCOUNTER — Ambulatory Visit
Admission: RE | Admit: 2013-06-05 | Discharge: 2013-06-05 | Disposition: A | Discharge status: Home / Self Care | Payer: Medicare Other | Source: Ambulatory Visit | Attending: Radiation Oncology | Admitting: Radiation Oncology

## 2013-06-05 ENCOUNTER — Ambulatory Visit: Payer: Medicare Other

## 2013-06-06 ENCOUNTER — Ambulatory Visit
Admission: RE | Admit: 2013-06-06 | Discharge: 2013-06-06 | Disposition: A | Discharge status: Home / Self Care | Payer: Medicare Other | Source: Ambulatory Visit | Attending: Radiation Oncology | Admitting: Radiation Oncology

## 2013-06-06 ENCOUNTER — Ambulatory Visit: Payer: Medicare Other

## 2013-06-06 DIAGNOSIS — C50219 Malignant neoplasm of upper-inner quadrant of unspecified female breast: Secondary | ICD-10-CM

## 2013-06-06 NOTE — Progress Notes (Signed)
Weekly Management Note Current Dose: 37.5  Gy  Projected Dose: 50 Gy   Narrative:  The patient presents for routine under treatment assessment.  CBCT/MVCT images/Port film x-rays were reviewed.  The chart was checked. Doing well. No complaints. REviewed electron set up on machine.   Physical Findings: Slightly pink breast.   Impression:  The patient is tolerating radiation.  Plan:  Continue treatment as planned. Continue radiaplex.

## 2013-06-07 ENCOUNTER — Ambulatory Visit
Admission: RE | Admit: 2013-06-07 | Discharge: 2013-06-07 | Disposition: A | Discharge status: Home / Self Care | Payer: Medicare Other | Source: Ambulatory Visit | Attending: Radiation Oncology | Admitting: Radiation Oncology

## 2013-06-07 ENCOUNTER — Ambulatory Visit: Payer: Medicare Other

## 2013-06-08 ENCOUNTER — Ambulatory Visit: Payer: Medicare Other

## 2013-06-08 ENCOUNTER — Ambulatory Visit
Admission: RE | Admit: 2013-06-08 | Discharge: 2013-06-08 | Disposition: A | Discharge status: Home / Self Care | Payer: Medicare Other | Source: Ambulatory Visit | Attending: Radiation Oncology | Admitting: Radiation Oncology

## 2013-06-09 ENCOUNTER — Telehealth: Payer: Self-pay | Admitting: Radiation Oncology

## 2013-06-09 ENCOUNTER — Ambulatory Visit: Payer: Medicare Other

## 2013-06-09 ENCOUNTER — Ambulatory Visit
Admission: RE | Admit: 2013-06-09 | Discharge: 2013-06-09 | Disposition: A | Discharge status: Home / Self Care | Payer: Medicare Other | Source: Ambulatory Visit | Attending: Radiation Oncology | Admitting: Radiation Oncology

## 2013-06-09 NOTE — Telephone Encounter (Signed)
Took purple folder from Dr. Unknown Jim desk to [employer] with signed and completed [employer] paperwork.

## 2013-06-12 ENCOUNTER — Ambulatory Visit: Payer: Medicare Other

## 2013-06-12 ENCOUNTER — Ambulatory Visit
Admission: RE | Admit: 2013-06-12 | Discharge: 2013-06-12 | Disposition: A | Discharge status: Home / Self Care | Payer: Medicare Other | Source: Ambulatory Visit | Attending: Radiation Oncology | Admitting: Radiation Oncology

## 2013-06-13 ENCOUNTER — Ambulatory Visit: Payer: Medicare Other

## 2013-06-13 ENCOUNTER — Encounter: Payer: Self-pay | Admitting: Radiation Oncology

## 2013-06-13 ENCOUNTER — Ambulatory Visit
Admission: RE | Admit: 2013-06-13 | Discharge: 2013-06-13 | Disposition: A | Discharge status: Home / Self Care | Payer: Medicare Other | Source: Ambulatory Visit | Attending: Radiation Oncology | Admitting: Radiation Oncology

## 2013-06-13 VITALS — BP 138/76 | HR 65 | Temp 97.5°F | Resp 20 | Wt 147.8 lb

## 2013-06-13 DIAGNOSIS — C50219 Malignant neoplasm of upper-inner quadrant of unspecified female breast: Secondary | ICD-10-CM

## 2013-06-13 NOTE — Progress Notes (Signed)
  Radiation Oncology         [phone] ________________________________  Name: Alison [name] MRN: [id number]  Date: 06/13/2013  DOB: 1945-03-10  End of Treatment Note  Diagnosis:   DCIS of the Left Breast     Indication for treatment:  Curative       Radiation treatment dates:   3/10-06/13/13  Site/dose:    Left breast/ 42.5 at 2.5 Gy per fraction x 17 fractions Left breast boost 7.5 Gy at 2.5 Gy per fraction x 3 fractions  Beams/energy:   6 MV photons were used for the tangent fields followed by an en face electron boost of 9 MeV electrons.   Narrative: The patient tolerated radiation treatment relatively well.   She had minimal skin tanning and minimal soreness. She would like a consult appointment with Dr. [name] to discuss antiestrogen therapy.   Plan: The patient has completed radiation treatment. The patient will return to radiation oncology clinic for routine followup in one month. I advised them to call or return sooner if they have any questions or concerns related to their recovery or treatment.  ------------------------------------------------  Thea Silversmith, MD

## 2013-06-13 NOTE — Progress Notes (Addendum)
Pt completed treatment today; gave her FU card. Pt denies pain, loss of appetite. She states she gets sleepy earlier at night. Pt applying Radiaplex to left breast treatment area for tanning of skin; no desquamation noted. Advised she apply Radiaplex for 2-3 more weeks then switch to lotion w/vit E. Gave pt info on [facility], [facility]. Cautioned pt on sun exposure to treated area.

## 2013-06-15 ENCOUNTER — Telehealth: Payer: Self-pay | Admitting: *Deleted

## 2013-06-15 DIAGNOSIS — C50219 Malignant neoplasm of upper-inner quadrant of unspecified female breast: Secondary | ICD-10-CM

## 2013-06-15 NOTE — Telephone Encounter (Signed)
Called pt to schedule appt with Dr. [name]. Confirmed new appt date and time 07/27/13 at 4:00.  Pt denies further needs at this time.  Mailed appt letter and packet.

## 2013-06-23 NOTE — Progress Notes (Addendum)
Name: MARKIYA KEEFE   MRN: [id number]  Date:  06/01/13   DOB: 12-May-1945  Status:outpatient    DIAGNOSIS: Breast cancer.  CONSENT VERIFIED: yes   SET UP: Patient is setup supine   IMMOBILIZATION:  The following immobilization was used:Custom Moldable Pillow, breast board.   NARRATIVE: [name] underwent complex simulation and treatment planning for her boost treatment today.  Her tumor volume was outlined on the planning CT scan. The depth of her cavity was felt to be appropriate for treatment with electrons    9  MeV electrons will be prescribed to the 95% isodose line.   I personally oversaw and approved the construction of a unique block which will be used for beam modification purposes.  A special port plan is requested.

## 2013-06-23 NOTE — Addendum Note (Signed)
Encounter addended by: Thea Silversmith, MD on: 06/23/2013 10:52 AM<BR>     Documentation filed: Notes Section

## 2013-07-02 ENCOUNTER — Other Ambulatory Visit: Payer: Self-pay | Admitting: Family Medicine

## 2013-07-04 ENCOUNTER — Encounter: Payer: Self-pay | Admitting: *Deleted

## 2013-07-04 NOTE — Progress Notes (Signed)
Completed chart, labs entered, added to spreadsheet & placed in Dr. Magrinat's box.  

## 2013-07-06 ENCOUNTER — Encounter (INDEPENDENT_AMBULATORY_CARE_PROVIDER_SITE_OTHER): Payer: Self-pay | Admitting: General Surgery

## 2013-07-27 ENCOUNTER — Other Ambulatory Visit (HOSPITAL_BASED_OUTPATIENT_CLINIC_OR_DEPARTMENT_OTHER): Payer: Medicare Other

## 2013-07-27 ENCOUNTER — Ambulatory Visit
Admission: RE | Admit: 2013-07-27 | Discharge: 2013-07-27 | Disposition: A | Discharge status: Home / Self Care | Payer: Medicare Other | Source: Ambulatory Visit | Attending: Radiation Oncology | Admitting: Radiation Oncology

## 2013-07-27 ENCOUNTER — Ambulatory Visit: Payer: Medicare Other

## 2013-07-27 ENCOUNTER — Ambulatory Visit (HOSPITAL_BASED_OUTPATIENT_CLINIC_OR_DEPARTMENT_OTHER): Payer: Medicare Other | Admitting: Oncology

## 2013-07-27 VITALS — BP 113/67 | HR 66 | Temp 97.4°F | Wt 145.2 lb

## 2013-07-27 VITALS — BP 120/73 | HR 67 | Temp 98.1°F | Resp 18 | Ht 68.5 in | Wt 145.3 lb

## 2013-07-27 DIAGNOSIS — M949 Disorder of cartilage, unspecified: Secondary | ICD-10-CM

## 2013-07-27 DIAGNOSIS — C50219 Malignant neoplasm of upper-inner quadrant of unspecified female breast: Secondary | ICD-10-CM

## 2013-07-27 DIAGNOSIS — D0001 Carcinoma in situ of labial mucosa and vermilion border: Secondary | ICD-10-CM

## 2013-07-27 DIAGNOSIS — C50312 Malignant neoplasm of lower-inner quadrant of left female breast: Secondary | ICD-10-CM | POA: Insufficient documentation

## 2013-07-27 DIAGNOSIS — C50912 Malignant neoplasm of unspecified site of left female breast: Secondary | ICD-10-CM

## 2013-07-27 DIAGNOSIS — D0008 Carcinoma in situ of pharynx: Secondary | ICD-10-CM

## 2013-07-27 DIAGNOSIS — D0590 Unspecified type of carcinoma in situ of unspecified breast: Secondary | ICD-10-CM

## 2013-07-27 DIAGNOSIS — K2270 Barrett's esophagus without dysplasia: Secondary | ICD-10-CM

## 2013-07-27 DIAGNOSIS — G43909 Migraine, unspecified, not intractable, without status migrainosus: Secondary | ICD-10-CM

## 2013-07-27 DIAGNOSIS — Z170 Estrogen receptor positive status [ER+]: Secondary | ICD-10-CM

## 2013-07-27 DIAGNOSIS — M899 Disorder of bone, unspecified: Secondary | ICD-10-CM

## 2013-07-27 DIAGNOSIS — D0000 Carcinoma in situ of oral cavity, unspecified site: Secondary | ICD-10-CM

## 2013-07-27 LAB — CBC WITH DIFFERENTIAL/PLATELET
BASO%: 0.6 % (ref 0.0–2.0)
Basophils Absolute: 0 10*3/uL (ref 0.0–0.1)
EOS%: 2.3 % (ref 0.0–7.0)
Eosinophils Absolute: 0.2 10*3/uL (ref 0.0–0.5)
HCT: 40.4 % (ref 34.8–46.6)
HEMOGLOBIN: 13.5 g/dL (ref 11.6–15.9)
LYMPH%: 22.2 % (ref 14.0–49.7)
MCH: 30 pg (ref 25.1–34.0)
MCHC: 33.3 g/dL (ref 31.5–36.0)
MCV: 89.9 fL (ref 79.5–101.0)
MONO#: 0.5 10*3/uL (ref 0.1–0.9)
MONO%: 7.3 % (ref 0.0–14.0)
NEUT%: 67.6 % (ref 38.4–76.8)
NEUTROS ABS: 4.5 10*3/uL (ref 1.5–6.5)
PLATELETS: 267 10*3/uL (ref 145–400)
RBC: 4.5 10*6/uL (ref 3.70–5.45)
RDW: 12.6 % (ref 11.2–14.5)
WBC: 6.6 10*3/uL (ref 3.9–10.3)
lymph#: 1.5 10*3/uL (ref 0.9–3.3)

## 2013-07-27 LAB — COMPREHENSIVE METABOLIC PANEL (CC13)
ALT: 12 U/L (ref 0–55)
ANION GAP: 9 meq/L (ref 3–11)
AST: 13 U/L (ref 5–34)
Albumin: 3.8 g/dL (ref 3.5–5.0)
Alkaline Phosphatase: 74 U/L (ref 40–150)
BILIRUBIN TOTAL: 0.46 mg/dL (ref 0.20–1.20)
BUN: 17.1 mg/dL (ref 7.0–26.0)
CO2: 25 meq/L (ref 22–29)
Calcium: 9.5 mg/dL (ref 8.4–10.4)
Chloride: 105 mEq/L (ref 98–109)
Creatinine: 0.8 mg/dL (ref 0.6–1.1)
Glucose: 135 mg/dl (ref 70–140)
Potassium: 4.4 mEq/L (ref 3.5–5.1)
Sodium: 139 mEq/L (ref 136–145)
Total Protein: 6.4 g/dL (ref 6.4–8.3)

## 2013-07-27 NOTE — Progress Notes (Signed)
[place]  [phone] [phone]     ID: Alison [name] OB: 1945/06/12  MR#: [id number]  XIH#:[id number]  PCP: Alison Bible, MD GYN:   SU: Alison [name] OTHER MD: Alison [name]  CHIEF COMPLAINT: "I have breast cancer"  BREAST CANCER HISTORY: Alison [name] had screening mammography 03/21/2013 suggesting a change in her left breast. Unilateral left diagnostic mammography 03/23/2013 showed a 3 mm cluster of calcifications in the left breast lower inner quadrant. There was a second cluster measuring 1.1 cm in the left breast upper inner quadrant. Stereotactic biopsy of the upper inner quadrant calcifications was attempted, but the calcifications could not be located could not be located stereotactically and the patient proceeded to left lumpectomy 04/17/2013. 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 The inferior margin was at 0.1 cm. (Dr. Constance [name] tomorrow took additional tissue from what the pathology report calls the medial margin, but apparently was the inferior margin, and this was clear).  Estrogen receptor was 100% positive. Progesterone receptor was 97% positive. Both showed strong staining intensity.  The patient proceeded to adjuvant radiation which was recently completed 06/13/2013.  Her subsequent history is as detailed below  INTERVAL HISTORY: Alison [name] was evaluated in the breast clinic 07/27/2013 accompanied by her partner Alison [name].  REVIEW OF SYSTEMS: Alison [name] tolerated radiation well, with "a little tan", but no significant fatigue. She exercises by gardening but also by walking 3-4 times a week anywhere from 30 minutes to an hour and a half. She complains of hearing loss, has a history of irregular heartbeats which is well-controlled on metoprolol, has mild stress urinary incontinence, some back and joint pain which is not more persistent or intense than usual, and moderate hot flashes. A detailed review of  systems was otherwise noncontributory  PAST MEDICAL HISTORY: Past Medical History  Diagnosis Date  . GERD (gastroesophageal reflux disease)   . Internal hemorrhoids   . Esophagitis   . 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 H/O adenomatous polyp of colon   . Esophageal stricture   . Arthritis   . PONV (postoperative nausea and vomiting)   . Contact lens/glasses fitting     wears contacts or glasses  . Vertigo   . Radiation 05/16/13-06/13/13    Left breast     PAST SURGICAL HISTORY: Past Surgical History  Procedure Laterality Date  . Ovarian cyst removal    . Tonsillectomy    . Total hip arthroplasty  2005    right  . Nissen fundoplication  3491  . Upper gi endoscopy    . Colonoscopy    . Breast biopsy Left 04/17/2013    Procedure: BREAST BIOPSY WITH NEEDLE LOCALIZATION;  Surgeon: Alison Hollingshead, MD;  Location: [facility];  Service: General;  Laterality: Left;    FAMILY HISTORY Family History  Problem Relation Age of Onset  . Heart attack Father   . Lung cancer Mother    one of the patient's mother is 5 sisters (a maternal aunt) was diagnosed with breast cancer at the age of 69. There are no other breast cancers in the family to her knowledge, and no cases of ovarian cancer.  GYNECOLOGIC HISTORY:  Menarche age 30, the patient is GX P0. She went through the change of life more than 15 years ago. She did not take hormone replacement. 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 They have no pets. The patient is not a church attender    ADVANCED DIRECTIVES: In place; Alison [name] is [employer] power of attorney   HEALTH MAINTENANCE: History  Substance Use Topics  . Smoking status: Former Smoker -- 5 years    Types: Cigarettes    Quit date: 08/10/1977  . Smokeless tobacco: Never Used     Comment: She smoked  for 5 years in her early 87s.    . Alcohol Use: Yes     Colonoscopy: 2010/ Alison [name]  PAP: April 2014  Bone density: 2013; showed osteopenia  Lipid panel:  Allergies  Allergen Reactions  . Adhesive [Tape]   . Morphine Rash  . Sulfonamide Derivatives Rash    Current Outpatient Prescriptions  Medication Sig Dispense Refill  . meclizine (ANTIVERT) 25 MG tablet Take 1 tablet (25 mg total) by mouth 3 (three) times daily as needed for dizziness.  90 tablet  1  . metoprolol tartrate (LOPRESSOR) 25 MG tablet Take 25 mg by mouth 2 (two) times daily. Takes for occ rapid hr      . omeprazole (PRILOSEC) 20 MG capsule Take 1 capsule (20 mg total) by mouth 2 (two) times daily.  180 capsule  3  . SUMAtriptan (IMITREX) 20 MG/ACT nasal spray Place 1 spray (20 mg total) into the nose every 2 (two) hours as needed for migraine.  8 Inhaler  11  . triamterene-hydrochlorothiazide (MAXZIDE-25) 37.5-25 MG per tablet TAKE 1 TABLET(S) BY MOUTH EVERY MORNING-takes for fluid and vertigo       No current facility-administered medications for this visit.    OBJECTIVE: Middle-aged white woman who appears younger than stated age 47 Vitals:   07/27/13 1624  BP: 120/73  Pulse: 67  Temp: 98.1 F (36.7 C)  Resp: 18     Body mass index is 21.77 kg/(m^2).    ECOG FS:0 - Asymptomatic  Ocular: Sclerae unicteric, EOMs intact Ear-nose-throat: Oropharynx clear, dentition in good repair Lymphatic: No cervical or supraclavicular adenopathy Lungs no rales or rhonchi, good excursion bilaterally Heart regular rate and rhythm, no murmur appreciated Abd soft, nontender, positive bowel sounds MSK no focal spinal tenderness, no joint edema Neuro: non-focal, well-oriented, positive affect Breasts: The right breast is unremarkable. The left breast is status post lumpectomy and radiation. There is mild hyperpigmentation. The cosmetic result is excellent. Left axilla is benign.   LAB RESULTS:  CMP     Component Value  Date/Time   NA 139 07/27/2013 1558   NA 140 04/12/2013 1321   K 4.4 07/27/2013 1558   K 4.4 04/12/2013 1321   CL 102 04/12/2013 1321   CO2 25 07/27/2013 1558   CO2 25 04/12/2013 1321   GLUCOSE 135 07/27/2013 1558   GLUCOSE 63* 04/12/2013 1321   BUN 17.1 07/27/2013 1558   BUN 15 04/12/2013 1321   CREATININE 0.8 07/27/2013 1558   CREATININE 0.73 04/12/2013 1321   CREATININE 0.70 07/18/2012 0918   CALCIUM 9.5 07/27/2013 1558   CALCIUM 9.3 04/12/2013 1321   PROT 6.4 07/27/2013 1558   PROT 6.4 07/18/2012 0918   ALBUMIN 3.8 07/27/2013 1558   ALBUMIN 4.2 07/18/2012 0918   AST 13 07/27/2013 1558   AST 14 07/18/2012 0918   ALT 12 07/27/2013 1558   ALT 12 07/18/2012 0918   ALKPHOS 74 07/27/2013 1558   ALKPHOS 74 07/18/2012 0918   BILITOT 0.46 07/27/2013 1558   BILITOT 0.8 07/18/2012 0918   GFRNONAA  86* 04/12/2013 1321   GFRNONAA 89 07/18/2012 0918   GFRAA >90 04/12/2013 1321   GFRAA >89 07/18/2012 0918    I No results found for this basename: SPEP, UPEP,  kappa and lambda light chains    Lab Results  Component Value Date   WBC 6.6 07/27/2013   NEUTROABS 4.5 07/27/2013   HGB 13.5 07/27/2013   HCT 40.4 07/27/2013   MCV 89.9 07/27/2013   PLT 267 07/27/2013      Chemistry      Component Value Date/Time   NA 139 07/27/2013 1558   NA 140 04/12/2013 1321   K 4.4 07/27/2013 1558   K 4.4 04/12/2013 1321   CL 102 04/12/2013 1321   CO2 25 07/27/2013 1558   CO2 25 04/12/2013 1321   BUN 17.1 07/27/2013 1558   BUN 15 04/12/2013 1321   CREATININE 0.8 07/27/2013 1558   CREATININE 0.73 04/12/2013 1321   CREATININE 0.70 07/18/2012 0918      Component Value Date/Time   CALCIUM 9.5 07/27/2013 1558   CALCIUM 9.3 04/12/2013 1321   ALKPHOS 74 07/27/2013 1558   ALKPHOS 74 07/18/2012 0918   AST 13 07/27/2013 1558   AST 14 07/18/2012 0918   ALT 12 07/27/2013 1558   ALT 12 07/18/2012 0918   BILITOT 0.46 07/27/2013 1558   BILITOT 0.8 07/18/2012 0918       No results found for this basename: LABCA2    No components found with this basename:  LABCA125    No results found for this basename: INR,  in the last 168 hours  Urinalysis No results found for this basename: colorurine, appearanceur, labspec, phurine, glucoseu, hgbur, bilirubinur, ketonesur, proteinur, urobilinogen, nitrite, leukocytesur    STUDIES: No results found. Previous films reviewed   ASSESSMENT: 68 y.o. 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 Andre understands noninvasive breast cancer in itself is not life threatening. The cancer cells are trapped in the ducts and cannot travel to vital organ. Because she opted to keep her breast, which is the standard of care and what we normally recommend, she accepts some small risk of the cancer recurring in the same breast. She cut that risk by more than half with radiation.  I would estimate that her risk of local recurrence at this point is less than 10%.  If she took anti-estrogens for 5 years, and she could cut that risk beginning half. Clearly 95% of women like her, therefore, would not benefit from antiestrogen is in terms of treatment of this cancer.  On the other hand she is at risk of developing a new breast cancer in either breast. That risk is about 1% per year. If she lives another 26 or 30 years, as seems likely, antioxidants would cut that risk in half as well. This is a more sizable risk reduction.  We then discussed the difference between tamoxifen and the aromatase inhibitors. Maury has a good understanding of the possible toxicities, side effects, and complications. We also discussed  denosumab and zolendronate, which she would be interested in receiving if she opted for anastrozole.  After all this discussion she feels  most likely she would not want to take an antiestrogen, but if she did it would probably be tamoxifen. She will let us know if she does make that choice, and I will be glad to put that prescription in for her.  Married does qualify for genetic testing and we are requesting that. She understands that maybe 2 or 3 months before that appointment can take place.  If she sees Dr. Zella Richer in July, she can see me again late January, after her next mammogram. The plan would be to follow her on a yearly basis for 5 years.  Brindy is a good understanding of the overall plan. She agrees with it. She knows a goal of treatment in her case is cure. She will call with any problems that may develop before her next visit here.      Chauncey Cruel, MD   07/27/2013 5:57 PM

## 2013-07-27 NOTE — Progress Notes (Signed)
Patient for routine one month follow up completion of radiation to left breast on 06/13/2013.Denies pain.Skin looks great.Continues to use radiaplex.to discuss use of antiestrogen therapy this evening with Dr.Magrinat.

## 2013-07-28 ENCOUNTER — Telehealth: Payer: Self-pay | Admitting: *Deleted

## 2013-07-28 ENCOUNTER — Telehealth: Payer: Self-pay | Admitting: Oncology

## 2013-07-28 NOTE — Telephone Encounter (Signed)
Called pt to schedule a genetic appt and she could not make the appt right now, so she wanted to see if I could call her Tuesday.  Told her that was fine and I would talk with her then.

## 2013-07-28 NOTE — Telephone Encounter (Signed)
Mailed after appt letter to pt. 

## 2013-07-28 NOTE — Telephone Encounter (Signed)
s.w. pt and advised on Jan 2016 appt...pt not sure if work sched will allow...she will call back to r/s

## 2013-07-28 NOTE — Progress Notes (Signed)
   Department of Radiation Oncology  Phone:  [phone] Fax:        [phone]   Name: Alison [name] MRN: [id number]  DOB: 1945-07-13  Date: 07/27/2013  Follow Up Visit Note  Diagnosis: DCIS of the Left Breast  Summary and Interval since last radiation: 50 Gy completed 06/13/13  Interval History: Alison [name] presents today for routine followup.  She is feeling well and doing well. She has no complaints today. They enjoyed their visit with friends after her treatment.  She is pleased with her skin healing.  She is scheduled to meet with Dr. [name] after this appointment to discuss antiestrogen therapy.   Allergies:  Allergies  Allergen Reactions  . Adhesive [Tape]   . Morphine Rash  . Sulfonamide Derivatives Rash    Medications:  Current Outpatient Prescriptions  Medication Sig Dispense Refill  . meclizine (ANTIVERT) 25 MG tablet Take 1 tablet (25 mg total) by mouth 3 (three) times daily as needed for dizziness.  90 tablet  1  . triamterene-hydrochlorothiazide (MAXZIDE-25) 37.5-25 MG per tablet TAKE 1 TABLET(S) BY MOUTH EVERY MORNING-takes for fluid and vertigo      . metoprolol tartrate (LOPRESSOR) 25 MG tablet Take 25 mg by mouth 2 (two) times daily. Takes for occ rapid hr      . omeprazole (PRILOSEC) 20 MG capsule Take 1 capsule (20 mg total) by mouth 2 (two) times daily.  180 capsule  3  . SUMAtriptan (IMITREX) 20 MG/ACT nasal spray Place 1 spray (20 mg total) into the nose every 2 (two) hours as needed for migraine.  8 Inhaler  11   No current facility-administered medications for this encounter.    Physical Exam:  Filed Vitals:   07/27/13 1427  BP: 113/67  Pulse: 66  Temp: 97.4 F (36.3 C)  Weight: 145 lb 3.2 oz (65.862 kg)   Well healed skin over left breast. Only slight hyperpigmentation  IMPRESSION: Alison [name] is a 68 y.o. female s/p breast conservation with resolving acute effects of treatment.   PLAN:  She is doing well. We discussed the need for follow up every 4-6  months which she has scheduled.  We discussed the need for yearly mammograms which she can schedule with her OBGYN or with medical oncology. We discussed the need for sun protection in the treated area.  We discussed the pros and cons of antiestrogen treatment. She can always call me with questions.  I will follow up with her in 6 months incase she elects not to proceed with antiestrogen treatment.     Thea Silversmith, MD

## 2013-08-07 ENCOUNTER — Telehealth: Payer: Self-pay | Admitting: *Deleted

## 2013-08-07 NOTE — Telephone Encounter (Signed)
Called pt to schedule a genetic appt w/ her and [place] and requested for me to call her back tomorrow.

## 2013-08-09 ENCOUNTER — Telehealth: Payer: Self-pay | Admitting: *Deleted

## 2013-08-09 NOTE — Telephone Encounter (Signed)
Called pt to schedule the genetics and she was in her car with no calendar.  Gave her some date and times.  She will get to her calendar and call me to let me know what works for her.

## 2013-08-11 ENCOUNTER — Encounter: Payer: Self-pay | Admitting: Family Medicine

## 2013-08-11 ENCOUNTER — Ambulatory Visit (INDEPENDENT_AMBULATORY_CARE_PROVIDER_SITE_OTHER): Payer: Medicare Other | Admitting: Family Medicine

## 2013-08-11 VITALS — BP 129/78 | HR 62 | Resp 16 | Ht 67.25 in | Wt 147.0 lb

## 2013-08-11 DIAGNOSIS — K2100 Gastro-esophageal reflux disease with esophagitis, without bleeding: Secondary | ICD-10-CM

## 2013-08-11 DIAGNOSIS — I10 Essential (primary) hypertension: Secondary | ICD-10-CM

## 2013-08-11 DIAGNOSIS — Z23 Encounter for immunization: Secondary | ICD-10-CM

## 2013-08-11 DIAGNOSIS — H8110 Benign paroxysmal vertigo, unspecified ear: Secondary | ICD-10-CM

## 2013-08-11 DIAGNOSIS — G43909 Migraine, unspecified, not intractable, without status migrainosus: Secondary | ICD-10-CM

## 2013-08-11 DIAGNOSIS — Z0000 Encounter for general adult medical examination without abnormal findings: Secondary | ICD-10-CM

## 2013-08-11 LAB — POCT URINALYSIS DIPSTICK
Bilirubin, UA: NEGATIVE
Blood, UA: NEGATIVE
Glucose, UA: NEGATIVE
Ketones, UA: NEGATIVE
Leukocytes, UA: NEGATIVE
Nitrite, UA: NEGATIVE
Protein, UA: NEGATIVE
Spec Grav, UA: 1.015
Urobilinogen, UA: NEGATIVE
pH, UA: 6.5

## 2013-08-11 MED ORDER — METOPROLOL TARTRATE 25 MG PO TABS: 25.0000 mg | ORAL_TABLET | Freq: Two times a day (BID) | ORAL | Status: DC

## 2013-08-11 MED ORDER — TRIAMTERENE-HCTZ 37.5-25 MG PO TABS: 1.0000 | ORAL_TABLET | Freq: Every day | ORAL | Status: DC

## 2013-08-11 MED ORDER — SUMATRIPTAN 20 MG/ACT NA SOLN: 1.0000 | NASAL | Status: AC | PRN

## 2013-08-11 MED ORDER — MECLIZINE HCL 25 MG PO TABS: 25.0000 mg | ORAL_TABLET | Freq: Three times a day (TID) | ORAL | Status: AC | PRN

## 2013-08-11 MED ORDER — OMEPRAZOLE 20 MG PO CPDR: 20.0000 mg | DELAYED_RELEASE_CAPSULE | Freq: Two times a day (BID) | ORAL | Status: DC

## 2013-08-11 NOTE — Progress Notes (Signed)
Subjective:    Patient ID: Alison [name], female    DOB: 13-Mar-1945, 68 y.o.   MRN: [id number]  HPI  Kristyanna is here today for her annual CPE. She had her last PAP last year. Overall, she is doing well. She was diagnosed with DCIS of the left breast in January of this year and underwent a lumpectomy by Dr. Zella Richer.  She was positive for both estrogen and progesterone receptors and she underwent radiation treatments.  She also needs to discuss the following conditions:    1)  Hypertension:  She is doing well on the Lopressor and the Maxzide.  2)  Migraines:  She is needing a refill on her Imitrex.  3)  GERD:  Her symptoms are controlled on Prilosec.       Review of Systems  Constitutional: Negative for activity change, appetite change, fatigue and unexpected weight change.  HENT: Negative for congestion, dental problem, ear pain, hearing loss, trouble swallowing and voice change.   Eyes: Negative for pain, redness and visual disturbance.  Respiratory: Negative for cough and shortness of breath.   Cardiovascular: Negative for chest pain, palpitations and leg swelling.  Gastrointestinal: Negative for nausea, vomiting, abdominal pain, diarrhea, constipation and blood in stool.  Endocrine: Negative for cold intolerance, heat intolerance, polydipsia, polyphagia and polyuria.  Genitourinary: Negative for dysuria, urgency, frequency, hematuria, vaginal discharge and pelvic pain.  Musculoskeletal: Negative for arthralgias, back pain, joint swelling, myalgias and neck pain.  Skin: Negative for rash.  Neurological: Negative for dizziness, weakness and headaches.  Hematological: Negative for adenopathy. Does not bruise/bleed easily.  Psychiatric/Behavioral: Negative for sleep disturbance, dysphoric mood and decreased concentration. The patient is not nervous/anxious.   All other systems reviewed and are negative.      Objective:   Physical Exam  Vitals reviewed. Constitutional: She is  oriented to person, place, and time. She appears well-developed and well-nourished.  HENT:  Head: Normocephalic and atraumatic.  Right Ear: External ear normal.  Left Ear: External ear normal.  Nose: Nose normal.  Mouth/Throat: Oropharynx is clear and moist.  Eyes: Conjunctivae and EOM are normal. Pupils are equal, round, and reactive to light.  Neck: Normal range of motion. No thyromegaly present.  Cardiovascular: Normal rate, regular rhythm, normal heart sounds and intact distal pulses.  Exam reveals no gallop and no friction rub.   No murmur heard. Pulmonary/Chest: Effort normal and breath sounds normal. Right breast exhibits no inverted nipple, no mass, no nipple discharge, no skin change and no tenderness. Left breast exhibits no inverted nipple, no mass, no nipple discharge, no skin change and no tenderness. Breasts are symmetrical.    Abdominal: Soft. Bowel sounds are normal. Hernia confirmed negative in the right inguinal area and confirmed negative in the left inguinal area.  Genitourinary: Vagina normal and uterus normal. Pelvic exam was performed with patient supine. There is no rash, tenderness or lesion on the right labia. There is no rash, tenderness or lesion on the left labia. No vaginal discharge found.  Musculoskeletal: Normal range of motion. She exhibits no edema and no tenderness.  Lymphadenopathy:    She has no cervical adenopathy.       Right: No inguinal adenopathy present.       Left: No inguinal adenopathy present.  Neurological: She is alert and oriented to person, place, and time. She has normal reflexes.  Skin: Skin is warm and dry.  Psychiatric: She has a normal mood and affect. Her behavior is normal. Judgment and thought  content normal.       Assessment & Plan:    Heavenleigh was seen today for annual exam.  Diagnoses and associated orders for this visit:  Routine general medical examination at a health care facility - POCT urinalysis dipstick  Need for  prophylactic vaccination against Streptococcus pneumoniae (pneumococcus) - Pneumococcal conjugate vaccine 13-valent  Essential hypertension, benign - triamterene-hydrochlorothiazide (MAXZIDE-25) 37.5-25 MG per tablet; Take 1 tablet by mouth daily. - metoprolol tartrate (LOPRESSOR) 25 MG tablet; Take 1 tablet (25 mg total) by mouth 2 (two) times daily.  GERD (gastroesophageal reflux disease) - omeprazole (PRILOSEC) 20 MG capsule; Take 1 capsule (20 mg total) by mouth 2 (two) times daily.  Migraine, unspecified, without mention of intractable migraine without mention of status migrainosus - SUMAtriptan (IMITREX) 20 MG/ACT nasal spray; Place 1 spray (20 mg total) into the nose every 2 (two) hours as needed for migraine.  Benign paroxysmal positional vertigo - meclizine (ANTIVERT) 25 MG tablet; Take 1 tablet (25 mg total) by mouth 3 (three) times daily as needed for dizziness.   TIME SPENT "FACE TO FACE" WITH PATIENT -  49 MINS

## 2013-08-11 NOTE — Patient Instructions (Addendum)
1)  Preventative - You received the Prevnar 13 at today's visit.  Check with Dr. Ricky Stabs office to see if you needed your repeat colonoscopy at 5 or 10 years.  If they say 10 years then complete and return the stool cards.     BRCA-1 and BRCA-2 BRCA-1 and BRCA-2 are 2 genes that are linked with hereditary breast and ovarian cancers. About 200,000 women are diagnosed with invasive breast cancer each year and about 23,000 with ovarian cancer (according to the [place]). Of these cancers, about 5% to 10% will be due to a mutation in one of the BRCA genes. Men can also inherit an increased risk of developing breast cancer, primarily from an alteration in the BRCA-2 gene.  Individuals with mutations in BRCA1 or BRCA2 have significantly elevated risks for breast cancer (up to 80% lifetime risk), ovarian cancer (up to 40% lifetime risk), bilateral breast cancer and other types of cancers. BRCA mutations are inherited and passed from generation to generation. One half of the time, they are passed from the father's side of the family.  The DNA in white blood cells is used to detect mutations in the BRCA genes. While the gene products (proteins) of the BRCA genes act only in breast and ovarian tissue, the genes are present in every cell of the body and blood is the most easily accessible source of that DNA. PREPARATION FOR TEST The test for BRCA mutations is done on a blood sample collected by needle from a vein in the arm. The test does not require surgical biopsy of breast or ovarian tissue.  NORMAL FINDINGS No genetic mutations. Ranges for normal findings may vary among different laboratories and hospitals. You should always check with your doctor after having lab work or other tests done to discuss the meaning of your test results and whether your values are considered within normal limits. MEANING OF TEST  Your caregiver will go over the test results with you and discuss the importance and  meaning of your results, as well as treatment options and the need for additional tests if necessary. OBTAINING THE TEST RESULTS It is your responsibility to obtain your test results. Ask the lab or department performing the test when and how you will get your results. OTHER THINGS TO KNOW Your test results may have implications for other family members. When one member of a family is tested for BRCA mutations, issues often arise about how or whether to share this information with other family members. Seek advice from a genetic counselor about communication of result with your family members.  Pre and post test consultation with a health care provider knowledgeable about genetic testing cannot be overemphasized.  There are many issues to be considered when preparing for a genetic test and upon learning the results, and a genetic counselor has the knowledge and experience to help you sort through them.  If the BRCA test is positive, the options include increased frequency of check-ups (e.g., mammography, blood tests for CA-125, or transvaginal ultrasonography); medications that could reduce risk (e.g., oral contraceptives or tamoxifen); or surgical removal of the ovaries or breasts. There are a number of variables involved and it is important to discuss your options with your doctor and genetic counselor. Research studies have reported that for every 1000 women negative for BRCA mutations, between 12 and 44 of them will develop breast cancer by age 28 and between 74 and 62 will develop ovarian cancer by age 41. The risk increases with  age. The test can be ordered by a doctor, preferably by one who can also offer genetic counseling. The blood sample will be sent to a laboratory that specializes in BRCA testing. The American Society of Clinical Oncology and the [place] encourage women seeking the test to participate in long-term outcome studies to help gather information on the  effectiveness of different check-up and treatment options. Document Released: 03/19/2004 Document Revised: 05/18/2011 Document Reviewed: 01/30/2008 [facility] Patient Information 2014 [place], [place].  ,

## 2013-08-14 ENCOUNTER — Telehealth: Payer: Self-pay | Admitting: *Deleted

## 2013-08-14 NOTE — Telephone Encounter (Signed)
Pt called and left me a message stating the dates I gave her for June does not work with her schedule.  I called and left her a message w/ the July dates and for her to call me back with whichever works for her schedule.

## 2013-08-14 NOTE — Telephone Encounter (Signed)
Pt returned my call and I confirmed 09/13/13 genetic appt w/ pt.

## 2013-09-05 ENCOUNTER — Encounter: Payer: Self-pay | Admitting: *Deleted

## 2013-09-05 NOTE — Progress Notes (Signed)
[employer] form received. RN completed medical componant and given to managed care for additional information.

## 2013-09-13 ENCOUNTER — Other Ambulatory Visit: Payer: Medicare Other

## 2013-09-13 ENCOUNTER — Ambulatory Visit (HOSPITAL_BASED_OUTPATIENT_CLINIC_OR_DEPARTMENT_OTHER): Payer: Medicare Other | Admitting: Genetic Counselor

## 2013-09-13 DIAGNOSIS — D0510 Intraductal carcinoma in situ of unspecified breast: Secondary | ICD-10-CM | POA: Insufficient documentation

## 2013-09-13 DIAGNOSIS — D0590 Unspecified type of carcinoma in situ of unspecified breast: Secondary | ICD-10-CM

## 2013-09-13 DIAGNOSIS — IMO0002 Reserved for concepts with insufficient information to code with codable children: Secondary | ICD-10-CM

## 2013-09-13 DIAGNOSIS — D0512 Intraductal carcinoma in situ of left breast: Secondary | ICD-10-CM

## 2013-09-13 NOTE — Progress Notes (Signed)
HISTORY OF PRESENT ILLNESS: Dr. Dion Saucier requested a cancer genetics consultation for Alison [name], a 68 y.o. female, due to a personal and family history of cancer.  Alison [name] presents to clinic today to discuss the possibility of a hereditary predisposition to cancer, genetic testing, and to further clarify her future cancer risks, as well as potential cancer risk for family members.   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 She has no history of other cancer.  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   H/O adenomatous polyp of colon    Esophageal stricture    Arthritis    PONV (postoperative nausea and vomiting)    Contact lens/glasses fitting     wears contacts or glasses   Vertigo    Radiation 05/16/13-06/13/13    Left breast    Hypertension    Cancer 03/2013    breast cancer    Past Surgical History  Procedure Laterality Date   Ovarian cyst removal     Tonsillectomy     Total hip arthroplasty  2005    right   Nissen fundoplication  5573   Upper gi endoscopy     Colonoscopy     Breast biopsy Left 04/17/2013    Procedure: BREAST BIOPSY WITH NEEDLE LOCALIZATION;  Surgeon: Odis Hollingshead, MD;  Location: [facility];  Service: General;  Laterality: Left;    History   Social History   Marital Status: Married    Spouse Name: Sheri    Number of Children: 0   Years of Education: [place]    Social History Main Topics   Smoking status: Former Smoker -- 5 years    Types: Cigarettes    Quit date: 08/10/1977   Smokeless tobacco: Never Used     Comment: She smoked for  5 years in her early 57s.     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     Alcohol Use:  Moderate   Drug Use:  None   Diet:  Regular   Exercise:  Active Life    Hobbies:  Fishing, [place], Golf               FAMILY HISTORY:  During the visit, a 4-generation pedigree was obtained. Significant diagnoses include the following:  Family History  Problem Relation Age of Onset   Heart attack Father    Lung cancer Mother 42    smoker   Cancer Other     breast cancer in a maternal full-aunt or half-aunt at 82    Alison [name]'s ancestry is of Caucasian descent. There is no known Jewish ancestry or  consanguinity.  GENETIC COUNSELING ASSESSMENT: Alison [name] is a 68 y.o. female with a personal and family history of cancer which is not suggestive of a hereditary predisposition to cancer. We, therefore, discussed and recommended the following at today's visit.   DISCUSSION / PLAN: We reviewed the characteristics, features and inheritance patterns of hereditary cancer syndromes. We also discussed genetic testing, including the appropriate family members to test, the process of testing, insurance coverage and turn-around-time for results.   We discussed with Alison [name] that although there is limited family structure, her personal and family history is not highly consistent with a familial hereditary cancer syndrome, and we feel she is at low risk to harbor a gene mutation associated with such a condition. We also discussed that she can still pursue genetic testing, but this would an out-of-pocket expense as she does  not meet Medicare criteria for genetic testing. Thus, we did not recommend any genetic testing, at this time.   We recommended Alison [name] continue to follow the cancer management and screening guidelines given to he by her primary provider.  Thank you for the referral and allowing [place] to share in the care of your patient.   The patient was seen for a total of 40 minutes in face-to-face genetic counseling.  This patient was discussed with Magrinat who agrees with the above.    _______________________________________________________________________ For Office Staff:  Number of people involved in session: 2 Was an Intern/ student involved with case: no

## 2013-09-19 ENCOUNTER — Ambulatory Visit (INDEPENDENT_AMBULATORY_CARE_PROVIDER_SITE_OTHER): Payer: Medicare Other | Admitting: Family Medicine

## 2013-09-19 ENCOUNTER — Encounter: Payer: Self-pay | Admitting: Family Medicine

## 2013-09-19 VITALS — BP 135/75 | HR 77 | Resp 16 | Ht 67.25 in | Wt 142.0 lb

## 2013-09-19 DIAGNOSIS — R319 Hematuria, unspecified: Secondary | ICD-10-CM

## 2013-09-19 DIAGNOSIS — B3731 Acute candidiasis of vulva and vagina: Secondary | ICD-10-CM

## 2013-09-19 DIAGNOSIS — B373 Candidiasis of vulva and vagina: Secondary | ICD-10-CM

## 2013-09-19 DIAGNOSIS — R3915 Urgency of urination: Secondary | ICD-10-CM

## 2013-09-19 MED ORDER — PHENAZOPYRIDINE HCL 200 MG PO TABS: 200.0000 mg | ORAL_TABLET | Freq: Three times a day (TID) | ORAL | Status: DC

## 2013-09-19 MED ORDER — TERCONAZOLE 0.8 % VA CREA: 1.0000 | TOPICAL_CREAM | Freq: Every day | VAGINAL | Status: DC

## 2013-09-19 MED ORDER — NITROFURANTOIN MONOHYD MACRO 100 MG PO CAPS: 100.0000 mg | ORAL_CAPSULE | Freq: Two times a day (BID) | ORAL | Status: DC

## 2013-09-19 MED ORDER — FLUCONAZOLE 150 MG PO TABS: 150.0000 mg | ORAL_TABLET | Freq: Once | ORAL | Status: DC

## 2013-09-19 NOTE — Patient Instructions (Signed)
1)  Blood in Urine - Take the Macrobid and Pyridium as directed.  If you develop a yeast infection, take the yeast meds.  If you do not grow anything then we'll get you to see a urologist.     Hematuria, Adult Hematuria is blood in your urine. It can be caused by a bladder infection, kidney infection, prostate infection, kidney stone, or cancer of your urinary tract. Infections can usually be treated with medicine, and a kidney stone usually will pass through your urine. If neither of these is the cause of your hematuria, further workup to find out the reason may be needed. It is very important that you tell your health care provider about any blood you see in your urine, even if the blood stops without treatment or happens without causing pain. Blood in your urine that happens and then stops and then happens again can be a symptom of a very serious condition. Also, pain is not a symptom in the initial stages of many urinary cancers. HOME CARE INSTRUCTIONS   Drink lots of fluid, 3-4 quarts a day. If you have been diagnosed with an infection, cranberry juice is especially recommended, in addition to large amounts of water.  Avoid caffeine, tea, and carbonated beverages, because they tend to irritate the bladder.  Avoid alcohol because it may irritate the prostate.  Only take over-the-counter or prescription medicines for pain, discomfort, or fever as directed by your health care provider.  If you have been diagnosed with a kidney stone, follow your health care provider's instructions regarding straining your urine to catch the stone.  Empty your bladder often. Avoid holding urine for long periods of time.  After a bowel movement, women should cleanse front to back. Use each tissue only once.  Empty your bladder before and after sexual intercourse if you are a female. SEEK MEDICAL CARE IF: You develop back pain, fever, a feeling of sickness in your stomach (nausea), or vomiting or if your  symptoms are not better in 3 days. Return sooner if you are getting worse. SEEK IMMEDIATE MEDICAL CARE IF:   You have a persistent fever, with a temperature of 101.12F (38.8C) or greater.  You develop severe vomiting and are unable to keep the medicine down.  You develop severe back or abdominal pain despite taking your medicines.  You begin passing a large amount of blood or clots in your urine.  You feel extremely weak or faint, or you pass out. MAKE SURE YOU:   Understand these instructions.  Will watch your condition.  Will get help right away if you are not doing well or get worse. Document Released: 02/23/2005 Document Revised: 12/14/2012 Document Reviewed: 10/24/2012 [facility] Patient Information 2015 [place], [place]. This information is not intended to replace advice given to you by your health care provider. Make sure you discuss any questions you have with your health care provider.

## 2013-09-19 NOTE — Progress Notes (Signed)
Subjective:    Patient ID: Alison [name], female    DOB: 08-Sep-1945, 68 y.o.   MRN: [id number]  HPI  Alison [name] is here today concerned about blood in her urine. She first  noticed it around 2 pm today when she went to urinate.  At first, she had bright red drops and now her urine is completely red.  She is now having burning, urgency and frequency.    Review of Systems  Genitourinary: Positive for dysuria, urgency, frequency and hematuria.  Musculoskeletal: Negative for back pain.  All other systems reviewed and are negative.    Past Medical History  Diagnosis Date  . GERD (gastroesophageal reflux disease)   . Internal hemorrhoids   . Esophagitis   . 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 H/O adenomatous polyp of colon   . Esophageal stricture   . Arthritis   . PONV (postoperative nausea and vomiting)   . Contact lens/glasses fitting     wears contacts or glasses  . Vertigo   . Radiation 05/16/13-06/13/13    Left breast   . Hypertension   . Cancer 03/2013    breast cancer     Past Surgical History  Procedure Laterality Date  . Ovarian cyst removal    . Tonsillectomy    . Total hip arthroplasty  2005    right  . Nissen fundoplication  4008  . Upper gi endoscopy    . Colonoscopy    . 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     Alcohol Use:  Moderate   Drug Use:  None   Diet:  Regular   Exercise:  Active Life    Hobbies:  Fishing, [place], Golf               Family History  Problem Relation Age of Onset    . Heart attack Father   . Lung cancer Mother 33    smoker  . Cancer Other     breast cancer in a maternal full-aunt or half-aunt at 59     Current Outpatient Prescriptions on File Prior to Visit  Medication Sig Dispense Refill  . meclizine (ANTIVERT) 25 MG tablet Take 1 tablet (25 mg total) by mouth 3 (three) times daily as needed for dizziness.  90 tablet  1  . metoprolol tartrate (LOPRESSOR) 25 MG tablet Take 1 tablet (25 mg total) by mouth 2 (two) times daily.  180 tablet  3  . omeprazole (PRILOSEC) 20 MG capsule Take 1 capsule (20 mg total) by mouth 2 (two) times daily.  180 capsule  3  . SUMAtriptan (IMITREX) 20 MG/ACT nasal spray Place 1 spray (20 mg total) into the nose every 2 (two) hours as needed for migraine.  8 Inhaler  11  . triamterene-hydrochlorothiazide (MAXZIDE-25) 37.5-25 MG per tablet Take 1 tablet by mouth daily.  90 tablet  3   No current facility-administered medications on file prior to visit.  Allergies  Allergen Reactions  . Adhesive [Tape]   . Morphine Rash  . Sulfonamide Derivatives Rash     Immunization History  Administered Date(s) Administered  . Influenza-Unspecified 12/27/2012  . Pneumococcal Conjugate-13 08/11/2013       Objective:   Physical Exam  Constitutional: No distress.  Cardiovascular: Normal rate and regular rhythm.   Pulmonary/Chest: Effort normal and breath sounds normal.  Abdominal: Soft. There is tenderness in the suprapubic area. There is no CVA tenderness.      Assessment & Plan:    Alison [name] was seen today for hematuria.  Diagnoses and associated orders for this visit:  Hematuria - Urine Culture  Dysuria - Urine Culture - nitrofurantoin, macrocrystal-monohydrate, (MACROBID) 100 MG capsule; Take 1 capsule (100 mg total) by mouth 2 (two) times daily. - phenazopyridine (PYRIDIUM) 200 MG tablet; Take 1 tablet (200 mg total) by mouth 3 (three) times daily with meals.  Urgency of urination - Urine  Culture  Candidiasis of female genitalia - fluconazole (DIFLUCAN) 150 MG tablet; Take 1 tablet (150 mg total) by mouth once. - terconazole (TERAZOL 3) 0.8 % vaginal cream; Place 1 applicator vaginally at bedtime.

## 2013-09-22 ENCOUNTER — Telehealth: Payer: Self-pay

## 2013-09-22 NOTE — Telephone Encounter (Signed)
Alison [name] called back and said she would like to be set up with the urologist that specializes in women issues

## 2013-09-23 LAB — URINE CULTURE: Colony Count: >=100000

## 2013-10-22 DIAGNOSIS — I10 Essential (primary) hypertension: Secondary | ICD-10-CM | POA: Insufficient documentation

## 2013-10-22 DIAGNOSIS — H8110 Benign paroxysmal vertigo, unspecified ear: Secondary | ICD-10-CM | POA: Insufficient documentation

## 2013-12-22 ENCOUNTER — Other Ambulatory Visit: Payer: Self-pay

## 2014-02-21 ENCOUNTER — Telehealth: Payer: Self-pay | Admitting: Oncology

## 2014-02-21 NOTE — Telephone Encounter (Signed)
S/w pt advised appt chg on 1/21 due to md pal. Pt states she is on the road and cannot take down appt. I've moved appt to 2/11 @ 3.15 with HF per md instructions and I have mailed an appt calendar. Pt is also mychart active.

## 2014-03-29 ENCOUNTER — Ambulatory Visit: Payer: Medicare Other | Admitting: Oncology

## 2014-04-12 ENCOUNTER — Other Ambulatory Visit: Payer: Self-pay

## 2014-04-19 ENCOUNTER — Ambulatory Visit (HOSPITAL_BASED_OUTPATIENT_CLINIC_OR_DEPARTMENT_OTHER): Payer: 59 | Admitting: Nurse Practitioner

## 2014-04-19 ENCOUNTER — Encounter: Payer: Self-pay | Admitting: Nurse Practitioner

## 2014-04-19 ENCOUNTER — Telehealth: Payer: Self-pay | Admitting: Nurse Practitioner

## 2014-04-19 VITALS — BP 133/67 | HR 93 | Temp 97.7°F | Resp 18 | Ht 67.0 in | Wt 148.7 lb

## 2014-04-19 DIAGNOSIS — N62 Hypertrophy of breast: Secondary | ICD-10-CM

## 2014-04-19 DIAGNOSIS — C50212 Malignant neoplasm of upper-inner quadrant of left female breast: Secondary | ICD-10-CM

## 2014-04-19 DIAGNOSIS — Z853 Personal history of malignant neoplasm of breast: Secondary | ICD-10-CM

## 2014-04-19 DIAGNOSIS — K2270 Barrett's esophagus without dysplasia: Secondary | ICD-10-CM

## 2014-04-19 NOTE — Telephone Encounter (Signed)
per pof to sch pt appt-cld & spoke to pt to adv of appt

## 2014-04-19 NOTE — Progress Notes (Signed)
[place]  [phone] Fax:([phone]) [phone]     ID: Alison [name] OB: Sep 05, 1945  MR#: [id number]  OAC#:[id number]  PCP: Ival Bible, MD GYN:   SU: Jackolyn Confer OTHER MD: Lance Morin  CHIEF COMPLAINT: left breast cancer CURRENT TREATMENT: observation. Refuses antiestrogen therapy.  BREAST CANCER HISTORY: Alison [name] had screening mammography 03/21/2013 suggesting a change in her left breast. Unilateral left diagnostic mammography 03/23/2013 showed a 3 mm cluster of calcifications in the left breast lower inner quadrant. There was a second cluster measuring 1.1 cm in the left breast upper inner quadrant. Stereotactic biopsy of the upper inner quadrant calcifications was attempted, but the calcifications could not be located could not be located stereotactically and the patient proceeded to left lumpectomy 04/17/2013. 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 The inferior margin was at 0.1 cm. (Dr. Constance Holster tomorrow took additional tissue from what the pathology report calls the medial margin, but apparently was the inferior margin, and this was clear).  Estrogen receptor was 100% positive. Progesterone receptor was 97% positive. Both showed strong staining intensity.  The patient proceeded to adjuvant radiation which was recently completed 06/13/2013.  Her subsequent history is as detailed below  INTERVAL HISTORY: Alison [name] returns today for follow up of her breast cancer, accompanied by her partner Alison [name]. Her visit was originally scheduled in January but was moved for scheduling reasons today. She had a routine mammogram last week that showed grouped round calcifications that were concerning. The first of these calcifications was biopsied, but showed atypical ductal hyperplasia. 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 She will return on 2/25 for this biopsy. The interval history is remarkable for her deciding against anti-estrogen therapy after discussing it at length with Dr. [name] in May of last year. She has a friend on antiestrogen therapy who "hurts everywhere" and is spotting despite being several years postmenopausal.   REVIEW OF SYSTEMS: Alison [name] denies fevers, chills, nausea, vomiting, or changes in bowel habits. She has some mild stress urinary incontinence. She is eating and drinking well. She exercises regularly and is physically active with house work. She denies shortness of breath, chest pain, cough, or palpitations. She some some mild hot flashes. She has back and joint pain managed with OTC meds and occasional migraines managed with imitrex. She has been having some trouble swallowing her own spit, but food and other liquids are no problems. She also has a "tickle" in her throat. She has a history of barrett esophagus and esophageal structure and is on omeprazole 20mg  BID. A detailed review of systems is otherwise stable.  PAST MEDICAL HISTORY: Past Medical History  Diagnosis Date  . GERD (gastroesophageal reflux disease)   . Internal hemorrhoids   . Esophagitis   . 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 H/O adenomatous polyp of colon   . Esophageal stricture   . Arthritis   . PONV (postoperative nausea and vomiting)   . Contact lens/glasses fitting     wears contacts or glasses  . Vertigo   . Radiation 05/16/13-06/13/13    Left breast   . Hypertension   . Cancer 03/2013    breast cancer    PAST SURGICAL HISTORY: Past Surgical History  Procedure Laterality Date  . Ovarian cyst removal    . Tonsillectomy    . Total hip  arthroplasty  2005    right  . Nissen fundoplication  2836  . Upper gi endoscopy    . Colonoscopy    . Breast biopsy Left 04/17/2013    Procedure: BREAST BIOPSY WITH NEEDLE LOCALIZATION;  Surgeon: Odis Hollingshead, MD;  Location: [facility];   Service: General;  Laterality: Left;    FAMILY HISTORY Family History  Problem Relation Age of Onset  . Heart attack Father   . Lung cancer Mother 12    smoker  . Cancer Other     breast cancer in a maternal full-aunt or half-aunt at 67   one of the patient's mother is 5 sisters (a maternal aunt) was diagnosed with breast cancer at the age of 5. There are no other breast cancers in the family to her knowledge, and no cases of ovarian cancer.  GYNECOLOGIC HISTORY:  Menarche age 75, the patient is GX P0. She went through the change of life more than 15 years ago. She did not take hormone replacement. 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, works as a Catering manager. They have no pets. The patient is not a church attender    ADVANCED DIRECTIVES: In place; Alison [name] is [employer] power of attorney   HEALTH MAINTENANCE: History  Substance Use Topics  . Smoking status: Former Smoker -- 5 years    Types: Cigarettes    Quit date: 08/10/1977  . Smokeless tobacco: Never Used     Comment: She smoked for 5 years in her early 57s.    . Alcohol Use: Yes     Colonoscopy: 2010/ Alison [name]  PAP: April 2014  Bone density: 2013; showed osteopenia  Lipid panel:  Allergies  Allergen Reactions  . Adhesive [Tape]   . Morphine Rash  . Sulfonamide Derivatives Rash    Current Outpatient Prescriptions  Medication Sig Dispense Refill  . meclizine (ANTIVERT) 25 MG tablet Take 1 tablet (25 mg total) by mouth 3 (three) times daily as needed for dizziness. 90 tablet 1  . metoprolol tartrate (LOPRESSOR) 25 MG tablet Take 1 tablet (25 mg total) by mouth 2 (two) times daily. 180 tablet 3  . omeprazole (PRILOSEC) 20 MG capsule Take 1 capsule (20 mg total) by mouth 2 (two) times daily. 180 capsule 3  . SUMAtriptan (IMITREX) 20 MG/ACT nasal spray Place 1 spray (20 mg total)  into the nose every 2 (two) hours as needed for migraine. 8 Inhaler 11   No current facility-administered medications for this visit.    OBJECTIVE: Middle-aged white woman who appears younger than stated age 54 Vitals:   04/19/14 1513  BP: 133/67  Pulse: 93  Temp: 97.7 F (36.5 C)  Resp: 18     Body mass index is 23.28 kg/(m^2).    ECOG FS:0 - Asymptomatic  Skin: warm, dry  HEENT: sclerae anicteric, conjunctivae pink, oropharynx clear. No thrush or mucositis.  Lymph Nodes: No cervical or supraclavicular lymphadenopathy  Lungs: clear to auscultation bilaterally, no rales, wheezes, or rhonci  Heart: regular rate and rhythm  Abdomen: round, soft, non tender, positive bowel sounds  Musculoskeletal: No focal spinal tenderness, no peripheral edema  Neuro: non focal, well oriented, positive affect  Breasts: left breast status post lumpectomy and radiation. No masses or lumps palpated. Left axilla benign. Right breast unremarkable.   LAB RESULTS:  CMP     Component  Value Date/Time   NA 139 07/27/2013 1558   NA 140 04/12/2013 1321   K 4.4 07/27/2013 1558   K 4.4 04/12/2013 1321   CL 102 04/12/2013 1321   CO2 25 07/27/2013 1558   CO2 25 04/12/2013 1321   GLUCOSE 135 07/27/2013 1558   GLUCOSE 63* 04/12/2013 1321   BUN 17.1 07/27/2013 1558   BUN 15 04/12/2013 1321   CREATININE 0.8 07/27/2013 1558   CREATININE 0.73 04/12/2013 1321   CREATININE 0.70 07/18/2012 0918   CALCIUM 9.5 07/27/2013 1558   CALCIUM 9.3 04/12/2013 1321   PROT 6.4 07/27/2013 1558   PROT 6.4 07/18/2012 0918   ALBUMIN 3.8 07/27/2013 1558   ALBUMIN 4.2 07/18/2012 0918   AST 13 07/27/2013 1558   AST 14 07/18/2012 0918   ALT 12 07/27/2013 1558   ALT 12 07/18/2012 0918   ALKPHOS 74 07/27/2013 1558   ALKPHOS 74 07/18/2012 0918   BILITOT 0.46 07/27/2013 1558   BILITOT 0.8 07/18/2012 0918   GFRNONAA 86* 04/12/2013 1321   GFRNONAA 89 07/18/2012 0918   GFRAA >90 04/12/2013 1321   GFRAA >89 07/18/2012 0918      I No results found for: SPEP  Lab Results  Component Value Date   WBC 6.6 07/27/2013   NEUTROABS 4.5 07/27/2013   HGB 13.5 07/27/2013   HCT 40.4 07/27/2013   MCV 89.9 07/27/2013   PLT 267 07/27/2013      Chemistry      Component Value Date/Time   NA 139 07/27/2013 1558   NA 140 04/12/2013 1321   K 4.4 07/27/2013 1558   K 4.4 04/12/2013 1321   CL 102 04/12/2013 1321   CO2 25 07/27/2013 1558   CO2 25 04/12/2013 1321   BUN 17.1 07/27/2013 1558   BUN 15 04/12/2013 1321   CREATININE 0.8 07/27/2013 1558   CREATININE 0.73 04/12/2013 1321   CREATININE 0.70 07/18/2012 0918      Component Value Date/Time   CALCIUM 9.5 07/27/2013 1558   CALCIUM 9.3 04/12/2013 1321   ALKPHOS 74 07/27/2013 1558   ALKPHOS 74 07/18/2012 0918   AST 13 07/27/2013 1558   AST 14 07/18/2012 0918   ALT 12 07/27/2013 1558   ALT 12 07/18/2012 0918   BILITOT 0.46 07/27/2013 1558   BILITOT 0.8 07/18/2012 0918       No results found for: LABCA2  No components found for: LABCA125  No results for input(s): INR in the last 168 hours.  Urinalysis No results found for: COLORURINE  STUDIES: No results found. Most recent mamm[facility] at [facility] on 04/10/14 showed "grouped round calcifications." Biopsy showed one area was "atypical ductal hyperplasia." A second area to be biopsied on 05/03/14  ASSESSMENT: 69 y.o. Jule Ser woman status post left lumpectomy 04/17/2013 for ductal carcinoma in situ grade 2 measuring 2.5 cm, estrogen and progesterone receptor positive with negative though close margins  (1) status post adjuvant radiation completed 06/13/2013  (2) osteopenia  (3) genetics testing pending  (4) currently refuses antiestrogen therapy  PLAN: Joselyn and I spent about 40 minutes reviewing her breast cancer in general, but also the results of her biopsy from last week. She holds firm in her decision to refuse antiestrogen therapy at this time, but may reconsider it if this 2nd biopsy does return  as DCIS. It is likely she would have a left mastectomy if this was the case. We discussed genetic testing and understands that given her personal and family history she is not eligible for testing  under medicare.    I encouraged Addilynn to visit her gastroenterologist with her new esophageal symptoms. It is possible she may need a dilation or to switch to protonix instead of omeprazole.   Mazikeen will return in early March for labs and a follow up visit after her biopsy results return. At this time they can discuss the results and maybe open up more discussion on antiestrogen therapy. She understands and agrees with this plan. She knows the goal of treatment in her case is cure. She has been encouraged to call with any issues that might arise before her next visit here.    Marcelino Duster, NP   04/19/2014 4:24 PM

## 2014-04-19 NOTE — Addendum Note (Signed)
Addended by: FERRELL, Okey Zelek L on: 04/19/2014 04:59 PM   Modules accepted: Orders  

## 2014-05-03 ENCOUNTER — Other Ambulatory Visit: Payer: Self-pay | Admitting: Radiology

## 2014-05-09 ENCOUNTER — Encounter: Payer: Self-pay | Admitting: Gastroenterology

## 2014-05-15 ENCOUNTER — Encounter (INDEPENDENT_AMBULATORY_CARE_PROVIDER_SITE_OTHER): Payer: Self-pay | Admitting: General Surgery

## 2014-05-15 DIAGNOSIS — N6092 Unspecified benign mammary dysplasia of left breast: Secondary | ICD-10-CM

## 2014-05-15 NOTE — Progress Notes (Signed)
Patient ID: ZION LINT, female   DOB: 02-20-1946, 69 y.o.   MRN: [id number] Alison [name]. Heft 05/15/2014 4:31 PM Location: [facility] Patient #: [id number] DOB: Sep 06, 1945 Married / Language: Cleophus Molt / Race: White Female History of Present Illness Alison Hollingshead MD; 05/15/2014 7:23 PM) Patient words: breast f/u.  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 Estrogen and progesterone receptor positive.  History: She is here for a follow up visit. Her mammogram 04/10/14 demonstrated some suspicious microcalcifications in the left breast at 12:00 medium depth. Image guided biopsy demonstrated ADH. Image guided biopsy of another area of microcalcifications at the 2:00 position of the left breast was done on 2/25 and was benign.  Exam: General- Is in NAD. Left breast-superior scar present, no masses.  Right breast-soft with no dominant masses.  Lymph nodes-no palpable cervical, supraclavicular or axillary adenopathy  .  Other Problems Marjean Donna, CMA; 05/15/2014 4:31 PM) Arthritis Back Pain Breast Cancer Gastroesophageal Reflux Disease Migraine Headache  Past Surgical History Marjean Donna, CMA; 05/15/2014 4:31 PM) Breast Biopsy Left. Breast Mass; Local Excision Left. Colon Polyp Removal - Colonoscopy Hip Surgery Right. Nissen Fundoplication  Diagnostic Studies History Marjean Donna, CMA; 05/15/2014 4:31 PM) Colonoscopy 1-5 years ago Mammogram 1-3 years ago  Allergies Marjean Donna, CMA; 05/15/2014 4:33 PM) Adhesive Tape *MEDICAL DEVICES AND SUPPLIES* Morphine Sulfate (Concentrate) *ANALGESICS - OPIOID* Sulfa Antibiotics  Medication History Davy Pique Bynum, CMA; 05/15/2014 4:33 PM) Omeprazole (20MG  Capsule DR, Oral) Active. SUMAtriptan (20MG /ACT Solution, Nasal) Active. Metoprolol Tartrate (25MG  Tablet, Oral)  Active. Medications Reconciled  Social History Marjean Donna, CMA; 05/15/2014 4:31 PM) Alcohol use Moderate alcohol use. Caffeine use Carbonated beverages, Coffee, Tea. No drug use Tobacco use Former smoker.  Family History Marjean Donna, CMA; 05/15/2014 4:31 PM) Arthritis Father. Heart Disease Father. Heart disease in female family member before age 80 Hypertension Father. Migraine Headache Father. Respiratory Condition Mother.  Pregnancy / Birth History Marjean Donna, [place]; 05/15/2014 4:31 PM) Age at menarche 49 years. Age of menopause >36 Gravida 0 Para 0     Review of Systems Davy Pique Bynum CMA; 05/15/2014 4:31 PM) General Not Present- Appetite Loss, Chills, Fatigue, Fever, Night Sweats, Weight Gain and Weight Loss. Skin Not Present- Change in Wart/Mole, Dryness, Hives, Jaundice, New Lesions, Non-Healing Wounds, Rash and Ulcer. HEENT Present- Wears glasses/contact lenses. Not Present- Earache, Hearing Loss, Hoarseness, Nose Bleed, Oral Ulcers, Ringing in the Ears, Seasonal Allergies, Sinus Pain, Sore Throat, Visual Disturbances and Yellow Eyes. Respiratory Not Present- Bloody sputum, Chronic Cough, Difficulty Breathing, Snoring and Wheezing. Breast Not Present- Breast Mass, Breast Pain, Nipple Discharge and Skin Changes. Cardiovascular Not Present- Chest Pain, Difficulty Breathing Lying Down, Leg Cramps, Palpitations, Rapid Heart Rate, Shortness of Breath and Swelling of Extremities. Gastrointestinal Not Present- Abdominal Pain, Bloating, Bloody Stool, Change in Bowel Habits, Chronic diarrhea, Constipation, Difficulty Swallowing, Excessive gas, Gets full quickly at meals, Hemorrhoids, Indigestion, Nausea, Rectal Pain and Vomiting. Female Genitourinary Not Present- Frequency, Nocturia, Painful Urination, Pelvic Pain and Urgency. Musculoskeletal Present- Joint Pain, Joint Stiffness and Muscle Weakness. Not Present- Back Pain, Muscle Pain and Swelling of Extremities. Neurological  Not Present- Decreased Memory, Fainting, Headaches, Numbness, Seizures, Tingling, Tremor, Trouble walking and Weakness. Psychiatric Not Present- Anxiety, Bipolar, Change in Sleep Pattern, Depression, Fearful and Frequent crying. Endocrine Not Present- Cold Intolerance, Excessive Hunger, Hair Changes, Heat Intolerance, Hot flashes and New Diabetes. Hematology  Not Present- Easy Bruising, Excessive bleeding, Gland problems, HIV and Persistent Infections.  Vitals (Sonya Bynum CMA; 05/15/2014 4:32 PM) 05/15/2014 4:32 PM Weight: 147 lb Height: 67in Body Surface Area: 1.78 m Body Mass Index: 23.02 kg/m Temp.: 97.66F(Temporal)  Pulse: 73 (Regular)  BP: 126/72 (Sitting, Left Arm, Standard)     Assessment & Plan Alison Hollingshead MD; 05/15/2014 7:23 PM)  DCIS (DUCTAL CARCINOMA IN SITU), LEFT (233.0  D05.12)  ATYPICAL DUCTAL HYPERPLASIA OF LEFT BREAST (610.8  N60.92) Impression: Assessment: Left breast ductal carcinoma in situ. Now has ADH at the 12:00 position of the left breast.  Plan: Left breast biopsy/lumpectomy after RSL. I have explained the procedure, risks, and aftercare to her. Risks include but are not limited to bleeding, infection, wound problems, seroma formation, anesthesia. She seems to [facility] and agrees with the plan  Current Plans Follow up as needed Free Text Instructions : discussed with patient and provided information.  Jackolyn Confer, MD

## 2014-05-16 ENCOUNTER — Telehealth: Payer: Self-pay | Admitting: Oncology

## 2014-05-16 ENCOUNTER — Ambulatory Visit (HOSPITAL_BASED_OUTPATIENT_CLINIC_OR_DEPARTMENT_OTHER): Payer: 59 | Admitting: Oncology

## 2014-05-16 ENCOUNTER — Other Ambulatory Visit (HOSPITAL_BASED_OUTPATIENT_CLINIC_OR_DEPARTMENT_OTHER): Payer: 59

## 2014-05-16 VITALS — BP 127/65 | HR 67 | Temp 97.7°F | Resp 18 | Ht 67.0 in | Wt 149.3 lb

## 2014-05-16 DIAGNOSIS — K2270 Barrett's esophagus without dysplasia: Secondary | ICD-10-CM

## 2014-05-16 DIAGNOSIS — Z860100 Personal history of colon polyps, unspecified: Secondary | ICD-10-CM

## 2014-05-16 DIAGNOSIS — C50212 Malignant neoplasm of upper-inner quadrant of left female breast: Secondary | ICD-10-CM

## 2014-05-16 DIAGNOSIS — C50912 Malignant neoplasm of unspecified site of left female breast: Secondary | ICD-10-CM

## 2014-05-16 DIAGNOSIS — D0512 Intraductal carcinoma in situ of left breast: Secondary | ICD-10-CM

## 2014-05-16 LAB — CBC WITH DIFFERENTIAL/PLATELET
BASO%: 0.8 % (ref 0.0–2.0)
Basophils Absolute: 0 10*3/uL (ref 0.0–0.1)
EOS%: 2.8 % (ref 0.0–7.0)
Eosinophils Absolute: 0.2 10*3/uL (ref 0.0–0.5)
HCT: 39 % (ref 34.8–46.6)
HGB: 13.1 g/dL (ref 11.6–15.9)
LYMPH#: 1.4 10*3/uL (ref 0.9–3.3)
LYMPH%: 25.5 % (ref 14.0–49.7)
MCH: 29.9 pg (ref 25.1–34.0)
MCHC: 33.6 g/dL (ref 31.5–36.0)
MCV: 89 fL (ref 79.5–101.0)
MONO#: 0.4 10*3/uL (ref 0.1–0.9)
MONO%: 7 % (ref 0.0–14.0)
NEUT#: 3.4 10*3/uL (ref 1.5–6.5)
NEUT%: 63.9 % (ref 38.4–76.8)
PLATELETS: 287 10*3/uL (ref 145–400)
RBC: 4.38 10*6/uL (ref 3.70–5.45)
RDW: 12.9 % (ref 11.2–14.5)
WBC: 5.3 10*3/uL (ref 3.9–10.3)

## 2014-05-16 LAB — COMPREHENSIVE METABOLIC PANEL (CC13)
ALT: 12 U/L (ref 0–55)
AST: 15 U/L (ref 5–34)
Albumin: 3.6 g/dL (ref 3.5–5.0)
Alkaline Phosphatase: 72 U/L (ref 40–150)
Anion Gap: 8 mEq/L (ref 3–11)
BILIRUBIN TOTAL: 0.57 mg/dL (ref 0.20–1.20)
BUN: 15.8 mg/dL (ref 7.0–26.0)
CALCIUM: 9.1 mg/dL (ref 8.4–10.4)
CO2: 25 mEq/L (ref 22–29)
Chloride: 110 mEq/L — ABNORMAL HIGH (ref 98–109)
Creatinine: 0.7 mg/dL (ref 0.6–1.1)
EGFR: 88 mL/min/{1.73_m2} — ABNORMAL LOW (ref >90)
GLUCOSE: 75 mg/dL (ref 70–140)
Potassium: 4 mEq/L (ref 3.5–5.1)
SODIUM: 142 meq/L (ref 136–145)
TOTAL PROTEIN: 6.1 g/dL — AB (ref 6.4–8.3)

## 2014-05-16 MED ORDER — TAMOXIFEN CITRATE 20 MG PO TABS: 20.0000 mg | ORAL_TABLET | Freq: Every day | ORAL | Status: AC

## 2014-05-16 NOTE — Progress Notes (Signed)
Alison [name]  [phone] [phone]) [phone]     ID: Alison [name] OB: 09-02-1945  MR#: [id number]  XKG#:[id number]  PCP: Alison Bible, MD GYN:   SU: Alison [name] OTHER MD: Alison [name]  CHIEF COMPLAINT: left breast cancer CURRENT TREATMENT: observation. Refuses antiestrogen therapy.  BREAST CANCER HISTORY: Alison [name] had screening mammography 03/21/2013 suggesting a change in her left breast. Unilateral left diagnostic mammography 03/23/2013 showed a 3 mm cluster of calcifications in the left breast lower inner quadrant. There was a second cluster measuring 1.1 cm in the left breast upper inner quadrant. Stereotactic biopsy of the upper inner quadrant calcifications was attempted, but the calcifications could not be located could not be located stereotactically and the patient proceeded to left lumpectomy 04/17/2013. 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 The inferior margin was at 0.1 cm. (Dr. Constance [name] tomorrow took additional tissue from what the pathology report calls the medial margin, but apparently was the inferior margin, and this was clear).  Estrogen receptor was 100% positive. Progesterone receptor was 97% positive. Both showed strong staining intensity.  The patient proceeded to adjuvant radiation which was recently completed 06/13/2013.  Her subsequent history is as detailed below  INTERVAL HISTORY: Alison [name] returns today for follow up of her breast cancer, accompanied by her partner Alison [name].  Since her last visit here mammography in February of this year showed  An area of suspicious microcalcifications superiorly. Biopsy showed ADH ([id number]).  She is scheduled 4 resection under Dr. Clarise [name] in the near future.  REVIEW OF SYSTEMS: Alison [name] her radiation treatments well, with minimal erythema, no desquamation, and no significant fatigue. She continues to have heartburn problems, but up to her  Prilosec to 20 mg twice daily and that has helped. She continues to have back and joint pain which is not a new problem and it is not more intense or persistent than before. A detailed review of systems today was otherwise noncontributory  PAST MEDICAL HISTORY: Past Medical History  Diagnosis Date  . GERD (gastroesophageal reflux disease)   . Internal hemorrhoids   . Esophagitis   . 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 H/O adenomatous polyp of colon   . Esophageal stricture   . Arthritis   . PONV (postoperative nausea and vomiting)   . Contact lens/glasses fitting     wears contacts or glasses  . Vertigo   . Radiation 05/16/13-06/13/13    Left breast   . Hypertension   . Breast cancer 03/2013  . Hiatal hernia   . Adenomatous colon polyp   . Migraine headache     PAST SURGICAL HISTORY: Past Surgical History  Procedure Laterality Date  . Ovarian cyst removal    . Tonsillectomy    . Total hip arthroplasty Right 2005  . Nissen fundoplication  8588  . Upper gi endoscopy    . Colonoscopy    . Breast biopsy Left 04/17/2013    Procedure: BREAST BIOPSY WITH NEEDLE LOCALIZATION;  Surgeon: Odis Hollingshead, MD;  Location: [facility];  Service: General;  Laterality: Left;    FAMILY HISTORY Family History  Problem Relation Age of Onset  . Heart attack Father   . Lung cancer Mother 99    smoker  . Breast cancer Maternal Aunt     half-aunt at 75   one of the patient's mother is 5 sisters (a maternal aunt) was diagnosed with breast cancer at the age of 2.  There are no other breast cancers in the family to her knowledge, and no cases of ovarian cancer.  GYNECOLOGIC HISTORY:  Menarche age 53, the patient is GX P0. She went through the change of life more than 15 years ago. She did not take hormone replacement. 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,  works as a Catering manager. They have no pets. The patient is not a church attender    ADVANCED DIRECTIVES: In place; Alison [name] is [employer] power of attorney   HEALTH MAINTENANCE: History  Substance Use Topics  . Smoking status: Former Smoker -- 5 years    Types: Cigarettes    Quit date: 08/10/1977  . Smokeless tobacco: Never Used     Comment: She smoked for 5 years in her early 24s.    . Alcohol Use: Yes     Colonoscopy: 2010/ [facility]  PAP: April 2014  Bone density: 2013; showed osteopenia  Lipid panel:  Allergies  Allergen Reactions  . Adhesive [Tape]   . Morphine Rash  . Sulfonamide Derivatives Rash    Current Outpatient Prescriptions  Medication Sig Dispense Refill  . meclizine (ANTIVERT) 25 MG tablet Take 1 tablet (25 mg total) by mouth 3 (three) times daily as needed for dizziness. 90 tablet 1  . metoprolol tartrate (LOPRESSOR) 25 MG tablet Take 1 tablet (25 mg total) by mouth 2 (two) times daily. 180 tablet 3  . omeprazole (PRILOSEC) 20 MG capsule Take 1 capsule (20 mg total) by mouth 2 (two) times daily. 180 capsule 3  . SUMAtriptan (IMITREX) 20 MG/ACT nasal spray Place 1 spray (20 mg total) into the nose every 2 (two) hours as needed for migraine. 8 Inhaler 11   No current facility-administered medications for this visit.    OBJECTIVE: Middle-aged white woman  In no acute distress Filed Vitals:   05/16/14 1013  BP: 127/65  Pulse: 67  Temp: 97.7 F (36.5 C)  Resp: 18     Body mass index is 23.38 kg/(m^2).    ECOG FS:0 - Asymptomatic  Sclerae unicteric, pupils equal and reactive Oropharynx clear and moist-- no thrush or other lesions No cervical or supraclavicular adenopathy Lungs no rales or rhonchi Heart regular rate and rhythm Abd soft, nontender, positive bowel sounds MSK no focal spinal tenderness, no upper extremity lymphedema Neuro: nonfocal, well oriented, appropriate affect Breasts:  The right breast is  Unremarkable. The left breast is  status post lumpectomy and radiation. There is no evidence of local recurrence.  There are palpable masses.There is only minimal residual hyperpigmentation. The left axilla is benign.    LAB RESULTS:  CMP     Component Value Date/Time   NA 139 07/27/2013 1558   NA 140 04/12/2013 1321   K 4.4 07/27/2013 1558   K 4.4 04/12/2013 1321   CL 102 04/12/2013 1321   CO2 25 07/27/2013 1558   CO2 25 04/12/2013 1321   GLUCOSE 135 07/27/2013 1558   GLUCOSE 63* 04/12/2013 1321   BUN 17.1 07/27/2013 1558   BUN 15 04/12/2013 1321   CREATININE 0.8 07/27/2013 1558   CREATININE 0.73 04/12/2013 1321   CREATININE 0.70 07/18/2012 0918   CALCIUM 9.5 07/27/2013 1558   CALCIUM 9.3 04/12/2013 1321   PROT 6.4 07/27/2013 1558   PROT 6.4 07/18/2012 0918   ALBUMIN 3.8 07/27/2013 1558   ALBUMIN 4.2 07/18/2012 6599  AST 13 07/27/2013 1558   AST 14 07/18/2012 0918   ALT 12 07/27/2013 1558   ALT 12 07/18/2012 0918   ALKPHOS 74 07/27/2013 1558   ALKPHOS 74 07/18/2012 0918   BILITOT 0.46 07/27/2013 1558   BILITOT 0.8 07/18/2012 0918   GFRNONAA 86* 04/12/2013 1321   GFRNONAA 89 07/18/2012 0918   GFRAA >90 04/12/2013 1321   GFRAA >89 07/18/2012 0918    I No results found for: SPEP  Lab Results  Component Value Date   WBC 5.3 05/16/2014   NEUTROABS 3.4 05/16/2014   HGB 13.1 05/16/2014   HCT 39.0 05/16/2014   MCV 89.0 05/16/2014   PLT 287 05/16/2014      Chemistry      Component Value Date/Time   NA 139 07/27/2013 1558   NA 140 04/12/2013 1321   K 4.4 07/27/2013 1558   K 4.4 04/12/2013 1321   CL 102 04/12/2013 1321   CO2 25 07/27/2013 1558   CO2 25 04/12/2013 1321   BUN 17.1 07/27/2013 1558   BUN 15 04/12/2013 1321   CREATININE 0.8 07/27/2013 1558   CREATININE 0.73 04/12/2013 1321   CREATININE 0.70 07/18/2012 0918      Component Value Date/Time   CALCIUM 9.5 07/27/2013 1558   CALCIUM 9.3 04/12/2013 1321   ALKPHOS 74 07/27/2013 1558   ALKPHOS 74 07/18/2012 0918   AST 13  07/27/2013 1558   AST 14 07/18/2012 0918   ALT 12 07/27/2013 1558   ALT 12 07/18/2012 0918   BILITOT 0.46 07/27/2013 1558   BILITOT 0.8 07/18/2012 0918       No results found for: LABCA2  No components found for: LABCA125  No results for input(s): INR in the last 168 hours.  Urinalysis No results found for: COLORURINE  STUDIES: No results found. Most recent mammogram at [facility] on 04/10/14 showed "grouped round calcifications." Biopsy showed one area was "atypical ductal hyperplasia." A second area to be biopsied on 05/03/14  ASSESSMENT: 69 y.o. Jule Ser woman status post left lumpectomy 04/17/2013 for ductal carcinoma in situ grade 2 measuring 2.5 cm, estrogen and progesterone receptor positive with negative though close margins  (1) status post adjuvant radiation completed 06/13/2013  (2) osteopenia  (3) genetics testing pending  (4)  Opted againstantiestrogen therapy   (5) biopsy of suspicious area in the left breast February 2016 showed atypical ductal hyperplasia  PLAN:  we spent approximately 30 minutes going over her situation. We reviewed the fact that her tumor was noninvasive and therefore anti-estrogens are not alive for death matter for her. They do however cut in half her risk of recurrence and she is already having another biopsy in the same breast.   Accordingly she is willing to give both tamoxifen and anastrozole and other looks. We discussed the possible toxicities, side effects and complications of these agents as well as the possible benefits. After much discussion she decided she would like to give tamoxifen a trial. I have gone ahead and put in that prescription and I have made her an optional appointment here in 3 months.   If she is happy with the medication and has no questions she can cancel that and I will see her again in March of next year. If she stops and has no questions that spine to and I would see her again in March. If she needs to discuss  whether or not to stay on tamoxifen however that I would see her in 3 months.   Stephene has  a good understanding of this plan. She agrees with it. She knows to call for any problems that may develop before her next visit here.   Chauncey Cruel, MD   05/16/2014 10:25 AM

## 2014-05-16 NOTE — Telephone Encounter (Signed)
per pof to sch pt appt-gave pt copy of sch °

## 2014-05-17 LAB — FOLLICLE STIMULATING HORMONE: FSH: 60.2 m[IU]/mL

## 2014-05-20 LAB — ESTRADIOL, ULTRA SENS

## 2014-05-23 ENCOUNTER — Encounter: Payer: Self-pay | Admitting: Internal Medicine

## 2014-05-23 ENCOUNTER — Ambulatory Visit (INDEPENDENT_AMBULATORY_CARE_PROVIDER_SITE_OTHER): Payer: 59 | Admitting: Internal Medicine

## 2014-05-23 VITALS — BP 116/60 | HR 68 | Ht 67.0 in | Wt 149.2 lb

## 2014-05-23 DIAGNOSIS — K219 Gastro-esophageal reflux disease without esophagitis: Secondary | ICD-10-CM

## 2014-05-23 DIAGNOSIS — Z9889 Other specified postprocedural states: Secondary | ICD-10-CM

## 2014-05-23 DIAGNOSIS — K2270 Barrett's esophagus without dysplasia: Secondary | ICD-10-CM

## 2014-05-23 MED ORDER — PANTOPRAZOLE SODIUM 40 MG PO TBEC: 40.0000 mg | DELAYED_RELEASE_TABLET | Freq: Two times a day (BID) | ORAL | Status: DC

## 2014-05-23 NOTE — Patient Instructions (Addendum)
Per Dr. Olevia Perches discontinue Prilosec and begin Pantoprazole, which I have sent to your pharmacy  You may purchase Gaviscon and take 1-2 chews as needed for heartburn. Dr Rosemary Holms

## 2014-05-23 NOTE — Progress Notes (Signed)
Alison [name] 11-May-1945 [id number]  Note: This dictation was prepared with Dragon digital system. Any transcriptional errors that result from this procedure are unintentional.   History of Present Illness: This is a 69 year old white female with history of severe gastroesophageal reflux who underwent Nissen fundoplication by Dr. Hassell Done in August 2006 around a [place] dilator. She was prior to that dilated for esophageal stricture in 2001. Since then her reflux has improved tremendously but she still has issues with globus sensation in her throat. In 2009 she was diagnosed with Barrett's esophagus but subsequent endoscopy in November 2013 showed only chronic inflammation at GE junction. She denies dysphagia but has occasional postnasal drip and coughing mucous material. About a month ago she experienced an episode of severe globus sensation and burning and she decided to increase her Prilosec to 40 mg twice a day which helped within 2 or 3 days. She then decreased her Prilosec to 20 milligrams twice a day but the burning recurred. She is currently on Prilosec 20 mg 3 times a day and her symptoms have been reasonably controlled. She denies taking anti-inflammatory agents. She thinks that she may have to switch from Prilosec to another PPI which would be more effective. Patient is up-to-date on colonoscopy. She had adenomatous polyp in 2001, no polyp in 2006 and internal hemorrhoids in  2009.    Past Medical History  Diagnosis Date  . GERD (gastroesophageal reflux disease)   . Internal hemorrhoids   . Esophagitis   . 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 H/O adenomatous polyp of colon   . Esophageal stricture   . Arthritis   . PONV (postoperative nausea and vomiting)   . Contact lens/glasses fitting     wears contacts or glasses  . Vertigo   . Radiation 05/16/13-06/13/13    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  . Hypertension   . Breast cancer 03/2013  . Hiatal hernia   . Adenomatous colon polyp   .  Migraine headache     Past Surgical History  Procedure Laterality Date  . Ovarian cyst removal    . Tonsillectomy    . Total hip arthroplasty Right 2005  . Nissen fundoplication  3299  . Upper gi endoscopy    . Colonoscopy    . Breast biopsy Left 04/17/2013    Procedure: BREAST BIOPSY WITH NEEDLE LOCALIZATION;  Surgeon: Odis Hollingshead, MD;  Location: [place];  Service: General;  Laterality: Left;    Allergies  Allergen Reactions  . Adhesive [Tape]   . Morphine Rash  . Sulfonamide Derivatives Rash    Family history and social history have been reviewed.  Review of Systems: Negative for dysphagia. Positive for burning sensation and globus sensation in the back of her throat. Denies asthma shortness of breath or chest pain  The remainder of the 10 point ROS is negative except as outlined in the H&P  Physical Exam: General Appearance Well developed, in no distress Eyes  Non icteric  HEENT  Non traumatic, normocephalic , normal voice. No cough Mouth No lesion, tongue papillated, no cheilosis Neck Supple without adenopathy, thyroid not enlarged, no carotid bruits, no JVD Lungs Clear to auscultation bilaterally COR Normal S1, normal S2, regular rhythm, no murmur, quiet precordium Abdomen soft nontender with normoactive bowel sounds. Post Nissen fundoplication scars. Rectal not done Extremities  No pedal edema Skin No lesions Neurological Alert and oriented x 3 Psychological Normal mood and affect  Assessment and Plan:   69 year old white female  with a history of chronic gastroesophageal reflux necessitating Nissen fundoplication in 9417. She has had recent onset of progressive reflux symptoms for no clear reason. We will  switch from Prilosec to Protonix 40 mg twice a day and add Gaviscon to use on when necessary basis.If symptoms continue ,consider either barium esophagram or repeat upper endoscopy.  Colorectal screening. Repeat and colonoscopy in  2019   [name] 05/23/2014

## 2014-07-10 ENCOUNTER — Other Ambulatory Visit: Payer: Self-pay

## 2014-07-10 ENCOUNTER — Encounter (HOSPITAL_BASED_OUTPATIENT_CLINIC_OR_DEPARTMENT_OTHER): Payer: Self-pay | Admitting: *Deleted

## 2014-07-10 ENCOUNTER — Encounter (HOSPITAL_BASED_OUTPATIENT_CLINIC_OR_DEPARTMENT_OTHER)
Admission: RE | Admit: 2014-07-10 | Discharge: 2014-07-10 | Disposition: A | Discharge status: Home / Self Care | Payer: Medicare Other | Source: Ambulatory Visit | Attending: General Surgery | Admitting: General Surgery

## 2014-07-10 DIAGNOSIS — Z87891 Personal history of nicotine dependence: Secondary | ICD-10-CM | POA: Diagnosis not present

## 2014-07-10 DIAGNOSIS — N6092 Unspecified benign mammary dysplasia of left breast: Secondary | ICD-10-CM | POA: Diagnosis not present

## 2014-07-10 DIAGNOSIS — I10 Essential (primary) hypertension: Secondary | ICD-10-CM | POA: Diagnosis not present

## 2014-07-10 DIAGNOSIS — K219 Gastro-esophageal reflux disease without esophagitis: Secondary | ICD-10-CM | POA: Diagnosis not present

## 2014-07-10 DIAGNOSIS — G43909 Migraine, unspecified, not intractable, without status migrainosus: Secondary | ICD-10-CM | POA: Diagnosis not present

## 2014-07-10 DIAGNOSIS — Z882 Allergy status to sulfonamides status: Secondary | ICD-10-CM | POA: Diagnosis not present

## 2014-07-10 DIAGNOSIS — Z885 Allergy status to narcotic agent status: Secondary | ICD-10-CM | POA: Diagnosis not present

## 2014-07-10 DIAGNOSIS — Z79899 Other long term (current) drug therapy: Secondary | ICD-10-CM | POA: Diagnosis not present

## 2014-07-10 DIAGNOSIS — Z853 Personal history of malignant neoplasm of breast: Secondary | ICD-10-CM | POA: Diagnosis not present

## 2014-07-10 LAB — COMPREHENSIVE METABOLIC PANEL
ALT: 15 U/L (ref 14–54)
ANION GAP: 9 (ref 5–15)
AST: 18 U/L (ref 15–41)
Albumin: 3.7 g/dL (ref 3.5–5.0)
Alkaline Phosphatase: 62 U/L (ref 38–126)
BILIRUBIN TOTAL: 0.7 mg/dL (ref 0.3–1.2)
BUN: 13 mg/dL (ref 6–20)
CHLORIDE: 105 mmol/L (ref 101–111)
CO2: 27 mmol/L (ref 22–32)
Calcium: 9 mg/dL (ref 8.9–10.3)
Creatinine, Ser: 0.92 mg/dL (ref 0.44–1.00)
GFR calc Af Amer: >60 mL/min (ref >60)
GFR calc non Af Amer: >60 mL/min (ref >60)
Glucose, Bld: 96 mg/dL (ref 70–99)
Potassium: 3.8 mmol/L (ref 3.5–5.1)
Sodium: 141 mmol/L (ref 135–145)
Total Protein: 6.2 g/dL — ABNORMAL LOW (ref 6.5–8.1)

## 2014-07-10 LAB — CBC WITH DIFFERENTIAL/PLATELET
Basophils Absolute: 0 10*3/uL (ref 0.0–0.1)
Basophils Relative: 1 % (ref 0–1)
EOS ABS: 0.1 10*3/uL (ref 0.0–0.7)
Eosinophils Relative: 2 % (ref 0–5)
HCT: 39.9 % (ref 36.0–46.0)
HEMOGLOBIN: 13.4 g/dL (ref 12.0–15.0)
Lymphocytes Relative: 22 % (ref 12–46)
Lymphs Abs: 1.2 10*3/uL (ref 0.7–4.0)
MCH: 29.8 pg (ref 26.0–34.0)
MCHC: 33.6 g/dL (ref 30.0–36.0)
MCV: 88.9 fL (ref 78.0–100.0)
MONO ABS: 0.3 10*3/uL (ref 0.1–1.0)
MONOS PCT: 5 % (ref 3–12)
NEUTROS PCT: 70 % (ref 43–77)
Neutro Abs: 3.8 10*3/uL (ref 1.7–7.7)
Platelets: 256 10*3/uL (ref 150–400)
RBC: 4.49 MIL/uL (ref 3.87–5.11)
RDW: 13.2 % (ref 11.5–15.5)
WBC: 5.3 10*3/uL (ref 4.0–10.5)

## 2014-07-10 LAB — PROTIME-INR
INR: 1.05 (ref 0.00–1.49)
PROTHROMBIN TIME: 13.9 s (ref 11.6–15.2)

## 2014-07-10 NOTE — Progress Notes (Signed)
To come in for ekg-bmet 

## 2014-07-10 NOTE — Progress Notes (Signed)
Pt had concern of irregular rate at times associated with no sx for several years, Dr Lissa Hoard into see pt. Reviewed ekg and spoke with pt relieving her concerns.  May have surgery here at [place]

## 2014-07-12 ENCOUNTER — Ambulatory Visit (HOSPITAL_BASED_OUTPATIENT_CLINIC_OR_DEPARTMENT_OTHER)
Admission: RE | Admit: 2014-07-12 | Discharge: 2014-07-12 | Disposition: A | Discharge status: Home / Self Care | Payer: Medicare Other | Source: Ambulatory Visit | Attending: General Surgery | Admitting: General Surgery

## 2014-07-12 ENCOUNTER — Encounter (HOSPITAL_BASED_OUTPATIENT_CLINIC_OR_DEPARTMENT_OTHER)
Admission: RE | Disposition: A | Discharge status: Home / Self Care | Payer: Self-pay | Source: Ambulatory Visit | Attending: General Surgery

## 2014-07-12 ENCOUNTER — Encounter (HOSPITAL_BASED_OUTPATIENT_CLINIC_OR_DEPARTMENT_OTHER): Payer: Self-pay | Admitting: Certified Registered"

## 2014-07-12 ENCOUNTER — Ambulatory Visit (HOSPITAL_BASED_OUTPATIENT_CLINIC_OR_DEPARTMENT_OTHER): Payer: Medicare Other | Admitting: Certified Registered"

## 2014-07-12 DIAGNOSIS — N6092 Unspecified benign mammary dysplasia of left breast: Secondary | ICD-10-CM | POA: Diagnosis not present

## 2014-07-12 DIAGNOSIS — Z87891 Personal history of nicotine dependence: Secondary | ICD-10-CM | POA: Insufficient documentation

## 2014-07-12 DIAGNOSIS — K219 Gastro-esophageal reflux disease without esophagitis: Secondary | ICD-10-CM | POA: Diagnosis not present

## 2014-07-12 DIAGNOSIS — G43909 Migraine, unspecified, not intractable, without status migrainosus: Secondary | ICD-10-CM | POA: Insufficient documentation

## 2014-07-12 DIAGNOSIS — Z885 Allergy status to narcotic agent status: Secondary | ICD-10-CM | POA: Insufficient documentation

## 2014-07-12 DIAGNOSIS — I10 Essential (primary) hypertension: Secondary | ICD-10-CM | POA: Diagnosis not present

## 2014-07-12 DIAGNOSIS — Z882 Allergy status to sulfonamides status: Secondary | ICD-10-CM | POA: Insufficient documentation

## 2014-07-12 DIAGNOSIS — Z853 Personal history of malignant neoplasm of breast: Secondary | ICD-10-CM | POA: Insufficient documentation

## 2014-07-12 DIAGNOSIS — Z79899 Other long term (current) drug therapy: Secondary | ICD-10-CM | POA: Insufficient documentation

## 2014-07-12 HISTORY — PX: BREAST LUMPECTOMY WITH RADIOACTIVE SEED LOCALIZATION: SHX6424

## 2014-07-12 SURGERY — BREAST LUMPECTOMY WITH RADIOACTIVE SEED LOCALIZATION
Anesthesia: General | Site: Breast | Laterality: Left

## 2014-07-12 MED ORDER — ACETAMINOPHEN 10 MG/ML IV SOLN
1000.0000 mg | Freq: Once | INTRAVENOUS | Status: AC
  Administered 2014-07-12: 1000 mg via INTRAVENOUS

## 2014-07-12 MED ORDER — ACETAMINOPHEN 650 MG RE SUPP: 650.0000 mg | RECTAL | Status: DC | PRN

## 2014-07-12 MED ORDER — CEFAZOLIN SODIUM-DEXTROSE 2-3 GM-% IV SOLR
INTRAVENOUS | Status: AC
  Filled 2014-07-12: qty 50

## 2014-07-12 MED ORDER — SODIUM CHLORIDE 0.9 % IJ SOLN: 3.0000 mL | Freq: Two times a day (BID) | INTRAMUSCULAR | Status: DC

## 2014-07-12 MED ORDER — FENTANYL CITRATE (PF) 100 MCG/2ML IJ SOLN
INTRAMUSCULAR | Status: DC | PRN
  Administered 2014-07-12 (×2): 50 ug via INTRAVENOUS

## 2014-07-12 MED ORDER — ONDANSETRON HCL 4 MG PO TABS: 4.0000 mg | ORAL_TABLET | ORAL | Status: DC | PRN

## 2014-07-12 MED ORDER — BUPIVACAINE-EPINEPHRINE (PF) 0.25% -1:200000 IJ SOLN
INTRAMUSCULAR | Status: AC
  Filled 2014-07-12: qty 30

## 2014-07-12 MED ORDER — ONDANSETRON HCL 4 MG/2ML IJ SOLN
INTRAMUSCULAR | Status: DC | PRN
  Administered 2014-07-12: 4 mg via INTRAVENOUS

## 2014-07-12 MED ORDER — PROPOFOL INFUSION 10 MG/ML OPTIME
INTRAVENOUS | Status: DC | PRN
  Administered 2014-07-12: 200 ug/kg/min via INTRAVENOUS

## 2014-07-12 MED ORDER — HYDROCODONE-ACETAMINOPHEN 5-325 MG PO TABS: 1.0000 | ORAL_TABLET | ORAL | Status: DC | PRN

## 2014-07-12 MED ORDER — DEXAMETHASONE SODIUM PHOSPHATE 4 MG/ML IJ SOLN
INTRAMUSCULAR | Status: DC | PRN
  Administered 2014-07-12: 10 mg via INTRAVENOUS

## 2014-07-12 MED ORDER — LIDOCAINE HCL (CARDIAC) 20 MG/ML IV SOLN
INTRAVENOUS | Status: DC | PRN
  Administered 2014-07-12: 60 mg via INTRAVENOUS

## 2014-07-12 MED ORDER — OXYCODONE HCL 5 MG PO TABS: 5.0000 mg | ORAL_TABLET | ORAL | Status: DC | PRN

## 2014-07-12 MED ORDER — BUPIVACAINE HCL (PF) 0.5 % IJ SOLN
INTRAMUSCULAR | Status: AC
  Filled 2014-07-12: qty 30

## 2014-07-12 MED ORDER — DEXTROSE IN LACTATED RINGERS 5 % IV SOLN: INTRAVENOUS | Status: DC

## 2014-07-12 MED ORDER — SODIUM CHLORIDE 0.9 % IV SOLN: 250.0000 mL | INTRAVENOUS | Status: DC | PRN

## 2014-07-12 MED ORDER — CEFAZOLIN SODIUM-DEXTROSE 2-3 GM-% IV SOLR
2.0000 g | INTRAVENOUS | Status: AC
  Administered 2014-07-12 (×2): 2 g via INTRAVENOUS

## 2014-07-12 MED ORDER — LACTATED RINGERS IV SOLN
INTRAVENOUS | Status: DC
  Administered 2014-07-12: 07:00:00 via INTRAVENOUS

## 2014-07-12 MED ORDER — MIDAZOLAM HCL 2 MG/2ML IJ SOLN
INTRAMUSCULAR | Status: AC
  Filled 2014-07-12: qty 2

## 2014-07-12 MED ORDER — BUPIVACAINE HCL (PF) 0.25 % IJ SOLN
INTRAMUSCULAR | Status: AC
  Filled 2014-07-12: qty 30

## 2014-07-12 MED ORDER — SCOPOLAMINE 1 MG/3DAYS TD PT72
MEDICATED_PATCH | TRANSDERMAL | Status: AC
  Filled 2014-07-12: qty 1

## 2014-07-12 MED ORDER — SODIUM CHLORIDE 0.9 % IR SOLN
Status: DC | PRN
  Administered 2014-07-12: 1000 mL

## 2014-07-12 MED ORDER — PROPOFOL 500 MG/50ML IV EMUL
INTRAVENOUS | Status: AC
  Filled 2014-07-12: qty 50

## 2014-07-12 MED ORDER — PROPOFOL 10 MG/ML IV BOLUS
INTRAVENOUS | Status: DC | PRN
  Administered 2014-07-12: 130 mg via INTRAVENOUS

## 2014-07-12 MED ORDER — BUPIVACAINE HCL (PF) 0.5 % IJ SOLN
INTRAMUSCULAR | Status: DC | PRN
  Administered 2014-07-12: 16 mL

## 2014-07-12 MED ORDER — ACETAMINOPHEN 325 MG PO TABS: 650.0000 mg | ORAL_TABLET | ORAL | Status: DC | PRN

## 2014-07-12 MED ORDER — SODIUM CHLORIDE 0.9 % IJ SOLN: 3.0000 mL | INTRAMUSCULAR | Status: DC | PRN

## 2014-07-12 MED ORDER — FENTANYL CITRATE (PF) 100 MCG/2ML IJ SOLN
INTRAMUSCULAR | Status: AC
  Filled 2014-07-12: qty 4

## 2014-07-12 MED ORDER — LIDOCAINE-EPINEPHRINE 0.5 %-1:200000 IJ SOLN
INTRAMUSCULAR | Status: AC
  Filled 2014-07-12: qty 1

## 2014-07-12 MED ORDER — SCOPOLAMINE 1 MG/3DAYS TD PT72
MEDICATED_PATCH | TRANSDERMAL | Status: DC | PRN
  Administered 2014-07-12: 1 via TRANSDERMAL

## 2014-07-12 MED ORDER — SODIUM BICARBONATE 4 % IV SOLN
INTRAVENOUS | Status: AC
  Filled 2014-07-12: qty 5

## 2014-07-12 SURGICAL SUPPLY — 49 items
APL SKNCLS STERI-STRIP NONHPOA (GAUZE/BANDAGES/DRESSINGS) ×1
BENZOIN TINCTURE PRP APPL 2/3 (GAUZE/BANDAGES/DRESSINGS) ×3 IMPLANT
BINDER BREAST MEDIUM (GAUZE/BANDAGES/DRESSINGS) ×2 IMPLANT
BLADE SURG 15 STRL LF DISP TIS (BLADE) ×1 IMPLANT
BLADE SURG 15 STRL SS (BLADE) ×3
CHLORAPREP W/TINT 26ML (MISCELLANEOUS) ×3 IMPLANT
CLOSURE WOUND 1/2 X4 (GAUZE/BANDAGES/DRESSINGS) ×1
COVER BACK TABLE 60X90IN (DRAPES) ×3 IMPLANT
COVER MAYO STAND STRL (DRAPES) ×3 IMPLANT
COVER PROBE W GEL 5X96 (DRAPES) ×3 IMPLANT
DEVICE DUBIN W/COMP PLATE 8390 (MISCELLANEOUS) ×2 IMPLANT
DRAPE LAPAROTOMY 100X72 PEDS (DRAPES) ×3 IMPLANT
DRAPE UTILITY XL STRL (DRAPES) ×3 IMPLANT
DRSG TELFA 3X8 NADH (GAUZE/BANDAGES/DRESSINGS) ×3 IMPLANT
ELECT COATED BLADE 2.86 ST (ELECTRODE) ×3 IMPLANT
ELECT REM PT RETURN 9FT ADLT (ELECTROSURGICAL) ×3
ELECTRODE REM PT RTRN 9FT ADLT (ELECTROSURGICAL) ×1 IMPLANT
GAUZE SPONGE 4X4 12PLY STRL (GAUZE/BANDAGES/DRESSINGS) ×2 IMPLANT
GLOVE BIO SURGEON STRL SZ 6.5 (GLOVE) ×1 IMPLANT
GLOVE BIO SURGEONS STRL SZ 6.5 (GLOVE) ×1
GLOVE BIOGEL PI IND STRL 6.5 (GLOVE) IMPLANT
GLOVE BIOGEL PI IND STRL 8.5 (GLOVE) ×1 IMPLANT
GLOVE BIOGEL PI INDICATOR 6.5 (GLOVE) ×4
GLOVE BIOGEL PI INDICATOR 8.5 (GLOVE) ×2
GLOVE ECLIPSE 8.0 STRL XLNG CF (GLOVE) ×3 IMPLANT
GLOVE SURG SS PI 6.5 STRL IVOR (GLOVE) ×2 IMPLANT
GOWN STRL REUS W/ TWL LRG LVL3 (GOWN DISPOSABLE) ×2 IMPLANT
GOWN STRL REUS W/TWL LRG LVL3 (GOWN DISPOSABLE) ×9
KIT MARKER MARGIN INK (KITS) ×2 IMPLANT
LIQUID BAND (GAUZE/BANDAGES/DRESSINGS) ×2 IMPLANT
NDL HYPO 25X1 1.5 SAFETY (NEEDLE) ×1 IMPLANT
NEEDLE HYPO 25X1 1.5 SAFETY (NEEDLE) ×3 IMPLANT
NS IRRIG 1000ML POUR BTL (IV SOLUTION) ×3 IMPLANT
PACK BASIN DAY SURGERY FS (CUSTOM PROCEDURE TRAY) ×3 IMPLANT
PAD DRESSING TELFA 3X8 NADH (GAUZE/BANDAGES/DRESSINGS) IMPLANT
PENCIL BUTTON HOLSTER BLD 10FT (ELECTRODE) ×3 IMPLANT
SLEEVE SCD COMPRESS KNEE MED (MISCELLANEOUS) ×2 IMPLANT
SPONGE GAUZE 4X4 12PLY STER LF (GAUZE/BANDAGES/DRESSINGS) ×2 IMPLANT
SPONGE LAP 4X18 X RAY DECT (DISPOSABLE) ×3 IMPLANT
STRIP CLOSURE SKIN 1/2X4 (GAUZE/BANDAGES/DRESSINGS) ×2 IMPLANT
SUT MON AB 4-0 PC3 18 (SUTURE) ×3 IMPLANT
SUT SILK 2 0 FS (SUTURE) ×1 IMPLANT
SUT VICRYL 3-0 CR8 SH (SUTURE) ×3 IMPLANT
SYR CONTROL 10ML LL (SYRINGE) ×3 IMPLANT
TOWEL OR 17X24 6PK STRL BLUE (TOWEL DISPOSABLE) ×8 IMPLANT
TOWEL OR NON WOVEN STRL DISP B (DISPOSABLE) ×3 IMPLANT
TUBE CONNECTING 20'X1/4 (TUBING) ×1
TUBE CONNECTING 20X1/4 (TUBING) ×1 IMPLANT
YANKAUER SUCT BULB TIP NO VENT (SUCTIONS) ×2 IMPLANT

## 2014-07-12 NOTE — Interval H&P Note (Signed)
History and Physical Interval Note:  07/12/2014 7:23 AM  Alison [name]  has presented today for surgery, with the diagnosis of LEFT BREAST ADH  The various methods of treatment have been discussed with the patient and family. After consideration of risks, benefits and other options for treatment, the patient has consented to  Procedure(s): LEFT BREAST LUMPECTOMY WITH RADIOACTIVE SEED LOCALIZATION (Left) as a surgical intervention .  The patient's history has been reviewed, patient examined, no change in status, stable for surgery.  I have reviewed the patient's chart and labs.  Questions were answered to the patient's satisfaction.     Rhyan Radler Lenna Sciara

## 2014-07-12 NOTE — Anesthesia Postprocedure Evaluation (Signed)
  Anesthesia Post-op Note  Patient: Alison [name]  Procedure(s) Performed: Procedure(s): LEFT BREAST LUMPECTOMY WITH RADIOACTIVE SEED LOCALIZATION (Left)  Patient Location: PACU  Anesthesia Type:General  Level of Consciousness: awake, alert  and oriented  Airway and Oxygen Therapy: Patient Spontanous Breathing  Post-op Pain: none  Post-op Assessment: Post-op Vital signs reviewed, Patient's Cardiovascular Status Stable, Respiratory Function Stable, Patent Airway, No signs of Nausea or vomiting and Pain level controlled  Post-op Vital Signs: Reviewed and stable  Last Vitals:  Filed Vitals:   07/12/14 0915  BP: 124/88  Pulse: 70  Temp: 36.6 C  Resp: 14    Complications: No apparent anesthesia complications

## 2014-07-12 NOTE — H&P (Signed)
Alison [name] is an 69 y.o. female.   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  She was noted to have suspicious microcalcifications at the 12:00 position in the left breast.  These were biopsied and demonstrated ADH.  She is here for wider excision.  Past Medical History  Diagnosis Date  . GERD (gastroesophageal reflux disease)   . Internal hemorrhoids   . Esophagitis   . 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 H/O adenomatous polyp of colon   . Esophageal stricture   . Arthritis   . PONV (postoperative nausea and vomiting)   . Contact lens/glasses fitting     wears contacts or glasses  . Vertigo   . Radiation 05/16/13-06/13/13    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  . Hypertension   . Breast cancer 03/2013  . Hiatal hernia   . Adenomatous colon polyp   . Migraine headache     Past Surgical History  Procedure Laterality Date  . Ovarian cyst removal    . Tonsillectomy    . Total hip arthroplasty Right 2005  . Nissen fundoplication  9509  . Upper gi endoscopy    . Colonoscopy    . Breast biopsy Left 04/17/2013    Procedure: BREAST BIOPSY WITH NEEDLE LOCALIZATION;  Surgeon: Odis Hollingshead, MD;  Location: [place];  Service: General;  Laterality: Left;    Family History  Problem Relation Age of Onset  . Heart attack Father   . Lung cancer Mother 71    smoker  . Breast cancer Maternal Aunt     half-aunt at 87   Social History:  reports that she quit smoking about 36 years ago. Her smoking use included Cigarettes. She quit after 5 years of use. She has never used smokeless tobacco. She reports that she drinks alcohol. She reports that she does not use illicit drugs.  Allergies:  Allergies  Allergen Reactions  . Adhesive [Tape]   . Morphine Rash  . Sulfonamide Derivatives Rash    Medications Prior to Admission  Medication Sig Dispense Refill  . Calcium Carb-Cholecalciferol (CALCIUM + D3 PO)  Take by mouth.    . metoprolol tartrate (LOPRESSOR) 25 MG tablet Take 1 tablet (25 mg total) by mouth 2 (two) times daily. 180 tablet 3  . Milk Thistle 1000 MG CAPS Take by mouth.    . Omega-3 Fatty Acids (FISH OIL) 1000 MG CAPS Take by mouth.    . pantoprazole (PROTONIX) 40 MG tablet Take 1 tablet (40 mg total) by mouth 2 (two) times daily. 180 tablet 3  . Probiotic Product (PROBIOTIC ACIDOPHILUS) CAPS Take by mouth.    . SUMAtriptan (IMITREX) 20 MG/ACT nasal spray Place 1 spray (20 mg total) into the nose every 2 (two) hours as needed for migraine. 8 Inhaler 11  . tamoxifen (NOLVADEX) 10 MG tablet Take 10 mg by mouth 2 (two) times daily.    . TURMERIC PO Take by mouth.    . vitamin B-12 (CYANOCOBALAMIN) 100 MCG tablet Take 100 mcg by mouth daily.    . meclizine (ANTIVERT) 25 MG tablet Take 1 tablet (25 mg total) by mouth 3 (three) times daily as needed for dizziness. 90 tablet 1    Results for orders placed or performed during the hospital encounter of 07/12/14 (from the past 48 hour(s))  CBC WITH DIFFERENTIAL     Status: None   Collection Time: 07/10/14 11:55 AM  Result  Value Ref Range   WBC 5.3 4.0 - 10.5 K/uL   RBC 4.49 3.87 - 5.11 MIL/uL   Hemoglobin 13.4 12.0 - 15.0 g/dL   HCT 39.9 36.0 - 46.0 %   MCV 88.9 78.0 - 100.0 fL   MCH 29.8 26.0 - 34.0 pg   MCHC 33.6 30.0 - 36.0 g/dL   RDW 13.2 11.5 - 15.5 %   Platelets 256 150 - 400 K/uL   Neutrophils Relative % 70 43 - 77 %   Neutro Abs 3.8 1.7 - 7.7 K/uL   Lymphocytes Relative 22 12 - 46 %   Lymphs Abs 1.2 0.7 - 4.0 K/uL   Monocytes Relative 5 3 - 12 %   Monocytes Absolute 0.3 0.1 - 1.0 K/uL   Eosinophils Relative 2 0 - 5 %   Eosinophils Absolute 0.1 0.0 - 0.7 K/uL   Basophils Relative 1 0 - 1 %   Basophils Absolute 0.0 0.0 - 0.1 K/uL  Comprehensive metabolic panel     Status: Abnormal   Collection Time: 07/10/14 11:55 AM  Result Value Ref Range   Sodium 141 135 - 145 mmol/L   Potassium 3.8 3.5 - 5.1 mmol/L   Chloride 105  101 - 111 mmol/L   CO2 27 22 - 32 mmol/L   Glucose, Bld 96 70 - 99 mg/dL   BUN 13 6 - 20 mg/dL   Creatinine, Ser 0.92 0.44 - 1.00 mg/dL   Calcium 9.0 8.9 - 10.3 mg/dL   Total Protein 6.2 (L) 6.5 - 8.1 g/dL   Albumin 3.7 3.5 - 5.0 g/dL   AST 18 15 - 41 U/L   ALT 15 14 - 54 U/L   Alkaline Phosphatase 62 38 - 126 U/L   Total Bilirubin 0.7 0.3 - 1.2 mg/dL   GFR calc non Af Amer >60 >60 mL/min   GFR calc Af Amer >60 >60 mL/min    Comment: (NOTE) The eGFR has been calculated using the CKD EPI equation. This calculation has not been validated in all clinical situations. eGFR's persistently <90 mL/min signify possible Chronic Kidney Disease.    Anion gap 9 5 - 15  Protime-INR     Status: None   Collection Time: 07/10/14 11:55 AM  Result Value Ref Range   Prothrombin Time 13.9 11.6 - 15.2 seconds   INR 1.05 0.00 - 1.49   No results found.  Review of Systems  Constitutional: Negative for fever and chills.  Gastrointestinal: Negative for nausea and vomiting.    Blood pressure 113/65, pulse 50, temperature 97.8 F (36.6 C), temperature source Oral, resp. rate 18, height $RemoveBe'5\' 8"'oalRoTEhW$  (1.727 m), weight 65.772 kg (145 lb), SpO2 100 %. Physical Exam  Constitutional: She appears well-developed and well-nourished. No distress.  HENT:  Head: Normocephalic and atraumatic.  Cardiovascular: Normal rate and regular rhythm.   Respiratory: Effort normal and breath sounds normal.  Left breast scar.  Clear bandage on left breast.  Neurological: She is alert.  Psychiatric: She has a normal mood and affect. Her behavior is normal.     Assessment/Plan Left breast lumpectomy after RSL.  Cameryn Chrisley J 07/12/2014, 7:15 AM

## 2014-07-12 NOTE — Discharge Instructions (Addendum)
[place] Office Phone Number [phone]  BREAST BIOPSY/ PARTIAL MASTECTOMY: POST OP INSTRUCTIONS  Always review your discharge instruction sheet given to you by the facility where your surgery was performed.  IF YOU HAVE DISABILITY OR FAMILY LEAVE FORMS, YOU MUST BRING THEM TO THE OFFICE FOR PROCESSING.  DO NOT GIVE THEM TO YOUR DOCTOR.  1. A prescription for pain medication may be given to you upon discharge.  Take your pain medication as prescribed, if needed.  If narcotic pain medicine is not needed, then you may take acetaminophen (Tylenol) or ibuprofen (Advil) as needed. 2. Take your usually prescribed medications unless otherwise directed 3. If you need a refill on your pain medication, please contact your pharmacy.  They will contact our office to request authorization.  Prescriptions will not be filled after 5pm or on week-ends. 4. You should eat very light the first 24 hours after surgery, such as soup, crackers, pudding, etc.  Resume your normal diet the day after surgery. 5. Most patients will experience some swelling and bruising in the breast.  Ice packs and a good support bra will help.  Swelling and bruising can take several days to resolve.  6. It is common to experience some constipation if taking pain medication after surgery.  Increasing fluid intake and taking a stool softener will usually help or prevent this problem from occurring.  A mild laxative (Milk of Magnesia or Miralax) should be taken according to package directions if there are no bowel movements after 48 hours. 7. Unless discharge instructions indicate otherwise, you may remove your bandages 48 hours after surgery, and you may shower at that time.  You may have steri-strips (small skin tapes) in place directly over the incision.  These strips should be left on the skin for 7-10 days.  If your surgeon used skin glue on the incision, you may shower in 24 hours.  The glue will flake off over the next  2-3 weeks.  Any sutures or staples will be removed at the office during your follow-up visit. 8. ACTIVITIES:  Light activities for 5 days.  Resume normal activities after that when you are pain-free..  Wearing a good support bra or sports bra minimizes pain and swelling.  You may have sexual intercourse when it is comfortable. a. You may drive when you no longer are taking prescription pain medication, you can comfortably wear a seatbelt, and you can safely maneuver your car and apply brakes. b. RETURN TO WORK:  ______________________________________________________________________________________ 9. You should see your doctor in the office for a follow-up appointment approximately two weeks after your surgery.  Please call to make the appointment. 10. OTHER INSTRUCTIONS: _______________________________________________________________________________________________ _____________________________________________________________________________________________________________________________________ _____________________________________________________________________________________________________________________________________ _____________________________________________________________________________________________________________________________________  WHEN TO CALL YOUR DOCTOR: 1. Fever over 101.0 2. Nausea and/or vomiting. 3. Extreme swelling or bruising. 4. Continued bleeding from incision. 5. Increased pain, redness, or drainage from the incision.  The clinic staff is available to answer your questions during regular business hours.  Please dont hesitate to call and ask to speak to one of the nurses for clinical concerns.  If you have a medical emergency, go to the nearest emergency room or call 911.  A surgeon from [facility] is always on call at the hospital.  For further questions, please visit [email or web address]  Post Anesthesia Home Care  Instructions  Activity: Get plenty of rest for the remainder of the day. A responsible adult should stay with you for 24 hours following the procedure.  For the next 24  hours, DO NOT: -Drive a car -Paediatric nurse -Drink alcoholic beverages -Take any medication unless instructed by your physician -Make any legal decisions or sign important papers.  Meals: Start with liquid foods such as gelatin or soup. Progress to regular foods as tolerated. Avoid greasy, spicy, heavy foods. If nausea and/or vomiting occur, drink only clear liquids until the nausea and/or vomiting subsides. Call your physician if vomiting continues.  Special Instructions/Symptoms: Your throat may feel dry or sore from the anesthesia or the breathing tube placed in your throat during surgery. If this causes discomfort, gargle with warm salt water. The discomfort should disappear within 24 hours.  If you had a scopolamine patch placed behind your ear for the management of post- operative nausea and/or vomiting:  1. The medication in the patch is effective for 72 hours, after which it should be removed.  Wrap patch in a tissue and discard in the trash. Wash hands thoroughly with soap and water. 2. You may remove the patch earlier than 72 hours if you experience unpleasant side effects which may include dry mouth, dizziness or visual disturbances. 3. Avoid touching the patch. Wash your hands with soap and water after contact with the patch.

## 2014-07-12 NOTE — Anesthesia Preprocedure Evaluation (Signed)
Anesthesia Evaluation  Patient identified by MRN, date of birth, ID band Patient awake    Reviewed: Allergy & Precautions, NPO status , Patient's Chart, lab work & pertinent test results, reviewed documented beta blocker date and time   History of Anesthesia Complications (+) PONV and history of anesthetic complications  Airway Mallampati: I  TM Distance: >3 FB Neck ROM: Full    Dental  (+) Teeth Intact   Pulmonary neg shortness of breath, neg sleep apnea, neg COPDneg recent URI, former smoker,  breath sounds clear to auscultation        Cardiovascular hypertension, Pt. on medications and Pt. on home beta blockers Rhythm:Regular     Neuro/Psych  Headaches,  Neuromuscular disease negative psych ROS   GI/Hepatic hiatal hernia, GERD-  Controlled and Medicated,  Endo/Other  negative endocrine ROS  Renal/GU negative Renal ROS     Musculoskeletal  (+) Arthritis -,   Abdominal   Peds  Hematology negative hematology ROS (+)   Anesthesia Other Findings   Reproductive/Obstetrics                             Anesthesia Physical Anesthesia Plan  ASA: II  Anesthesia Plan: General   Post-op Pain Management:    Induction: Intravenous  Airway Management Planned: LMA  Additional Equipment: None  Intra-op Plan:   Post-operative Plan: Extubation in OR  Informed Consent: I have reviewed the patients History and Physical, chart, labs and discussed the procedure including the risks, benefits and alternatives for the proposed anesthesia with the patient or authorized representative who has indicated his/her understanding and acceptance.   Dental advisory given  Plan Discussed with: CRNA and Surgeon  Anesthesia Plan Comments:         Anesthesia Quick Evaluation

## 2014-07-12 NOTE — Transfer of Care (Signed)
Immediate Anesthesia Transfer of Care Note  Patient: Alison [name]  Procedure(s) Performed: Procedure(s): LEFT BREAST LUMPECTOMY WITH RADIOACTIVE SEED LOCALIZATION (Left)  Patient Location: PACU  Anesthesia Type:General  Level of Consciousness: awake, alert , oriented and patient cooperative  Airway & Oxygen Therapy: Patient Spontanous Breathing and Patient connected to face mask oxygen  Post-op Assessment: Report given to RN, Post -op Vital signs reviewed and stable and Patient moving all extremities  Post vital signs: Reviewed and stable  Last Vitals:  Filed Vitals:   07/12/14 0625  BP: 113/65  Pulse: 50  Temp: 36.6 C  Resp: 18    Complications: No apparent anesthesia complications

## 2014-07-12 NOTE — Op Note (Signed)
Operative Note  Alison [name] female 69 y.o. 07/12/2014  PREOPERATIVE DX:  Left breast atypical ductal hyperplasia  POSTOPERATIVE DX:  Same  PROCEDURE:   Left breast lumpectomy after radioactive seed localization         Surgeon: Odis Hollingshead   Assistants: none  Anesthesia: General LMA anesthesia with Marcaine local  Indications:   This is a 69 year old female status post lumpectomy of the left breast for DCIS in the upper inner quadrant. She subsequently had radiation therapy. On her annual mammogram she is noted to have suspicious microcalcifications at the 12:00 area. Image guided biopsy demonstrated atypical ductal hyperplasia. Wider excision is recommended and she presents for that. She has had a radioactive seed implanted for localization.    Procedure Detail:  She was seen in the holding area and the left breast marked with my initials. Using the neoprobe, I verified that the seed was in the left breast. She was brought to the operating room placed supine on the operating table and a general anesthetic was administered. The left breast was sterilely prepped and draped. A timeout was performed.  Using a neoprobe in the mammograms as a guide I marked the area of increased counts which was approximately the 12:30 position of the left breast 2 cm superior to the nipple. Local anesthetic was infiltrated in a curvilinear in fashion in the skin. A curvilinear superior incision was made through the skin and dermis. Subcutaneous flaps were raised in all directions using electrocautery. Using the neoprobe as a guide lumpectomy was performed in order to try to keep the area of highest counts in the middle of the specimen. Once the specimen was removed, the margins were marked with ink. Specimen mammogram was performed in the area of concern as well as the biopsy clips and radioactive seed were contained. Specimen was sent to pathology. Pathology verified retrieving the seed.  Local  anesthetic consisting of Marcaine was infiltrated into the subcutaneous tissues at the lumpectomy site. Hemostasis was adequate. The subcutaneous tissue was reapproximated with interrupted 3-0 Vicryl sutures. The skin was closed with a running 4-0 Monocryl subcuticular stitch. Adhesive glue was applied. A bulky dressing was applied. A breast binder was applied.  She tolerated the procedure well without any apparent complications and was taken to the recovery room in satisfactory condition.         Specimens: left breast tissue        Complications:  * No complications entered in OR log *         Disposition: PACU - hemodynamically stable.         Condition: stable

## 2014-07-12 NOTE — Anesthesia Procedure Notes (Signed)
Procedure Name: LMA Insertion Date/Time: 07/12/2014 7:49 AM Performed by: Baxter Flattery Pre-anesthesia Checklist: Patient identified, Emergency Drugs available, Suction available, Patient being monitored and Timeout performed Patient Re-evaluated:Patient Re-evaluated prior to inductionOxygen Delivery Method: Circle System Utilized Preoxygenation: Pre-oxygenation with 100% oxygen Intubation Type: IV induction Ventilation: Mask ventilation without difficulty LMA: LMA inserted LMA Size: 4.0 Number of attempts: 1 Airway Equipment and Method: Bite block Placement Confirmation: positive ETCO2 and breath sounds checked- equal and bilateral Tube secured with: Tape Dental Injury: Teeth and Oropharynx as per pre-operative assessment

## 2014-07-13 ENCOUNTER — Encounter (HOSPITAL_BASED_OUTPATIENT_CLINIC_OR_DEPARTMENT_OTHER): Payer: Self-pay | Admitting: General Surgery

## 2014-07-27 ENCOUNTER — Telehealth: Payer: Self-pay | Admitting: Internal Medicine

## 2014-07-27 NOTE — Telephone Encounter (Signed)
Spoke with patient and she is having left and right lower abdominal discomfort. States the pain is "shooting aches." Denies constipation or diarrhea. Scheduled with Dr. Olevia Perches on 08/21/14 at 1:15 PM.

## 2014-08-21 ENCOUNTER — Ambulatory Visit (INDEPENDENT_AMBULATORY_CARE_PROVIDER_SITE_OTHER): Payer: Medicare Other | Admitting: Internal Medicine

## 2014-08-21 ENCOUNTER — Telehealth: Payer: Self-pay | Admitting: Internal Medicine

## 2014-08-21 ENCOUNTER — Other Ambulatory Visit (INDEPENDENT_AMBULATORY_CARE_PROVIDER_SITE_OTHER): Payer: Medicare Other

## 2014-08-21 ENCOUNTER — Encounter: Payer: Self-pay | Admitting: Internal Medicine

## 2014-08-21 VITALS — BP 96/60 | HR 80 | Ht 67.75 in | Wt 144.2 lb

## 2014-08-21 DIAGNOSIS — R1030 Lower abdominal pain, unspecified: Secondary | ICD-10-CM

## 2014-08-21 LAB — CREATININE, SERUM: Creatinine, Ser: 0.73 mg/dL (ref 0.40–1.20)

## 2014-08-21 LAB — BUN: BUN: 14 mg/dL (ref 6–23)

## 2014-08-21 MED ORDER — DICYCLOMINE HCL 10 MG PO CAPS: 10.0000 mg | ORAL_CAPSULE | Freq: Two times a day (BID) | ORAL | Status: DC

## 2014-08-21 NOTE — Progress Notes (Signed)
TYMIRA HORKEY 12-29-1945 [id number]  Note: This dictation was prepared with Dragon digital system. Any transcriptional errors that result from this procedure are unintentional.   History of Present Illness: This is a 69 year old white female with the several week history of crampy lower abdominal pain she had initial episode about a month ago and then got better for several days and then she had recurrence of the pain last night. It is not associated with bowel movements which are regular. She denies rectal bleeding or weight loss. We have been following her for colorectal screening since 2001 when she had adenomatous polyp removed. In 2006 7 no polyps. Last colonoscopy in 2009 showed internal hemorrhoids. We have also seen her for gastroesophageal reflux and Barrett's esophagus on upper endoscopy in 2009. She underwent Nissen fundoplication in March 9628 and around [place] dilator. Last endoscopy in November 2013 showed no Barrett's. Has taken intermittently Protonix and Prilosec currently  Prilosec 20 mg twice a day. She was on tamoxifen after breast cancer but has discontinued it , since  she suspected it was causing her lower abdominal pain. She has an appointment with Dr.Magrinat tomorrow     Past Medical History  Diagnosis Date  . GERD (gastroesophageal reflux disease)   . Internal hemorrhoids   . Esophagitis   . 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 H/O adenomatous polyp of colon   . Esophageal stricture   . Arthritis   . PONV (postoperative nausea and vomiting)   . Contact lens/glasses fitting     wears contacts or glasses  . Vertigo   . Radiation 05/16/13-06/13/13    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  . Hypertension   . Breast cancer 03/2013  . Hiatal hernia   . Adenomatous colon polyp   . Migraine headache     Past Surgical History  Procedure Laterality Date  . Ovarian cyst removal    . Tonsillectomy    . Total hip arthroplasty Right 2005  . Nissen fundoplication  3662  . Upper  gi endoscopy    . Colonoscopy    . Breast biopsy Left 04/17/2013    Procedure: BREAST BIOPSY WITH NEEDLE LOCALIZATION;  Surgeon: Odis Hollingshead, MD;  Location: [place];  Service: General;  Laterality: Left;  . Breast lumpectomy with radioactive seed localization Left 07/12/2014    Procedure: LEFT BREAST LUMPECTOMY WITH RADIOACTIVE SEED LOCALIZATION;  Surgeon: Jackolyn Confer, MD;  Location: [place];  Service: General;  Laterality: Left;    Allergies  Allergen Reactions  . Adhesive [Tape]   . Morphine Rash  . Sulfonamide Derivatives Rash    Family history and social history have been reviewed.  Review of Systems: Regular bowel movements. Weight stable. Denies rectal bleeding  The remainder of the 10 point ROS is negative except as outlined in the H&P  Physical Exam: General Appearance Well developed, in no distress Eyes  Non icteric  HEENT  Non traumatic, normocephalic  Mouth No lesion, tongue papillated, no cheilosis Neck Supple without adenopathy, thyroid not enlarged, no carotid bruits, no JVD Lungs Clear to auscultation bilaterally COR Normal S1, normal S2, regular rhythm, no murmur, quiet precordium Abdomen Soft. Good muscular support. Post Nissen fundoplication scars. Hyperactive bowel sounds with some rushes. No tenderness to palpation. Mild tympany  Rectal Small amount of soft Hemoccult-negative stool  Extremities  No pedal edema Skin No lesions Neurological Alert and oriented x 3 Psychological Normal mood and affect  Assessment and Plan:   69 year old white  female with the change in bowel habits and vague lower abdominal pain which could be consistent with a variety of problems such as irritable bowel syndrome. Symptomatic diverticulosis or intraabdominal process.. We will go ahead with CT scan of the abdomen and pelvis to rule out malignant process. We will also start Bentyl 10 mg twice a day. Depending on the response we will consider  repeating colonoscopy. She will continue probiotics  Breast carcinoma, followed by Dr [name], She took herself off Tamoxifen. GERD, s/p Nissen, under good control     [name] 08/21/2014

## 2014-08-21 NOTE — Patient Instructions (Addendum)
Dr Dion Saucier, Dr [name]  Your physician has requested that you go to the basement for lab work before leaving today.  We have sent  medications to your pharmacy for you to pick up at your convenience.   You have been scheduled for a CT scan of the abdomen and pelvis at [facility] ([address]---this is in the same building as [facility]).   You are scheduled on 08/22/14 at 230 pm. You should arrive 15 minutes prior to your appointment time for registration. Please follow the written instructions below on the day of your exam:  WARNING: IF YOU ARE ALLERGIC TO IODINE/X-RAY DYE, PLEASE NOTIFY RADIOLOGY IMMEDIATELY AT [phone]! YOU WILL BE GIVEN A 13 HOUR PREMEDICATION PREP.  1) Do not eat or drink anything after 1030 am (4 hours prior to your test) 2) You have been given 2 bottles of oral contrast to drink. The solution may taste better if refrigerated, but do NOT add ice or any other liquid to this solution. Shake well before drinking.    Drink 1 bottle of contrast @ 1230 pm (2 hours prior to your exam)  Drink 1 bottle of contrast @ 130 pm (1 hour prior to your exam)  You may take any medications as prescribed with a small amount of water except for the following: Metformin, Glucophage, Glucovance, Avandamet, Riomet, Fortamet, Actoplus Met, Janumet, Glumetza or Metaglip. The above medications must be held the day of the exam AND 48 hours after the exam.  The purpose of you drinking the oral contrast is to aid in the visualization of your intestinal tract. The contrast solution may cause some diarrhea. Before your exam is started, you will be given a small amount of fluid to drink. Depending on your individual set of symptoms, you may also receive an intravenous injection of x-ray contrast/dye. Plan on being at [facility] for 30 minutes or long, depending on the type of exam you are having performed.  This test typically takes 30-45 minutes to complete.  If  you have any questions regarding your exam or if you need to reschedule, you may call the CT department at [phone] between the hours of 8:00 am and 5:00 pm, Monday-Friday.  ________________________________________________________________________

## 2014-08-22 ENCOUNTER — Other Ambulatory Visit: Payer: Self-pay | Admitting: Gynecology

## 2014-08-22 ENCOUNTER — Inpatient Hospital Stay: Admission: RE | Admit: 2014-08-22 | Payer: Medicare Other | Source: Ambulatory Visit

## 2014-08-22 ENCOUNTER — Ambulatory Visit (HOSPITAL_BASED_OUTPATIENT_CLINIC_OR_DEPARTMENT_OTHER): Payer: Medicare Other | Admitting: Oncology

## 2014-08-22 VITALS — BP 124/63 | HR 64 | Temp 97.9°F | Resp 18 | Ht 67.75 in | Wt 144.7 lb

## 2014-08-22 DIAGNOSIS — N950 Postmenopausal bleeding: Secondary | ICD-10-CM

## 2014-08-22 DIAGNOSIS — C50912 Malignant neoplasm of unspecified site of left female breast: Secondary | ICD-10-CM

## 2014-08-22 DIAGNOSIS — C50219 Malignant neoplasm of upper-inner quadrant of unspecified female breast: Secondary | ICD-10-CM

## 2014-08-22 DIAGNOSIS — K2270 Barrett's esophagus without dysplasia: Secondary | ICD-10-CM | POA: Diagnosis not present

## 2014-08-22 DIAGNOSIS — D0512 Intraductal carcinoma in situ of left breast: Secondary | ICD-10-CM

## 2014-08-22 NOTE — Telephone Encounter (Signed)
Gave avs & calendar for September & June. Referral appointment for GYN 06/28 @ 1:00

## 2014-08-22 NOTE — Progress Notes (Signed)
[place]  [phone] [phone]     ID: Alison [name] OB: 12/24/1945  MR#: [id number]  KDX#:[id number]  PCP: Ival Bible, MD GYN:  Uvaldo Rising MD SU: Jackolyn Confer MD OTHER MD: Lance Morin MD, [name] MD  CHIEF COMPLAINT: Noninvasive left breast cancer CURRENT TREATMENT: observation.   BREAST CANCER HISTORY: From the original intake note:  Alison [name] had screening mammography 03/21/2013 suggesting a change in her left breast. Unilateral left diagnostic mammography 03/23/2013 showed a 3 mm cluster of calcifications in the left breast lower inner quadrant. There was a second cluster measuring 1.1 cm in the left breast upper inner quadrant. Stereotactic biopsy of the upper inner quadrant calcifications was attempted, but the calcifications could not be located could not be located stereotactically and the patient proceeded to left lumpectomy 04/17/2013. 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 The inferior margin was at 0.1 cm. (Dr. Constance Holster tomorrow took additional tissue from what the pathology report calls the medial margin, but apparently was the inferior margin, and this was clear).  Estrogen receptor was 100% positive. Progesterone receptor was 97% positive. Both showed strong staining intensity.  The patient proceeded to adjuvant radiation which was recently completed 06/13/2013.  Her subsequent history is as detailed below  INTERVAL HISTORY: Alison [name] returns today for follow up of her breast cancer, accompanied by her partner Alison [name].  After her last visit here she gave tamoxifen a try. Not long after starting the medication she started developing lower abdominal cramps and pain. She also developed some spotting. She brought this to the attention of Dr. Dion Saucier,. According to the patient performed a pelvic exam which was unremarkable. The patient also saw Dr. Olevia Perches, who feels the problem is more  intestinal then gynecologic and has set the patient up for a CT scan of the abdomen in June 27. The patient's last colonoscopy was 7 or 8 years ago and that also is being considered. Given all these problems, the patient has stopped her tamoxifen. That has not resolved the problems however.  REVIEW OF SYSTEMS: Alison [name] also has a history of chronic back and joint pain which is not more persistent or intense than before. She still having hot flashes. She has not noted any change in bowel habits and particular denies significant problems with constipation, diarrhea, tarry or black stools, or bloody stools. She has not had any further episodes of spotting since going off the tamoxifen. A detailed review of systems today was otherwise stable  PAST MEDICAL HISTORY: Past Medical History  Diagnosis Date  . GERD (gastroesophageal reflux disease)   . Internal hemorrhoids   . Esophagitis   . 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 H/O adenomatous polyp of colon   . Esophageal stricture   . Arthritis   . PONV (postoperative nausea and vomiting)   . Contact lens/glasses fitting     wears contacts or glasses  . Vertigo   . Radiation 05/16/13-06/13/13    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  . Hypertension   . Breast cancer 03/2013  . Hiatal hernia   . Adenomatous colon polyp   . Migraine headache     PAST SURGICAL HISTORY: Past Surgical History  Procedure Laterality Date  . Ovarian cyst removal    . Tonsillectomy    . Total hip arthroplasty Right 2005  . Nissen fundoplication  9767  . Upper gi endoscopy    . Colonoscopy    . Breast biopsy Left 04/17/2013  Procedure: BREAST BIOPSY WITH NEEDLE LOCALIZATION;  Surgeon: Odis Hollingshead, MD;  Location: [facility];  Service: General;  Laterality: Left;  . Breast lumpectomy with radioactive seed localization Left 07/12/2014    Procedure: LEFT BREAST LUMPECTOMY WITH RADIOACTIVE SEED LOCALIZATION;  Surgeon: Jackolyn Confer, MD;  Location: [facility];  Service: General;  Laterality: Left;    FAMILY HISTORY Family History  Problem Relation Age of Onset  . Heart attack Father   . Lung cancer Mother 58    smoker  . Breast cancer Maternal Aunt     half-aunt at 50   one of the patient's mother is 5 sisters (a maternal aunt) was diagnosed with breast cancer at the age of 3. There are no other breast cancers in the family to her knowledge, and no cases of ovarian cancer.  GYNECOLOGIC HISTORY:  Menarche age 22, the patient is GX P0. She went through the change of life more than 15 years ago. She did not take hormone replacement. 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, works as a Catering manager. They have no pets. The patient is not a church attender    ADVANCED DIRECTIVES: In place; Alison [name] is [employer] power of attorney   HEALTH MAINTENANCE: History  Substance Use Topics  . Smoking status: Former Smoker -- 5 years    Types: Cigarettes    Quit date: 08/10/1977  . Smokeless tobacco: Never Used     Comment: She smoked for 5 years in her early 10s.    . Alcohol Use: Yes     Colonoscopy: 2010/ [facility]  PAP: April 2014  Bone density: 2013; showed osteopenia  Lipid panel:  Allergies  Allergen Reactions  . Adhesive [Tape]   . Morphine Rash  . Sulfonamide Derivatives Rash    Current Outpatient Prescriptions  Medication Sig Dispense Refill  . Calcium Carb-Cholecalciferol (CALCIUM + D3 PO) Take by mouth.    . dicyclomine (BENTYL) 10 MG capsule Take 1 capsule (10 mg total) by mouth 2 (two) times daily. 60 capsule 1  . meclizine (ANTIVERT) 25 MG tablet Take 1 tablet (25 mg total) by mouth 3 (three) times daily as needed for dizziness. 90 tablet 1  . metoprolol tartrate (LOPRESSOR) 25 MG tablet Take 1 tablet (25 mg total) by mouth 2 (two) times daily. 180 tablet 3  . Milk Thistle 1000 MG  CAPS Take by mouth.    . Omega-3 Fatty Acids (FISH OIL) 1000 MG CAPS Take by mouth.    . pantoprazole (PROTONIX) 40 MG tablet Take 1 tablet (40 mg total) by mouth 2 (two) times daily. 180 tablet 3  . Probiotic Product (PROBIOTIC ACIDOPHILUS) CAPS Take by mouth.    . SUMAtriptan (IMITREX) 20 MG/ACT nasal spray Place 1 spray (20 mg total) into the nose every 2 (two) hours as needed for migraine. 8 Inhaler 11  . tamoxifen (NOLVADEX) 10 MG tablet Take 10 mg by mouth 2 (two) times daily.    . TURMERIC PO Take by mouth.    . vitamin B-12 (CYANOCOBALAMIN) 100 MCG tablet Take 100 mcg by mouth daily.     No current facility-administered medications for this visit.    OBJECTIVE: Middle-aged white woman who appears well Filed Vitals:   08/22/14 0953  BP: 124/63  Pulse: 64  Temp: 97.9 F (36.6 C)  Resp: 18  Body mass index is 22.16 kg/(m^2).    ECOG FS:1 - Symptomatic but completely ambulatory  Sclerae unicteric, EOMs intact Oropharynx clear, dentition in good repair No cervical or supraclavicular adenopathy Lungs no rales or rhonchi Heart regular rate and rhythm Abd soft, nontender, positive bowel sounds MSK no focal spinal tenderness, no upper extremity lymphedema Neuro: nonfocal, well oriented, appropriate affect Breasts: The right breast shows a 2 cm mass in the inferior aspect which I do not remember palpating previously. It is movable and nontender. It is not obviously cystic.. The left breast is status post lumpectomy and radiation. There is no evidence of local recurrence. The left axilla is benign.Marland Kitchen  LAB RESULTS:  CMP     Component Value Date/Time   NA 141 07/10/2014 1155   NA 142 05/16/2014 0959   K 3.8 07/10/2014 1155   K 4.0 05/16/2014 0959   CL 105 07/10/2014 1155   CO2 27 07/10/2014 1155   CO2 25 05/16/2014 0959   GLUCOSE 96 07/10/2014 1155   GLUCOSE 75 05/16/2014 0959   BUN 14 08/21/2014 1430   BUN 15.8 05/16/2014 0959   CREATININE 0.73 08/21/2014 1430    CREATININE 0.7 05/16/2014 0959   CREATININE 0.70 07/18/2012 0918   CALCIUM 9.0 07/10/2014 1155   CALCIUM 9.1 05/16/2014 0959   PROT 6.2* 07/10/2014 1155   PROT 6.1* 05/16/2014 0959   ALBUMIN 3.7 07/10/2014 1155   ALBUMIN 3.6 05/16/2014 0959   AST 18 07/10/2014 1155   AST 15 05/16/2014 0959   ALT 15 07/10/2014 1155   ALT 12 05/16/2014 0959   ALKPHOS 62 07/10/2014 1155   ALKPHOS 72 05/16/2014 0959   BILITOT 0.7 07/10/2014 1155   BILITOT 0.57 05/16/2014 0959   GFRNONAA >60 07/10/2014 1155   GFRNONAA 89 07/18/2012 0918   GFRAA >60 07/10/2014 1155   GFRAA >89 07/18/2012 0918    I No results found for: SPEP  Lab Results  Component Value Date   WBC 5.3 07/10/2014   NEUTROABS 3.8 07/10/2014   HGB 13.4 07/10/2014   HCT 39.9 07/10/2014   MCV 88.9 07/10/2014   PLT 256 07/10/2014      Chemistry      Component Value Date/Time   NA 141 07/10/2014 1155   NA 142 05/16/2014 0959   K 3.8 07/10/2014 1155   K 4.0 05/16/2014 0959   CL 105 07/10/2014 1155   CO2 27 07/10/2014 1155   CO2 25 05/16/2014 0959   BUN 14 08/21/2014 1430   BUN 15.8 05/16/2014 0959   CREATININE 0.73 08/21/2014 1430   CREATININE 0.7 05/16/2014 0959   CREATININE 0.70 07/18/2012 0918      Component Value Date/Time   CALCIUM 9.0 07/10/2014 1155   CALCIUM 9.1 05/16/2014 0959   ALKPHOS 62 07/10/2014 1155   ALKPHOS 72 05/16/2014 0959   AST 18 07/10/2014 1155   AST 15 05/16/2014 0959   ALT 15 07/10/2014 1155   ALT 12 05/16/2014 0959   BILITOT 0.7 07/10/2014 1155   BILITOT 0.57 05/16/2014 0959       No results found for: LABCA2  No components found for: LABCA125  No results for input(s): INR in the last 168 hours.  Urinalysis No results found for: COLORURINE  STUDIES: Review of the most recent mammogram 04/10/2014 shows the breast density to be category C. The right breast is not specifically described except to say that there are no significant masses seen in either breast.  ASSESSMENT: 69 y.o.  [place] Ser woman status post  left lumpectomy 04/17/2013 for ductal carcinoma in situ grade 2 measuring 2.5 cm, estrogen and progesterone receptor positive with negative though close margins  (1) status post adjuvant radiation completed 06/13/2013  (2) osteopenia  (3) genetics testing omitted as family history felt to be low risk.  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 First of all postmenopausal spotting is always a concern. It could be that tamoxifen has uncovered a uterine problem. Tamoxifen would not be expected to cause uterine cancer of course after only 2 months of use.  Aylanie does not have a gynecologist. I am referring her to Dr. Toney Rakes for further evaluation. In the meantime were setting her up for a transvaginal ultrasound.  I do not recall her right breast having such a well delineated mass in the inferior aspect. I am setting her up for a unilateral right breast mammogram with ultrasound at the discretion of mammography, in the next week or so, to further evaluate this problem.  As far as the abdominal discomfort is concerned she is already being evaluated by Dr. Olevia Perches, with a CT scan of the abdomen pending June 27. We will follow-up on those results.  For now we are stopping the tamoxifen. This was primarily a prophylactic rather than a treatment regimen and I am very comfortable not resuming it but in any case Colisha will return to see me mid September and we can discuss that further at that time.  She has a good understanding of this plan. She agrees with it. She knows to call for any problems that may develop before her next visit here.   Chauncey Cruel, MD   08/22/2014 11:01 AM

## 2014-08-31 ENCOUNTER — Encounter: Payer: Self-pay | Admitting: Internal Medicine

## 2014-09-03 ENCOUNTER — Ambulatory Visit (INDEPENDENT_AMBULATORY_CARE_PROVIDER_SITE_OTHER)
Admission: RE | Admit: 2014-09-03 | Discharge: 2014-09-03 | Disposition: A | Discharge status: Home / Self Care | Payer: Medicare Other | Source: Ambulatory Visit | Attending: Internal Medicine | Admitting: Internal Medicine

## 2014-09-03 DIAGNOSIS — R1030 Lower abdominal pain, unspecified: Secondary | ICD-10-CM | POA: Diagnosis not present

## 2014-09-03 MED ORDER — IOHEXOL 300 MG/ML  SOLN
100.0000 mL | Freq: Once | INTRAMUSCULAR | Status: AC | PRN
  Administered 2014-09-03: 100 mL via INTRAVENOUS

## 2014-09-04 ENCOUNTER — Ambulatory Visit: Payer: Self-pay | Admitting: Gynecology

## 2014-09-04 ENCOUNTER — Telehealth: Payer: Self-pay | Admitting: Internal Medicine

## 2014-09-04 ENCOUNTER — Other Ambulatory Visit: Payer: Self-pay | Admitting: Radiology

## 2014-09-04 NOTE — Telephone Encounter (Signed)
See results note. 

## 2014-09-05 ENCOUNTER — Other Ambulatory Visit: Payer: Self-pay | Admitting: Gynecology

## 2014-09-05 ENCOUNTER — Other Ambulatory Visit (HOSPITAL_COMMUNITY)
Admission: RE | Admit: 2014-09-05 | Discharge: 2014-09-05 | Disposition: A | Discharge status: Home / Self Care | Payer: Medicare Other | Source: Ambulatory Visit | Attending: Gynecology | Admitting: Gynecology

## 2014-09-05 ENCOUNTER — Encounter: Payer: Self-pay | Admitting: Gynecology

## 2014-09-05 ENCOUNTER — Ambulatory Visit (INDEPENDENT_AMBULATORY_CARE_PROVIDER_SITE_OTHER): Payer: Medicare Other | Admitting: Gynecology

## 2014-09-05 ENCOUNTER — Telehealth: Payer: Self-pay

## 2014-09-05 ENCOUNTER — Ambulatory Visit (INDEPENDENT_AMBULATORY_CARE_PROVIDER_SITE_OTHER): Payer: Medicare Other

## 2014-09-05 VITALS — BP 132/78 | Ht 68.0 in | Wt 148.0 lb

## 2014-09-05 DIAGNOSIS — N950 Postmenopausal bleeding: Secondary | ICD-10-CM

## 2014-09-05 DIAGNOSIS — N83202 Unspecified ovarian cyst, left side: Secondary | ICD-10-CM

## 2014-09-05 DIAGNOSIS — Z853 Personal history of malignant neoplasm of breast: Secondary | ICD-10-CM

## 2014-09-05 DIAGNOSIS — Z124 Encounter for screening for malignant neoplasm of cervix: Secondary | ICD-10-CM | POA: Diagnosis not present

## 2014-09-05 DIAGNOSIS — N8320 Unspecified ovarian cysts: Secondary | ICD-10-CM

## 2014-09-05 DIAGNOSIS — Z1151 Encounter for screening for human papillomavirus (HPV): Secondary | ICD-10-CM | POA: Diagnosis present

## 2014-09-05 DIAGNOSIS — N83201 Unspecified ovarian cyst, right side: Secondary | ICD-10-CM | POA: Insufficient documentation

## 2014-09-05 DIAGNOSIS — R140 Abdominal distension (gaseous): Secondary | ICD-10-CM

## 2014-09-05 DIAGNOSIS — R934 Abnormal findings on diagnostic imaging of urinary organs: Secondary | ICD-10-CM

## 2014-09-05 DIAGNOSIS — R9389 Abnormal findings on diagnostic imaging of other specified body structures: Secondary | ICD-10-CM

## 2014-09-05 NOTE — Patient Instructions (Signed)
Laparoscopically Assisted Vaginal Hysterectomy  A laparoscopically assisted vaginal hysterectomy (LAVH) is a surgical procedure to remove the uterus and cervix, and sometimes the ovaries and fallopian tubes. During an LAVH, some of the surgical removal is done through the vagina, and the rest is done through a few small surgical cuts (incisions) in the abdomen.  This procedure is usually considered in women when a vaginal hysterectomy is not an option. Your health care provider will discuss the risks and benefits of the different surgical techniques at your appointment. Generally, recovery time is faster and there are fewer complications after laparoscopic procedures than after open incisional procedures. LET [facility] CARE PROVIDER KNOW ABOUT:   Any allergies you have.  All medicines you are taking, including vitamins, herbs, eye drops, creams, and over-the-counter medicines.  Previous problems you or members of your family have had with the use of anesthetics.  Any blood disorders you have.  Previous surgeries you have had.  Medical conditions you have. RISKS AND COMPLICATIONS Generally, this is a safe procedure. However, as with any procedure, complications can occur. Possible complications include:  Allergies to medicines.  Difficulty breathing.  Bleeding.  Infection.  Damage to other structures near your uterus and cervix. BEFORE THE PROCEDURE  Ask your health care provider about changing or stopping your regular medicines.  Take certain medicines, such as a colon-emptying preparation, as directed.  Do not eat or drink anything for at least 8 hours before your surgery.  Stop smoking if you smoke. Stopping will improve your health after surgery.  Arrange for a ride home after surgery and for help at home during recovery. PROCEDURE   An IV tube will be put into one of your veins in order to give you fluids and medicines.  You will receive medicines to relax you and  medicines that make you sleep (general anesthetic).  You may have a flexible tube (catheter) put into your bladder to drain urine.  You may have a tube put through your nose or mouth that goes into your stomach (nasogastric tube). The nasogastric tube removes digestive fluids and prevents you from feeling nauseated and from vomiting.  Tight-fitting (compression) stockings will be placed on your legs to promote circulation.  Three to four small incisions will be made in your abdomen. An incision also will be made in your vagina. Probes and tools will be inserted into the small incisions. The uterus and cervix are removed (and possibly your ovaries and fallopian tubes) through your vagina as well as through the small incisions that were made in the abdomen.  Your vagina is then sewn back to normal. AFTER THE PROCEDURE  You may have a liquid diet temporarily. You will most likely return to, and tolerate, your usual diet the day after surgery.  You will be passing urine through a catheter. It will be removed the day after surgery.  Your temperature, breathing rate, heart rate, blood pressure, and oxygen level will be monitored regularly.  You will still wear compression stockings on your legs until you are able to move around.  You will use a special device or do breathing exercises to keep your lungs clear.  You will be encouraged to walk as soon as possible. Document Released: 02/12/2011 Document Revised: 10/26/2012 Document Reviewed: 09/08/2012 [facility] Patient Information 2015 [place], [place]. This information is not intended to replace advice given to you by your health care provider. Make sure you discuss any questions you have with your health care provider. Ovarian Cyst An  ovarian cyst is a fluid-filled sac that forms on an ovary. The ovaries are small organs that produce eggs in women. Various types of cysts can form on the ovaries. Most are not cancerous. Many do not cause problems,  and they often go away on their own. Some may cause symptoms and require treatment. Common types of ovarian cysts include:  Functional cysts--These cysts may occur every month during the menstrual cycle. This is normal. The cysts usually go away with the next menstrual cycle if the woman does not get pregnant. Usually, there are no symptoms with a functional cyst.  Endometrioma cysts--These cysts form from the tissue that lines the uterus. They are also called "chocolate cysts" because they become filled with blood that turns brown. This type of cyst can cause pain in the lower abdomen during intercourse and with your menstrual period.  Cystadenoma cysts--This type develops from the cells on the outside of the ovary. These cysts can get very big and cause lower abdomen pain and pain with intercourse. This type of cyst can twist on itself, cut off its blood supply, and cause severe pain. It can also easily rupture and cause a lot of pain.  Dermoid cysts--This type of cyst is sometimes found in both ovaries. These cysts may contain different kinds of body tissue, such as skin, teeth, hair, or cartilage. They usually do not cause symptoms unless they get very big.  Theca lutein cysts--These cysts occur when too much of a certain hormone (human chorionic gonadotropin) is produced and overstimulates the ovaries to produce an egg. This is most common after procedures used to assist with the conception of a baby (in vitro fertilization). CAUSES   Fertility drugs can cause a condition in which multiple large cysts are formed on the ovaries. This is called ovarian hyperstimulation syndrome.  A condition called polycystic ovary syndrome can cause hormonal imbalances that can lead to nonfunctional ovarian cysts. SIGNS AND SYMPTOMS  Many ovarian cysts do not cause symptoms. If symptoms are present, they may include:  Pelvic pain or pressure.  Pain in the lower abdomen.  Pain during sexual  intercourse.  Increasing girth (swelling) of the abdomen.  Abnormal menstrual periods.  Increasing pain with menstrual periods.  Stopping having menstrual periods without being pregnant. DIAGNOSIS  These cysts are commonly found during a routine or annual pelvic exam. Tests may be ordered to find out more about the cyst. These tests may include:  Ultrasound.  X-ray of the pelvis.  CT scan.  MRI.  Blood tests. TREATMENT  Many ovarian cysts go away on their own without treatment. Your health care provider may want to check your cyst regularly for 2-3 months to see if it changes. For women in menopause, it is particularly important to monitor a cyst closely because of the higher rate of ovarian cancer in menopausal women. When treatment is needed, it may include any of the following:  A procedure to drain the cyst (aspiration). This may be done using a long needle and ultrasound. It can also be done through a laparoscopic procedure. This involves using a thin, lighted tube with a tiny camera on the end (laparoscope) inserted through a small incision.  Surgery to remove the whole cyst. This may be done using laparoscopic surgery or an open surgery involving a larger incision in the lower abdomen.  Hormone treatment or birth control pills. These methods are sometimes used to help dissolve a cyst. HOME CARE INSTRUCTIONS   Only take over-the-counter or prescription  medicines as directed by your health care provider.  Follow up with your health care provider as directed.  Get regular pelvic exams and Pap tests. SEEK MEDICAL CARE IF:   Your periods are late, irregular, or painful, or they stop.  Your pelvic pain or abdominal pain does not go away.  Your abdomen becomes larger or swollen.  You have pressure on your bladder or trouble emptying your bladder completely.  You have pain during sexual intercourse.  You have feelings of fullness, pressure, or discomfort in your  stomach.  You lose weight for no apparent reason.  You feel generally ill.  You become constipated.  You lose your appetite.  You develop acne.  You have an increase in body and facial hair.  You are gaining weight, without changing your exercise and eating habits.  You think you are pregnant. SEEK IMMEDIATE MEDICAL CARE IF:   You have increasing abdominal pain.  You feel sick to your stomach (nauseous), and you throw up (vomit).  You develop a fever that comes on suddenly.  You have abdominal pain during a bowel movement.  Your menstrual periods become heavier than usual. MAKE SURE YOU:  Understand these instructions.  Will watch your condition.  Will get help right away if you are not doing well or get worse. Document Released: 02/23/2005 Document Revised: 02/28/2013 Document Reviewed: 10/31/2012 [facility] Patient Information 2015 [place], [place]. This information is not intended to replace advice given to you by your health care provider. Make sure you discuss any questions you have with your health care provider. CA-125 Tumor Marker CA 125 is a tumor marker that is used to help monitor the course of ovarian or endometrial cancer. PREPARATION FOR TEST No preparation is necessary. NORMAL FINDINGS Adults: 0-35 units/mL (0-35 kilounits)/L Ranges for normal findings may vary among different laboratories and hospitals. You should always check with your doctor after having lab work or other tests done to discuss the meaning of your test results and whether your values are considered within normal limits. MEANING OF TEST  Your caregiver will go over the test results with you and discuss the importance and meaning of your results, as well as treatment options and the need for additional tests if necessary. OBTAINING THE TEST RESULTS It is your responsibility to obtain your test results. Ask the lab or department performing the test when and how you will get your  results. Document Released: 03/17/2004 Document Revised: 05/18/2011 Document Reviewed: 02/01/2008 [facility] Patient Information 2015 [place], [place]. This information is not intended to replace advice given to you by your health care provider. Make sure you discuss any questions you have with your health care provider.

## 2014-09-05 NOTE — Progress Notes (Signed)
Patient is a 69 year old who was referred to our practice as a courtesy of her medical oncologist Dr. Gunnar Bulla Magrinat as a result of patient's postmenopausal bleeding. Review of her record indicated that as a result of abnormality noted on left breast diagnostic mammogram in January 2015 she had undergone stereotactic biopsy but the catheter occasions that up recently noted could not be located stereotactically and the patient subsequently underwent left lumpectomy in 04/27/2013. 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 (Dr. Constance Holster tomorrow took additional tissue from what the pathology report calls the medial margin, but apparently was the inferior margin, and this was clear). Estrogen receptor was 100% positive. Progesterone receptor was 97% positive. Patient completed adjuvant radiation therapy in April 2015. She was then prescribed tamoxifen but had to discontinue because of abdominal cramps and pain and begin to develop vaginal spotting. Patient recently had been referred to Dr. Maurene Capes gastroenterologist and a CT of the abdomen was ordered on June 27 of this year in which the pathologist reported the following:   IMPRESSION: 1. No acute findings within the abdomen or pelvis. 2. Left kidney cyst. 3. Lumbar spondylosis.  Patient reports many years ago she had laparotomy for bilateral ovarian cysts that were removed. She also stated that in her 56s she took hormone replacement therapy for only 2 years.  Exam today: Blood pressure 132/78  height 5 feet 8 inches tall weight 148 pounds Gen. appearance well-developed well-nourished female in no acute distress Abdomen: Several scar port sites from previous fundoplication that patient had several years ago soft nontender no rebound or guarding. Small Pfannenstiel incision scar noted Pelvic: Bartholin urethra Skene was within normal limits Vagina: No lesions or discharge  cervix no lesions or discharge Uterus: Normal size shape and consistency retroverted Right adnexal fullness was noted Left adnexa normal Rectal exam not done  Ultrasound today here in our office demonstrated the following: Uterus measures 6.6 x 4.7 x 3.5 cm with endometrial stripe is 7.4 mm. The uterus demonstrated multiple cystic areas and thickened. A right ovarian echo-free thinwall avascular cyst measuring 4.1 x 3.0 x 3.5 cm was noted. Also a small left ovarian complex thick wall a vascular cyst measuring 1.6 x 1.0 x 1.7 cm was noted and there was no free fluid in the cul-de-sac.  Of note before the bimanual exam with Pap smear was obtained. The patient was counseled for an endometrial biopsy. The cervix was cleansed with Betadine solution. The cervix due to the fact that it was stenotic required dilatation and effort to insert the sterile Pipelle to obtain tissue for histological evaluation.  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 Pap smear and endometrial biopsy done today. Patient's tumor were both estrogenic and progesterone receptor positive. Because of the bilateral ovarian cysts that were noted on ultrasound today and not seen on CT scan and ovarian cancer screening will be ordered today with the ROMA-1 test today. Will notify patient with the results of the Pap smear and endometrial biopsy and the blood tests. She is very anxious and would like to proceed with a hysterectomy instead of just removing both tubes and ovary which I totally agree. Literature information on LAVH BSO was provided. If the ROMA-1 Is elevated with  endometrial biopsy shows any signs of malignancy I explained to her that I would need to refer her to the GYN oncologist because she would  need a formal radical hysterectomy with staging procedure.

## 2014-09-05 NOTE — Telephone Encounter (Signed)
I called patient and discussed dates with her and scheduled her LAVH for 11/06/14 with Dr. Moshe Salisbury.  Discussed ins benefits and that nothing due to [facility] in advance. She has $250 copymt to facility.

## 2014-09-06 ENCOUNTER — Telehealth: Payer: Self-pay

## 2014-09-06 ENCOUNTER — Encounter: Payer: Self-pay | Admitting: Gynecology

## 2014-09-06 LAB — CYTOLOGY - PAP

## 2014-09-06 NOTE — Telephone Encounter (Signed)
LMOVM - Per Dr. [name], pathology results are "no cancer".  Pt to call clinic if she has any questions or needs addnl info.

## 2014-09-07 ENCOUNTER — Ambulatory Visit: Payer: Medicare Other | Admitting: Internal Medicine

## 2014-09-13 ENCOUNTER — Telehealth: Payer: Self-pay | Admitting: Internal Medicine

## 2014-09-13 NOTE — Telephone Encounter (Signed)
Left a message for patient to call back. 

## 2014-09-13 NOTE — Telephone Encounter (Signed)
CT scan does not  Replace colonoscopy  Because it is not likely to  Detect polyps. She had adenomas in 2001 and 2006. Last colon 2009. Recommend recall colonoscopy

## 2014-09-13 NOTE — Telephone Encounter (Signed)
Patient told the CT did not take place of colonoscopy. She states she had the CT on Monday. On Wednesday, she had a lot of tiny bumps on her body. Did not itch and was not red just raised. She did use a soap that she did not always use. She wanted to let Dr. Olevia Perches know since she had the CT contrast.

## 2014-09-14 NOTE — Telephone Encounter (Signed)
Patient notified of recommendations. 

## 2014-09-15 LAB — OVARIAN MALIGNANCY RISK-ROMA
CA125: 28 U/mL (ref <35)
HE4: 54 pM (ref <151)
ROMA POSTMENOPAUSAL: 1.82 (ref <2.77)
ROMA PREMENOPAUSAL: 0.91 (ref <1.31)

## 2014-09-17 ENCOUNTER — Telehealth: Payer: Self-pay

## 2014-09-17 NOTE — Telephone Encounter (Signed)
Left detailed message on patient's voice mail per [facility] access note.

## 2014-09-17 NOTE — Telephone Encounter (Signed)
Patient wanted to know if the cysts on her ovaries could cause discomfort from time to time. She said it is not bad and she does not even have to take anything for it she is just aware of it and wondered if that could be what it is.

## 2014-09-17 NOTE — Telephone Encounter (Signed)
Yes this is possible as a result of her cysts

## 2014-10-10 ENCOUNTER — Telehealth: Payer: Self-pay

## 2014-10-10 NOTE — Telephone Encounter (Signed)
I will need an updated typed schedule with all the recent changes. Thanks

## 2014-10-10 NOTE — Telephone Encounter (Signed)
Patient called in voice mail stating she needed to cancel her surgery for 11/06/14 as she will be out of town. I called her back and got her voice mail and told her I had taken care of cancelling her surgery and just to call me back when she is ready to re-schedule.

## 2014-10-25 ENCOUNTER — Ambulatory Visit: Payer: Medicare Other | Admitting: Gynecology

## 2014-11-02 ENCOUNTER — Telehealth: Payer: Self-pay | Admitting: Oncology

## 2014-11-02 ENCOUNTER — Telehealth: Payer: Self-pay | Admitting: Nurse Practitioner

## 2014-11-02 ENCOUNTER — Encounter: Payer: Self-pay | Admitting: Nurse Practitioner

## 2014-11-02 ENCOUNTER — Ambulatory Visit (HOSPITAL_BASED_OUTPATIENT_CLINIC_OR_DEPARTMENT_OTHER): Payer: Medicare Other | Admitting: Nurse Practitioner

## 2014-11-02 VITALS — BP 136/65 | HR 65 | Temp 97.7°F | Resp 18 | Ht 68.0 in | Wt 147.7 lb

## 2014-11-02 DIAGNOSIS — N83202 Unspecified ovarian cyst, left side: Secondary | ICD-10-CM

## 2014-11-02 DIAGNOSIS — D0512 Intraductal carcinoma in situ of left breast: Secondary | ICD-10-CM | POA: Diagnosis not present

## 2014-11-02 DIAGNOSIS — N8320 Unspecified ovarian cysts: Secondary | ICD-10-CM | POA: Diagnosis not present

## 2014-11-02 DIAGNOSIS — N83201 Unspecified ovarian cyst, right side: Secondary | ICD-10-CM

## 2014-11-02 DIAGNOSIS — C50219 Malignant neoplasm of upper-inner quadrant of unspecified female breast: Secondary | ICD-10-CM

## 2014-11-02 NOTE — Telephone Encounter (Signed)
Appointments made and avs printed for patient °

## 2014-11-02 NOTE — Progress Notes (Signed)
[facility]  [phone] [phone]) [phone]     ID: [name] OB: 09-Jul-1945  MR#: [id number]  UDJ#:[id number]  PCP: Ival Bible, MD GYN:  Uvaldo Rising MD SU: Jackolyn Confer MD OTHER MD: Lance Morin MD, [name] MD  CHIEF COMPLAINT: Noninvasive left breast cancer CURRENT TREATMENT: observation.   BREAST CANCER HISTORY: From the original intake note:  Akaysha had screening mammography 03/21/2013 suggesting a change in her left breast. Unilateral left diagnostic mammography 03/23/2013 showed a 3 mm cluster of calcifications in the left breast lower inner quadrant. There was a second cluster measuring 1.1 cm in the left breast upper inner quadrant. Stereotactic biopsy of the upper inner quadrant calcifications was attempted, but the calcifications could not be located could not be located stereotactically and the patient proceeded to left lumpectomy 04/17/2013. 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 The inferior margin was at 0.1 cm. (Dr. Constance Holster tomorrow took additional tissue from what the pathology report calls the medial margin, but apparently was the inferior margin, and this was clear).  Estrogen receptor was 100% positive. Progesterone receptor was 97% positive. Both showed strong staining intensity.  The patient proceeded to adjuvant radiation which was recently completed 06/13/2013.  Her subsequent history is as detailed below  INTERVAL HISTORY: Keigan returns today for follow up of her breast cancer, accompanied by her partner [name].  The interval history is remarkable for a transvaginal ultrasound that revealed bilateral ovarian cysts and a uterine polyp. She stopped her tamoxifen at the last visit, and was scheduled to have her uterus and ovaries removed by Dr. Toney Rakes, but she has placed this on hold. She is here to discuss the merits and/or necessity of this surgery and antiestrogen  therapy.  REVIEW OF SYSTEMS: Since she has stopped the tamoxifen she has had no more spotting. The discomfort that she previously experienced to lower abdomen occurs less frequently. She denies fevers, chills, nausea, vomiting, or changes in bowel or bladder habits. She has hot flashes occasionally. She has a history of joint and back pain that is no more persistent or intense than before. She denies shortness of breath, chest pain, cough, or palpitations. A detailed review of systems is otherwise stable.  PAST MEDICAL HISTORY: Past Medical History  Diagnosis Date  . GERD (gastroesophageal reflux disease)   . Internal hemorrhoids   . Esophagitis   . 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 H/O adenomatous polyp of colon   . Esophageal stricture   . Arthritis   . PONV (postoperative nausea and vomiting)   . Contact lens/glasses fitting     wears contacts or glasses  . Vertigo   . Radiation 05/16/13-06/13/13    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  . Hypertension   . Hiatal hernia   . Adenomatous colon polyp   . Migraine headache   . Breast cancer 03/2013    Left w/lumpectomy    PAST SURGICAL HISTORY: Past Surgical History  Procedure Laterality Date  . Ovarian cyst removal    . Tonsillectomy    . Total hip arthroplasty Right 2005  . Nissen fundoplication  5885  . Upper gi endoscopy    . Colonoscopy    . Breast biopsy Left 04/17/2013    Procedure: BREAST BIOPSY WITH NEEDLE LOCALIZATION;  Surgeon: Odis Hollingshead, MD;  Location: [facility];  Service: General;  Laterality: Left;  . Breast lumpectomy with radioactive seed localization Left 07/12/2014    Procedure:  LEFT BREAST LUMPECTOMY WITH RADIOACTIVE SEED LOCALIZATION;  Surgeon: Jackolyn Confer, MD;  Location: [facility];  Service: General;  Laterality: Left;    FAMILY HISTORY Family History  Problem Relation Age of Onset  . Heart attack Father   . Lung cancer Mother 54    smoker  . Breast cancer Maternal Aunt      half-aunt at 40   one of the patient's mother is 5 sisters (a maternal aunt) was diagnosed with breast cancer at the age of 6. There are no other breast cancers in the family to her knowledge, and no cases of ovarian cancer.  GYNECOLOGIC HISTORY:  Menarche age 62, the patient is GX P0. She went through the change of life more than 15 years ago. She did not take hormone replacement. 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, works as a Catering manager. They have no pets. The patient is not a church attender    ADVANCED DIRECTIVES: In place; [name] is [employer] power of attorney   HEALTH MAINTENANCE: Social History  Substance Use Topics  . Smoking status: Former Smoker -- 5 years    Types: Cigarettes    Quit date: 08/10/1977  . Smokeless tobacco: Never Used     Comment: She smoked for 5 years in her early 61s.    . Alcohol Use: 0.0 oz/week    0 Standard drinks or equivalent per week     Colonoscopy: 2010/ [facility]  PAP: April 2014  Bone density: 2013; showed osteopenia  Lipid panel:  Allergies  Allergen Reactions  . Adhesive [Tape]   . Morphine Rash  . Sulfonamide Derivatives Rash    Current Outpatient Prescriptions  Medication Sig Dispense Refill  . omeprazole (PRILOSEC) 20 MG capsule Take 20 mg by mouth 2 (two) times daily.    . Calcium Carb-Cholecalciferol (CALCIUM + D3 PO) Take by mouth.    . meclizine (ANTIVERT) 25 MG tablet Take 1 tablet (25 mg total) by mouth 3 (three) times daily as needed for dizziness. 90 tablet 1  . metoprolol tartrate (LOPRESSOR) 25 MG tablet Take 1 tablet (25 mg total) by mouth 2 (two) times daily. 180 tablet 3  . Milk Thistle 1000 MG CAPS Take by mouth.    . Omega-3 Fatty Acids (FISH OIL) 1000 MG CAPS Take by mouth.    . Probiotic Product (PROBIOTIC ACIDOPHILUS) CAPS Take by mouth.    . SUMAtriptan (IMITREX) 20  MG/ACT nasal spray Place 1 spray (20 mg total) into the nose every 2 (two) hours as needed for migraine. 8 Inhaler 11  . TURMERIC PO Take by mouth.     No current facility-administered medications for this visit.    OBJECTIVE: Middle-aged white woman who appears well Filed Vitals:   11/02/14 1408  BP: 136/65  Pulse: 65  Temp: 97.7 F (36.5 C)  Resp: 18     Body mass index is 22.46 kg/(m^2).    ECOG FS:1 - Symptomatic but completely ambulatory  Skin: warm, dry  HEENT: sclerae anicteric, conjunctivae pink, oropharynx clear. No thrush or mucositis.  Lymph Nodes: No cervical or supraclavicular lymphadenopathy  Lungs: clear to auscultation bilaterally, no rales, wheezes, or rhonci  Heart: regular rate and rhythm  Abdomen: round, soft, non tender, positive bowel sounds  Musculoskeletal: No focal spinal tenderness, no peripheral edema  Neuro: non focal, well oriented, positive affect  Breasts: deferred  LAB RESULTS:  CMP     Component Value Date/Time   NA 141 07/10/2014 1155   NA 142 05/16/2014 0959   K 3.8 07/10/2014 1155   K 4.0 05/16/2014 0959   CL 105 07/10/2014 1155   CO2 27 07/10/2014 1155   CO2 25 05/16/2014 0959   GLUCOSE 96 07/10/2014 1155   GLUCOSE 75 05/16/2014 0959   BUN 14 08/21/2014 1430   BUN 15.8 05/16/2014 0959   CREATININE 0.73 08/21/2014 1430   CREATININE 0.7 05/16/2014 0959   CREATININE 0.70 07/18/2012 0918   CALCIUM 9.0 07/10/2014 1155   CALCIUM 9.1 05/16/2014 0959   PROT 6.2* 07/10/2014 1155   PROT 6.1* 05/16/2014 0959   ALBUMIN 3.7 07/10/2014 1155   ALBUMIN 3.6 05/16/2014 0959   AST 18 07/10/2014 1155   AST 15 05/16/2014 0959   ALT 15 07/10/2014 1155   ALT 12 05/16/2014 0959   ALKPHOS 62 07/10/2014 1155   ALKPHOS 72 05/16/2014 0959   BILITOT 0.7 07/10/2014 1155   BILITOT 0.57 05/16/2014 0959   GFRNONAA >60 07/10/2014 1155   GFRNONAA 89 07/18/2012 0918   GFRAA >60 07/10/2014 1155   GFRAA >89 07/18/2012 0918    I No results found for:  SPEP  Lab Results  Component Value Date   WBC 5.3 07/10/2014   NEUTROABS 3.8 07/10/2014   HGB 13.4 07/10/2014   HCT 39.9 07/10/2014   MCV 88.9 07/10/2014   PLT 256 07/10/2014      Chemistry      Component Value Date/Time   NA 141 07/10/2014 1155   NA 142 05/16/2014 0959   K 3.8 07/10/2014 1155   K 4.0 05/16/2014 0959   CL 105 07/10/2014 1155   CO2 27 07/10/2014 1155   CO2 25 05/16/2014 0959   BUN 14 08/21/2014 1430   BUN 15.8 05/16/2014 0959   CREATININE 0.73 08/21/2014 1430   CREATININE 0.7 05/16/2014 0959   CREATININE 0.70 07/18/2012 0918      Component Value Date/Time   CALCIUM 9.0 07/10/2014 1155   CALCIUM 9.1 05/16/2014 0959   ALKPHOS 62 07/10/2014 1155   ALKPHOS 72 05/16/2014 0959   AST 18 07/10/2014 1155   AST 15 05/16/2014 0959   ALT 15 07/10/2014 1155   ALT 12 05/16/2014 0959   BILITOT 0.7 07/10/2014 1155   BILITOT 0.57 05/16/2014 0959       No results found for: LABCA2  No components found for: LABCA125  No results for input(s): INR in the last 168 hours.  Urinalysis No results found for: COLORURINE  STUDIES: Pathology results of the ultrasound guided biopsy of the 1.8cm mass in the right breast at 7 o'clock indicated benign stromal hyalinization  ASSESSMENT: 69 y.o. 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  (4)  Opted against antiestrogen therapy   (5) biopsy of suspicious area in the left breast February 2016 showed atypical ductal hyperplasia  PLAN: Dr. [name] and I spent approximately 25 minutes discussing Ezzie's situation with her in detail. She is effectively cured from her non invasive breast cancer through surgery and radiation. The cells  did not make it outside of the duct, and therefore post no further risk of spreading to other parts of  the body. Any antiestrogen therapy she participates in then would not be considered treatment, but as a preventative measure for other breast cancers. It is reasonable for her to have reviewed the potential side effects of an aromatase inhibitor, and decide to decline this therapy.   As far as her uterine polyp and bilateral ovarian cysts are concerned, they may have been caused in part or in full by tamoxifen. If this were the case, stopping the drug might allow for the resolution of these abnormalities. Given the fact that the patient is hesitant to proceed with multiple surgeries in 1 year because of her age and the effect of anesthesia on the brain, we suggest she continue holding on the idea of having these organs removed, and simply repeat a transvaginal ultrasound this fall. If the abnormalities have improved or resolved, then she can cancel the surgery altogether. If the cysts or polyps have grown in size or in number, it would be best to move forward with removal.   Jeanene will have a transvaginal ultrasound performed in October and discuss the results with her GYN, Dr. Toney Rakes. I have made a follow up visit with Dr. [name] in December, should she move forward with surgery at that time. If not, she has been instructed to call and move this appointment to her routine 6 month follow up date in February. She understands and agrees with this plan. She has been encouraged to call with any issues that might arise before her next visit here.  Laurie Panda, NP   11/02/2014 3:19 PM

## 2014-11-05 ENCOUNTER — Telehealth: Payer: Self-pay

## 2014-11-05 ENCOUNTER — Other Ambulatory Visit: Payer: Self-pay | Admitting: Gynecology

## 2014-11-05 DIAGNOSIS — N950 Postmenopausal bleeding: Secondary | ICD-10-CM

## 2014-11-05 DIAGNOSIS — N83201 Unspecified ovarian cyst, right side: Secondary | ICD-10-CM

## 2014-11-05 DIAGNOSIS — N83202 Unspecified ovarian cyst, left side: Secondary | ICD-10-CM

## 2014-11-05 NOTE — Telephone Encounter (Signed)
Yes I will be fine. November

## 2014-11-05 NOTE — Telephone Encounter (Signed)
Patient called the office to schedule appointment stating her oncologist wanted her to have it.  Upon review of the NP note it was confusing because it stated that patient did not have GYn and would be referred for u/s.  I staffed the NP that dictated the note for clarification and to make sure she realized patient has already been seen by Dr. Moshe Salisbury in June as new patient on their referral.  She wrote back:  "Hey. I think you've caught me in the middle of my dictation! I have not been able to update that progress note from the last visit, but I will do so shortly. She discussed the merits of surgery with Dr. [name], and she would like to wait for a repeat ultrasound later this fall, like October/November. She does not want to "go under the knife" unless it is necessary. If things have improved or resolved we can hold off on surgery and simply perform ultrasounds every 6 months or so. If there are worsening symptoms or if her cysts enlarge then she will proceed to surgery. "  Dr. JF-Since patient cancelled surgery do you want to follow this recommendation and [place] schedule her a follow up u/s for Oct/Nov.?

## 2014-11-05 NOTE — Telephone Encounter (Signed)
Us scheduled

## 2014-11-06 ENCOUNTER — Ambulatory Visit: Admit: 2014-11-06 | Payer: Self-pay | Admitting: Gynecology

## 2014-11-06 SURGERY — HYSTERECTOMY, VAGINAL, LAPAROSCOPY-ASSISTED
Anesthesia: General

## 2014-11-21 ENCOUNTER — Ambulatory Visit: Payer: Medicare Other | Admitting: Oncology

## 2014-11-28 ENCOUNTER — Encounter: Payer: Self-pay | Admitting: Oncology

## 2014-12-19 ENCOUNTER — Ambulatory Visit (INDEPENDENT_AMBULATORY_CARE_PROVIDER_SITE_OTHER): Payer: Medicare Other

## 2014-12-19 ENCOUNTER — Ambulatory Visit (INDEPENDENT_AMBULATORY_CARE_PROVIDER_SITE_OTHER): Payer: Medicare Other | Admitting: Gynecology

## 2014-12-19 ENCOUNTER — Encounter: Payer: Self-pay | Admitting: Gynecology

## 2014-12-19 ENCOUNTER — Other Ambulatory Visit: Payer: Self-pay | Admitting: Gynecology

## 2014-12-19 VITALS — BP 128/80 | Ht 68.0 in | Wt 147.0 lb

## 2014-12-19 DIAGNOSIS — N950 Postmenopausal bleeding: Secondary | ICD-10-CM | POA: Diagnosis not present

## 2014-12-19 DIAGNOSIS — Z853 Personal history of malignant neoplasm of breast: Secondary | ICD-10-CM

## 2014-12-19 DIAGNOSIS — N83201 Unspecified ovarian cyst, right side: Secondary | ICD-10-CM

## 2014-12-19 DIAGNOSIS — N83202 Unspecified ovarian cyst, left side: Secondary | ICD-10-CM | POA: Diagnosis not present

## 2014-12-19 NOTE — Progress Notes (Signed)
Patient is a 69 year old with history of breast cancer that presented to the office to follow-up on right ovarian cyst. Her history from June 29 visit was as follows:  "Patient is a 69 year old who was referred to our practice as a courtesy of her medical oncologist Dr. Gunnar Bulla [name] as a result of patient's postmenopausal bleeding. Review of her record indicated that as a result of abnormality noted on left breast diagnostic mammogram in January 2015 she had undergone stereotactic biopsy but the catheter occasions that up recently noted could not be located stereotactically and the patient subsequently underwent left lumpectomy in 04/27/2013. 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 (Dr. Constance [name] tomorrow took additional tissue from what the pathology report calls the medial margin, but apparently was the inferior margin, and this was clear). Estrogen receptor was 100% positive. Progesterone receptor was 97% positive. Patient completed adjuvant radiation therapy in April 2015. She was then prescribed tamoxifen but had to discontinue because of abdominal cramps and pain and begin to develop vaginal spotting. Patient recently had been referred to Dr. Maurene [name] gastroenterologist and a CT of the abdomen was ordered on June 27 of this year in which the pathologist reported the following:   IMPRESSION: 1. No acute findings within the abdomen or pelvis. 2. Left kidney cyst. 3. Lumbar spondylosis.  Patient reports many years ago she had laparotomy for bilateral ovarian cysts that were removed. She also stated that in her 11s she took hormone replacement therapy for only 2 years."  On that last office visit patient had an ultrasound and endometrial biopsy with the following noted:  Uterus measures 6.6 x 4.7 x 3.5 cm with endometrial stripe is 7.4 mm. The uterus demonstrated multiple cystic areas and thickened. A right ovarian  echo-free thinwall avascular cyst measuring 4.1 x 3.0 x 3.5 cm was noted. Also a small left ovarian complex thick wall a vascular cyst measuring 1.6 x 1.0 x 1.7 cm was noted and there was no free fluid in the cul-de-sac.  Pathology report: FRAGMENTS OF ENDOMETRIAL TYPE POLYP. - BACKGROUND SECRETORY ENDOMETRIUM WITH BREAKDOWN. - NO HYPERPLASIA OR MALIGNANCY   ROMA-1 test was ordered last office visit and it was in the normal range. Patient was initially scheduled to undergo laparoscopic-assisted vaginal hysterectomy with bilateral salpingo-oophorectomy and canceled her surgery. Patient now has been off the tamoxifen in 6 months reports no vaginal bleeding and is here for follow-up ultrasound.  Ultrasound today: Uterus measures 6.1 x 4.6 x 2.5 cm within meter stripe 0.5 mm. Uterus was noted to have multiple cortical cyst right ovary rim of tissue seen with thinwall echo-free cyst measuring 43 x 32 x 38 mm average size 38 mm and a 12 x 9 avascular cyst noted. Arterial blood flow was seen in the right ovary. Left ovary follicle cyst measuring 10 x 5 mm was noted. No fluid in the cul-de-sac.  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 Patient with no further vaginal bleeding. Endometrial stripe thin and 0.5 mm. Persistent right ovarian cyst. Patient would now like to proceed with laparoscopic bilateral salpingo-oophorectomy and not hysterectomy. This appears to be a reasonable approach since her endometrial stripe is  very thin she's off the tamoxifen and is having no vaginal bleeding. She is working on this will minimize her being out of work for 6 weeks and only 2-3 after  laparoscopic bilateral salpingo-oophorectomy. She would like to schedule it for some time in late November early December. I provided her with literature information on laparoscopic-assisted vaginal hysterectomy as well as on ovarian cyst. We are going to check a CA 125 today to make sure that has remained stable from previous study a few months ago.

## 2014-12-19 NOTE — Patient Instructions (Signed)
Ovarian Cyst An ovarian cyst is a fluid-filled sac that forms on an ovary. The ovaries are small organs that produce eggs in women. Various types of cysts can form on the ovaries. Most are not cancerous. Many do not cause problems, and they often go away on their own. Some may cause symptoms and require treatment. Common types of ovarian cysts include:  Functional cysts--These cysts may occur every month during the menstrual cycle. This is normal. The cysts usually go away with the next menstrual cycle if the woman does not get pregnant. Usually, there are no symptoms with a functional cyst.  Endometrioma cysts--These cysts form from the tissue that lines the uterus. They are also called "chocolate cysts" because they become filled with blood that turns brown. This type of cyst can cause pain in the lower abdomen during intercourse and with your menstrual period.  Cystadenoma cysts--This type develops from the cells on the outside of the ovary. These cysts can get very big and cause lower abdomen pain and pain with intercourse. This type of cyst can twist on itself, cut off its blood supply, and cause severe pain. It can also easily rupture and cause a lot of pain.  Dermoid cysts--This type of cyst is sometimes found in both ovaries. These cysts may contain different kinds of body tissue, such as skin, teeth, hair, or cartilage. They usually do not cause symptoms unless they get very big.  Theca lutein cysts--These cysts occur when too much of a certain hormone (human chorionic gonadotropin) is produced and overstimulates the ovaries to produce an egg. This is most common after procedures used to assist with the conception of a baby (in vitro fertilization). CAUSES   Fertility drugs can cause a condition in which multiple large cysts are formed on the ovaries. This is called ovarian hyperstimulation syndrome.  A condition called polycystic ovary syndrome can cause hormonal imbalances that can lead to  nonfunctional ovarian cysts. SIGNS AND SYMPTOMS  Many ovarian cysts do not cause symptoms. If symptoms are present, they may include:  Pelvic pain or pressure.  Pain in the lower abdomen.  Pain during sexual intercourse.  Increasing girth (swelling) of the abdomen.  Abnormal menstrual periods.  Increasing pain with menstrual periods.  Stopping having menstrual periods without being pregnant. DIAGNOSIS  These cysts are commonly found during a routine or annual pelvic exam. Tests may be ordered to find out more about the cyst. These tests may include:  Ultrasound.  X-ray of the pelvis.  CT scan.  MRI.  Blood tests. TREATMENT  Many ovarian cysts go away on their own without treatment. Your health care provider may want to check your cyst regularly for 2-3 months to see if it changes. For women in menopause, it is particularly important to monitor a cyst closely because of the higher rate of ovarian cancer in menopausal women. When treatment is needed, it may include any of the following:  A procedure to drain the cyst (aspiration). This may be done using a long needle and ultrasound. It can also be done through a laparoscopic procedure. This involves using a thin, lighted tube with a tiny camera on the end (laparoscope) inserted through a small incision.  Surgery to remove the whole cyst. This may be done using laparoscopic surgery or an open surgery involving a larger incision in the lower abdomen.  Hormone treatment or birth control pills. These methods are sometimes used to help dissolve a cyst. HOME CARE INSTRUCTIONS   Only take over-the-counter  or prescription medicines as directed by your health care provider.  Follow up with your health care provider as directed.  Get regular pelvic exams and Pap tests. SEEK MEDICAL CARE IF:   Your periods are late, irregular, or painful, or they stop.  Your pelvic pain or abdominal pain does not go away.  Your abdomen becomes  larger or swollen.  You have pressure on your bladder or trouble emptying your bladder completely.  You have pain during sexual intercourse.  You have feelings of fullness, pressure, or discomfort in your stomach.  You lose weight for no apparent reason.  You feel generally ill.  You become constipated.  You lose your appetite.  You develop acne.  You have an increase in body and facial hair.  You are gaining weight, without changing your exercise and eating habits.  You think you are pregnant. SEEK IMMEDIATE MEDICAL CARE IF:   You have increasing abdominal pain.  You feel sick to your stomach (nauseous), and you throw up (vomit).  You develop a fever that comes on suddenly.  You have abdominal pain during a bowel movement.  Your menstrual periods become heavier than usual. MAKE SURE YOU:  Understand these instructions.  Will watch your condition.  Will get help right away if you are not doing well or get worse.   This information is not intended to replace advice given to you by your health care provider. Make sure you discuss any questions you have with your health care provider.   Document Released: 02/23/2005 Document Revised: 02/28/2013 Document Reviewed: 10/31/2012 Elsevier Interactive Patient Education 2016 [place]. Laparoscopically Assisted Vaginal Hysterectomy A laparoscopically assisted vaginal hysterectomy (LAVH) is a surgical procedure to remove the uterus and cervix, and sometimes the ovaries and fallopian tubes. During an LAVH, some of the surgical removal is done through the vagina, and the rest is done through a few small surgical cuts (incisions) in the abdomen.  This procedure is usually considered in women when a vaginal hysterectomy is not an option. Your health care provider will discuss the risks and benefits of the different surgical techniques at your appointment. Generally, recovery time is faster and there are fewer complications after  laparoscopic procedures than after open incisional procedures. LET [facility] CARE PROVIDER KNOW ABOUT:   Any allergies you have.  All medicines you are taking, including vitamins, herbs, eye drops, creams, and over-the-counter medicines.  Previous problems you or members of your family have had with the use of anesthetics.  Any blood disorders you have.  Previous surgeries you have had.  Medical conditions you have. RISKS AND COMPLICATIONS Generally, this is a safe procedure. However, as with any procedure, complications can occur. Possible complications include:  Allergies to medicines.  Difficulty breathing.  Bleeding.  Infection.  Damage to other structures near your uterus and cervix. BEFORE THE PROCEDURE  Ask your health care provider about changing or stopping your regular medicines.  Take certain medicines, such as a colon-emptying preparation, as directed.  Do not eat or drink anything for at least 8 hours before your surgery.  Stop smoking if you smoke. Stopping will improve your health after surgery.  Arrange for a ride home after surgery and for help at home during recovery. PROCEDURE   An IV tube will be put into one of your veins in order to give you fluids and medicines.  You will receive medicines to relax you and medicines that make you sleep (general anesthetic).  You may have a flexible  tube (catheter) put into your bladder to drain urine.  You may have a tube put through your nose or mouth that goes into your stomach (nasogastric tube). The nasogastric tube removes digestive fluids and prevents you from feeling nauseated and from vomiting.  Tight-fitting (compression) stockings will be placed on your legs to promote circulation.  Three to four small incisions will be made in your abdomen. An incision also will be made in your vagina. Probes and tools will be inserted into the small incisions. The uterus and cervix are removed (and possibly your  ovaries and fallopian tubes) through your vagina as well as through the small incisions that were made in the abdomen.  Your vagina is then sewn back to normal. AFTER THE PROCEDURE  You may have a liquid diet temporarily. You will most likely return to, and tolerate, your usual diet the day after surgery.  You will be passing urine through a catheter. It will be removed the day after surgery.  Your temperature, breathing rate, heart rate, blood pressure, and oxygen level will be monitored regularly.  You will still wear compression stockings on your legs until you are able to move around.  You will use a special device or do breathing exercises to keep your lungs clear.  You will be encouraged to walk as soon as possible.   This information is not intended to replace advice given to you by your health care provider. Make sure you discuss any questions you have with your health care provider.   Document Released: 02/12/2011 Document Revised: 03/16/2014 Document Reviewed: 09/08/2012 Elsevier Interactive Patient Education 2016 [employer] CA-125 Tumor Marker Test WHY AM I HAVING THIS TEST? This test is used to check the level of cancer antigen 125 (CA-125) in your blood. The CA-125 tumor marker test can be helpful in detecting ovarian cancer. The test is only performed if you are considered at high risk for ovarian cancer. Your health care provider may recommend this test if:  You have a strong family history of ovarian cancer.  You have a breast cancer antigen (BRCA) genetic defect. If you have already been diagnosed with ovarian cancer, your health care provider may use this test to help identify the extent of the disease and to monitor your response to treatment. WHAT KIND OF SAMPLE IS TAKEN? A blood sample is required for this test. It is usually collected by inserting a needle into a vein. HOW DO I PREPARE FOR THE TEST? There is no preparation required for this test. WHAT ARE  THE REFERENCE RANGES? Reference ranges are considered healthy ranges established after testing a large group of healthy people. Reference ranges may vary among different people, labs, and hospitals. It is your responsibility to obtain your test results. Ask the lab or department performing the test when and how you will get your results. The reference range for this test is 0-35 units/mL or less than 35 kunits/L (SI units). WHAT DO THE RESULTS MEAN? Increased levels of CA-125 may indicate:  Certain types of cancer, including:  Ovarian cancer.  Pancreatic cancer.  Colon cancer.  Lung cancer.  Breast cancer.  Lymphoma.  Noncancerous (benign) disorders, including:  Cirrhosis.  Pregnancy.  Endometriosis.  Pancreatitis.  Pelvic inflammatory disease (PID). Talk with your health care provider to discuss your results, treatment options, and if necessary, the need for more tests. Talk with your health care provider if you have any questions about your results.   This information is not intended to replace advice  given to you by your health care provider. Make sure you discuss any questions you have with your health care provider.   Document Released: 03/17/2004 Document Revised: 03/16/2014 Document Reviewed: 07/13/2013 Elsevier Interactive Patient Education [employer].

## 2014-12-20 LAB — CA 125: CA 125: 16 U/mL (ref <35)

## 2014-12-28 ENCOUNTER — Telehealth: Payer: Self-pay

## 2014-12-28 NOTE — Telephone Encounter (Signed)
I called patient to talk with her about scheduling out patient surgery. Patient said she has considered Dr. Moshe Salisbury told her she will need 3 weeks sedentary activity and also needs PCP clearance for surgery. Patient said she would like to schedule for the first week in January. I do not have January schedule yet but told her I will call her as soon I get it to schedule.

## 2015-01-23 ENCOUNTER — Telehealth: Payer: Self-pay

## 2015-01-23 NOTE — Telephone Encounter (Signed)
Patient had asked me to call her with January schedule. She had hoped to schedule her surgery the first week in Jan. I called and left message i have sched now but that Dr. Moshe Salisbury is out the first week of Jan. I asked her to call me and let me know if she would like to schedule Jan 10.

## 2015-02-07 ENCOUNTER — Telehealth: Payer: Self-pay

## 2015-02-07 NOTE — Telephone Encounter (Signed)
Patient called back and was informed of appt date/time.

## 2015-02-07 NOTE — Telephone Encounter (Addendum)
Patient said in voice mail that Dr. Moshe Salisbury wanted her to get medical clearance for surgery scheduled 03/19/15 and she needed some clarification. I called her back and asked for PCP and told her I am happy to assist her with arranging this. I called her PCP, Dr.Robyn Zanard's office ([phone])and was told patient will need to schedule appt. I scheduled her for Dec 9 at 8:30am.  I left message for patient to call me back.

## 2015-02-20 ENCOUNTER — Telehealth: Payer: Self-pay | Admitting: Oncology

## 2015-02-20 ENCOUNTER — Ambulatory Visit (HOSPITAL_BASED_OUTPATIENT_CLINIC_OR_DEPARTMENT_OTHER): Payer: Medicare Other | Admitting: Oncology

## 2015-02-20 VITALS — BP 127/80 | HR 79 | Temp 98.6°F | Resp 18 | Ht 68.0 in | Wt 148.2 lb

## 2015-02-20 DIAGNOSIS — D0512 Intraductal carcinoma in situ of left breast: Secondary | ICD-10-CM

## 2015-02-20 DIAGNOSIS — C50312 Malignant neoplasm of lower-inner quadrant of left female breast: Secondary | ICD-10-CM

## 2015-02-20 NOTE — Progress Notes (Signed)
[place]  [phone] [phone]) [phone]     ID: Alison [name] OB: 02/15/1946  MR#: [id number]  [id number]  PCP: Ival Bible, MD GYN:  Uvaldo Rising MD SU: Jackolyn Confer MD OTHER MD: Lance Morin MD, [name] MD  CHIEF COMPLAINT: Noninvasive left breast cancer CURRENT TREATMENT: observation.   BREAST CANCER HISTORY: From the original intake note:  [name] had screening mammography 03/21/2013 suggesting a change in her left breast. Unilateral left diagnostic mammography 03/23/2013 showed a 3 mm cluster of calcifications in the left breast lower inner quadrant. There was a second cluster measuring 1.1 cm in the left breast upper inner quadrant. Stereotactic biopsy of the upper inner quadrant calcifications was attempted, but the calcifications could not be located could not be located stereotactically and the patient proceeded to left lumpectomy 04/17/2013. 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 The inferior margin was at 0.1 cm. (Dr. Constance Holster tomorrow took additional tissue from what the pathology report calls the medial margin, but apparently was the inferior margin, and this was clear).  Estrogen receptor was 100% positive. Progesterone receptor was 97% positive. Both showed strong staining intensity.  The patient proceeded to adjuvant radiation which was recently completed 06/13/2013.  Her subsequent history is as detailed below  INTERVAL HISTORY: Alison [name] returns today for follow up of her noninvasive breast cancer, accompanied by her partner Alison [name]. Generally she is doing well, and has decided to finally have her gynecologic surgery 03/19/2015. When she has not decided is whether she wants a hysterectomy or not.  REVIEW OF SYSTEMS: She exercises by walking about 30 minutes 3 times a week or so. She also does some weights. She has a lot of equipment at home but does not belong to a gym. She complains of  aches and pains here in there which are not more intense or persistent than before. Otherwise she has had no further vaginal bleeding problems.. She does have some stress urinary incontinence. A detailed review of systems today was stable.  PAST MEDICAL HISTORY: Past Medical History  Diagnosis Date  . GERD (gastroesophageal reflux disease)   . Internal hemorrhoids   . Esophagitis   . 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 H/O adenomatous polyp of colon   . Esophageal stricture   . Arthritis   . PONV (postoperative nausea and vomiting)   . Contact lens/glasses fitting     wears contacts or glasses  . Vertigo   . Radiation 05/16/13-06/13/13    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  . Hypertension   . Hiatal hernia   . Adenomatous colon polyp   . Migraine headache   . Breast cancer ([place]) 03/2013    Left w/lumpectomy    PAST SURGICAL HISTORY: Past Surgical History  Procedure Laterality Date  . Ovarian cyst removal    . Tonsillectomy    . Total hip arthroplasty Right 2005  . Nissen fundoplication  123456  . Upper gi endoscopy    . Colonoscopy    . Breast biopsy Left 04/17/2013    Procedure: BREAST BIOPSY WITH NEEDLE LOCALIZATION;  Surgeon: Odis Hollingshead, MD;  Location: [place];  Service: General;  Laterality: Left;  . Breast lumpectomy with radioactive seed localization Left 07/12/2014    Procedure: LEFT BREAST LUMPECTOMY WITH RADIOACTIVE SEED LOCALIZATION;  Surgeon: Jackolyn Confer, MD;  Location: [place];  Service: General;  Laterality: Left;    FAMILY HISTORY Family History  Problem Relation Age  of Onset  . Heart attack Father   . Lung cancer Mother 59    smoker  . Breast cancer Maternal Aunt     half-aunt at 44   one of the patient's mother is 5 sisters (a maternal aunt) was diagnosed with breast cancer at the age of 73. There are no other breast cancers in the family to her knowledge, and no cases of ovarian cancer.  GYNECOLOGIC HISTORY:  Menarche age 25,  the patient is GX P0. She went through the change of life more than 15 years ago. She did not take hormone replacement. 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, works as a Catering manager. They have no pets. The patient is not a church attender    ADVANCED DIRECTIVES: In place; Alison [name] is [employer] power of attorney   HEALTH MAINTENANCE: Social History  Substance Use Topics  . Smoking status: Former Smoker -- 5 years    Types: Cigarettes    Quit date: 08/10/1977  . Smokeless tobacco: Never Used     Comment: She smoked for 5 years in her early 70s.    . Alcohol Use: 0.0 oz/week    0 Standard drinks or equivalent per week     Colonoscopy: 2010/ [facility]  PAP: April 2014  Bone density: 2013; showed osteopenia  Lipid panel:  Allergies  Allergen Reactions  . Adhesive [Tape]   . Morphine Rash  . Sulfonamide Derivatives Rash    Current Outpatient Prescriptions  Medication Sig Dispense Refill  . Calcium Carb-Cholecalciferol (CALCIUM + D3 PO) Take by mouth.    . meclizine (ANTIVERT) 25 MG tablet Take 1 tablet (25 mg total) by mouth 3 (three) times daily as needed for dizziness. 90 tablet 1  . metoprolol tartrate (LOPRESSOR) 25 MG tablet Take 1 tablet (25 mg total) by mouth 2 (two) times daily. 180 tablet 3  . Milk Thistle 1000 MG CAPS Take by mouth.    . Omega-3 Fatty Acids (FISH OIL) 1000 MG CAPS Take by mouth.    Marland Kitchen omeprazole (PRILOSEC) 20 MG capsule Take 20 mg by mouth 2 (two) times daily.    . Probiotic Product (PROBIOTIC ACIDOPHILUS) CAPS Take by mouth.    . SUMAtriptan (IMITREX) 20 MG/ACT nasal spray Place 1 spray (20 mg total) into the nose every 2 (two) hours as needed for migraine. 8 Inhaler 11  . TURMERIC PO Take by mouth.     No current facility-administered medications for this visit.    OBJECTIVE: Middle-aged white woman in no acute  distress Filed Vitals:   02/20/15 1347  BP: 127/80  Pulse: 79  Temp: 98.6 F (37 C)  Resp: 18     Body mass index is 22.54 kg/(m^2).    ECOG FS:1 - Symptomatic but completely ambulatory  Sclerae unicteric, pupils round and equal Oropharynx clear and moist-- no thrush or other lesions No cervical or supraclavicular adenopathy Lungs no rales or rhonchi Heart regular rate and rhythm Abd soft, nontender, positive bowel sounds MSK no focal spinal tenderness, no upper extremity lymphedema Neuro: nonfocal, well oriented, appropriate affect Breasts: The right breast is unremarkable. The left breast is status post lumpectomy and radiation. There is no evidence of local recurrence. Left axilla is benign.   LAB RESULTS:  CMP     Component Value Date/Time   NA 141 07/10/2014 1155   NA 142 05/16/2014  0959   K 3.8 07/10/2014 1155   K 4.0 05/16/2014 0959   CL 105 07/10/2014 1155   CO2 27 07/10/2014 1155   CO2 25 05/16/2014 0959   GLUCOSE 96 07/10/2014 1155   GLUCOSE 75 05/16/2014 0959   BUN 14 08/21/2014 1430   BUN 15.8 05/16/2014 0959   CREATININE 0.73 08/21/2014 1430   CREATININE 0.7 05/16/2014 0959   CREATININE 0.70 07/18/2012 0918   CALCIUM 9.0 07/10/2014 1155   CALCIUM 9.1 05/16/2014 0959   PROT 6.2* 07/10/2014 1155   PROT 6.1* 05/16/2014 0959   ALBUMIN 3.7 07/10/2014 1155   ALBUMIN 3.6 05/16/2014 0959   AST 18 07/10/2014 1155   AST 15 05/16/2014 0959   ALT 15 07/10/2014 1155   ALT 12 05/16/2014 0959   ALKPHOS 62 07/10/2014 1155   ALKPHOS 72 05/16/2014 0959   BILITOT 0.7 07/10/2014 1155   BILITOT 0.57 05/16/2014 0959   GFRNONAA >60 07/10/2014 1155   GFRNONAA 89 07/18/2012 0918   GFRAA >60 07/10/2014 1155   GFRAA >89 07/18/2012 0918    I No results found for: SPEP  Lab Results  Component Value Date   WBC 5.3 07/10/2014   NEUTROABS 3.8 07/10/2014   HGB 13.4 07/10/2014   HCT 39.9 07/10/2014   MCV 88.9 07/10/2014   PLT 256 07/10/2014      Chemistry        Component Value Date/Time   NA 141 07/10/2014 1155   NA 142 05/16/2014 0959   K 3.8 07/10/2014 1155   K 4.0 05/16/2014 0959   CL 105 07/10/2014 1155   CO2 27 07/10/2014 1155   CO2 25 05/16/2014 0959   BUN 14 08/21/2014 1430   BUN 15.8 05/16/2014 0959   CREATININE 0.73 08/21/2014 1430   CREATININE 0.7 05/16/2014 0959   CREATININE 0.70 07/18/2012 0918      Component Value Date/Time   CALCIUM 9.0 07/10/2014 1155   CALCIUM 9.1 05/16/2014 0959   ALKPHOS 62 07/10/2014 1155   ALKPHOS 72 05/16/2014 0959   AST 18 07/10/2014 1155   AST 15 05/16/2014 0959   ALT 15 07/10/2014 1155   ALT 12 05/16/2014 0959   BILITOT 0.7 07/10/2014 1155   BILITOT 0.57 05/16/2014 0959       No results found for: LABCA2  No components found for: LABCA125  No results for input(s): INR in the last 168 hours.  Urinalysis No results found for: COLORURINE  STUDIES: Six-month follow-up left breast ultrasound pending  ASSESSMENT: 69 y.o. 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  (4)  Opted against antiestrogen therapy   (5) biopsy of suspicious area in the left breast February 2016 showed atypical ductal hyperplasia  (6) endometrial biopsy 09/05/2014 showed no evidence of malignancy.  PLAN: Willoh had a great deal of uncertainty as to whether to remove the uterus or not. At this point I don't see any indication for it. As Dr. Toney Rakes is pointed out her endometrial stripe now his then a, and her biopsy was negative. She is not on tamoxifen. There is no increased risk of endometrial cancer in women with a history of breast cancer. With all this in mind she is pretty much decided she will simply do the bilateral  salpingo-oophorectomy next month as planned.  As far as her stress incontinence is concerned, we talked about "retraining" the bladder, by voiding every 2 hours or so so that when it eventually does stretch it will send the appropriate message, which it is now not sending.  As far as her sinus drainages concern I suggested loratadine to be started in November and to be continued through February. I don't think what she is dealing with is related to her history of reflux.  She is can to see me again in October. She knows to call for any problems that may develop before that visit. Chauncey Cruel, MD   02/20/2015 1:48 PM

## 2015-02-20 NOTE — Telephone Encounter (Signed)
GAVE PATIENT AVS REPORT AND APPOINTMENT FOR October 2017. PER PATIENT MID OCT F/U SCHEDULED FOR LATE OCT.

## 2015-03-05 ENCOUNTER — Ambulatory Visit: Payer: Medicare Other | Admitting: Gynecology

## 2015-03-10 HISTORY — PX: BOWEL RESECTION: SHX1257

## 2015-03-12 NOTE — Patient Instructions (Addendum)
Your procedure is scheduled on:  Tuesday,  JAN. 10, 2017  Enter through the Main Entrance of [facility] at:  6:00 A.M.  Pick up the phone at the desk and dial [phone].  Call this number if you have problems the morning of surgery: [phone].  Remember: Do NOT eat food or drink after:  MIDNIGHT Monday JAN. 9, 2017 Take these medicines the morning of surgery with a SIP OF WATER:  METOPROLOL, OMEPRAZOLE  *STOP FISH OIL AT THIS TIME  Do NOT wear jewelry (body piercing), metal hair clips/bobby pins, make-up, or nail polish. Do NOT wear lotions, powders, or perfumes.  You may wear deoderant. Do NOT shave for 48 hours prior to surgery. Do NOT bring valuables to the hospital. Contacts, dentures, or bridgework may not be worn into surgery.  Have a responsible adult drive you home and stay with you for 24 hours after your procedure

## 2015-03-13 ENCOUNTER — Encounter (HOSPITAL_COMMUNITY): Payer: Self-pay

## 2015-03-13 ENCOUNTER — Encounter: Payer: Self-pay | Admitting: Gynecology

## 2015-03-13 ENCOUNTER — Encounter (HOSPITAL_COMMUNITY)
Admission: RE | Admit: 2015-03-13 | Discharge: 2015-03-13 | Disposition: A | Discharge status: Home / Self Care | Payer: Medicare Other | Source: Ambulatory Visit | Attending: Gynecology | Admitting: Gynecology

## 2015-03-13 ENCOUNTER — Ambulatory Visit (INDEPENDENT_AMBULATORY_CARE_PROVIDER_SITE_OTHER): Payer: Medicare Other | Admitting: Gynecology

## 2015-03-13 VITALS — BP 132/80

## 2015-03-13 DIAGNOSIS — N83202 Unspecified ovarian cyst, left side: Secondary | ICD-10-CM

## 2015-03-13 DIAGNOSIS — Z853 Personal history of malignant neoplasm of breast: Secondary | ICD-10-CM

## 2015-03-13 DIAGNOSIS — Z01818 Encounter for other preprocedural examination: Secondary | ICD-10-CM | POA: Insufficient documentation

## 2015-03-13 DIAGNOSIS — N83201 Unspecified ovarian cyst, right side: Secondary | ICD-10-CM | POA: Diagnosis not present

## 2015-03-13 DIAGNOSIS — N83209 Unspecified ovarian cyst, unspecified side: Secondary | ICD-10-CM | POA: Diagnosis not present

## 2015-03-13 HISTORY — DX: Unspecified macular degeneration: H35.30

## 2015-03-13 HISTORY — DX: Polyp of corpus uteri: N84.0

## 2015-03-13 HISTORY — DX: Cardiac arrhythmia, unspecified: I49.9

## 2015-03-13 LAB — CBC
HCT: 40.2 % (ref 36.0–46.0)
Hemoglobin: 13.8 g/dL (ref 12.0–15.0)
MCH: 30.5 pg (ref 26.0–34.0)
MCHC: 34.3 g/dL (ref 30.0–36.0)
MCV: 88.9 fL (ref 78.0–100.0)
PLATELETS: 294 10*3/uL (ref 150–400)
RBC: 4.52 MIL/uL (ref 3.87–5.11)
RDW: 13.1 % (ref 11.5–15.5)
WBC: 9.2 10*3/uL (ref 4.0–10.5)

## 2015-03-13 LAB — BASIC METABOLIC PANEL
Anion gap: 9 (ref 5–15)
BUN: 21 mg/dL — AB (ref 6–20)
CO2: 29 mmol/L (ref 22–32)
CREATININE: 0.75 mg/dL (ref 0.44–1.00)
Calcium: 9.6 mg/dL (ref 8.9–10.3)
Chloride: 102 mmol/L (ref 101–111)
GFR calc non Af Amer: >60 mL/min (ref >60)
GLUCOSE: 110 mg/dL — AB (ref 65–99)
Potassium: 4.1 mmol/L (ref 3.5–5.1)
Sodium: 140 mmol/L (ref 135–145)

## 2015-03-13 MED ORDER — OXYCODONE-ACETAMINOPHEN 5-325 MG PO TABS: 1.0000 | ORAL_TABLET | ORAL | Status: DC | PRN

## 2015-03-13 NOTE — Progress Notes (Signed)
Alison [name] is an 70 y.o. female  Who presented to the office today for her preoperative examination. Patient scheduled for laparoscopic bilateral salpingo-oophorectomy next week as a result of bilateral ovarian cyst. Her history is as follows:  Her history from June 29 visit was as follows:  "Patient is a 70 year old who was referred to our practice as a courtesy of her medical oncologist Dr. Gunnar Bulla Magrinat as a result of patient's postmenopausal bleeding. Review of her record indicated that as a result of abnormality noted on left breast diagnostic mammogram in January 2015 she had undergone stereotactic biopsy but the catheter occasions that up recently noted could not be located stereotactically and the patient subsequently underwent left lumpectomy in 04/27/2013. 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 (Dr. Constance Holster tomorrow took additional tissue from what the pathology report calls the medial margin, but apparently was the inferior margin, and this was clear). Estrogen receptor was 100% positive. Progesterone receptor was 97% positive. Patient completed adjuvant radiation therapy in April 2015. She was then prescribed tamoxifen but had to discontinue because of abdominal cramps and pain and begin to develop vaginal spotting. Patient recently had been referred to Dr. Maurene Capes gastroenterologist and a CT of the abdomen was ordered on June 27 of this year in which the pathologist reported the following: IMPRESSION: 1. No acute findings within the abdomen or pelvis. 2. Left kidney cyst. 3. Lumbar spondylosis.  Patient reports many years ago she had laparotomy for bilateral ovarian cysts that were removed. She also stated that in her 16s she took hormone replacement therapy for only 2 years."  On that last office visit patient had an ultrasound and endometrial biopsy with the following noted:  Uterus measures 6.6  x 4.7 x 3.5 cm with endometrial stripe is 7.4 mm. The uterus demonstrated multiple cystic areas and thickened. A right ovarian echo-free thinwall avascular cyst measuring 4.1 x 3.0 x 3.5 cm was noted. Also a small left ovarian complex thick wall a vascular cyst measuring 1.6 x 1.0 x 1.7 cm was noted and there was no free fluid in the cul-de-sac.  Pathology report: FRAGMENTS OF ENDOMETRIAL TYPE POLYP. - BACKGROUND SECRETORY ENDOMETRIUM WITH BREAKDOWN. - NO HYPERPLASIA OR MALIGNANCY  ROMA-1 test was ordered last office visit and it was in the normal range. Patient was initially scheduled to undergo laparoscopic-assisted vaginal hysterectomy with bilateral salpingo-oophorectomy and canceled her surgery. Patient now has been off the tamoxifen in 6 months reports no vaginal bleeding and  Her follow-up ultrasound on December 19 2014 demonstrated the following:    Uterus measures 6.1 x 4.6 x 2.5 cm within meter stripe 0.5 mm. Uterus was noted to have multiple cortical cyst right ovary rim of tissue seen with thinwall echo-free cyst measuring 43 x 32 x 38 mm average size 38 mm and a 12 x 9 avascular cyst noted. Arterial blood flow was seen in the right ovary. Left ovary follicle cyst measuring 10 x 5 mm was noted. No fluid in the cul-de-sac.   Pertinent Gynecological History: Menses: post-menopausal Bleeding:  bleeding Contraception: post menopausal status DES exposure: unknown Blood transfusions: Patient had received transfusion back in 2005 during her hip replacement Sexually transmitted diseases: no past history Previous GYN Procedures: Laparotomy with bilateral ovarian cystectomy benign  Last mammogram: normal Date: Stereotactic left breast biopsy benign calcifications 2016 Last pap: normal Date: 2016 OB History: G 0, P 0  Menstrual History: Menarche age: 74 No LMP recorded. Patient is postmenopausal.    Past Medical History  Diagnosis Date  . GERD (gastroesophageal reflux disease)   .  Internal hemorrhoids   . Esophagitis   . 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 H/O adenomatous polyp of colon   . Esophageal stricture   . Arthritis   . Contact lens/glasses fitting     wears contacts or glasses  . Vertigo   . Radiation 05/16/13-06/13/13    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  . Hiatal hernia   . Adenomatous colon polyp   . Migraine headache   . Breast cancer ([facility]) 03/2013    Left w/lumpectomy  . Hypertension     patient denies HTN take metoprolol for irregular heart beat  . Dysrhythmia     irregular heart beat  . Uterine polyp   . PONV (postoperative nausea and vomiting)   . Macular degeneration     Left eye    Past Surgical History  Procedure Laterality Date  . Ovarian cyst removal    . Tonsillectomy    . Total hip arthroplasty Right 2005  . Nissen fundoplication  123456  . Upper gi endoscopy    . Colonoscopy    . Breast biopsy Left 04/17/2013    Procedure: BREAST BIOPSY WITH NEEDLE LOCALIZATION;  Surgeon: Odis Hollingshead, MD;  Location: [facility];  Service: General;  Laterality: Left;  . Breast lumpectomy with radioactive seed localization Left 07/12/2014    Procedure: LEFT BREAST LUMPECTOMY WITH RADIOACTIVE SEED LOCALIZATION;  Surgeon: Jackolyn Confer, MD;  Location: [facility];  Service: General;  Laterality: Left;    Family History  Problem Relation Age of Onset  . Heart attack Father   . Lung cancer Mother 25    smoker  . Breast cancer Maternal Aunt     half-aunt at 23    Social History:  reports that she quit smoking about 37 years ago. Her smoking use included Cigarettes. She quit after 5 years of use. She has never used smokeless tobacco. She reports that she drinks alcohol. She reports that she does not use illicit drugs.  Allergies:  Allergies  Allergen Reactions  . Adhesive [Tape]   . Morphine Rash  . Sulfonamide Derivatives Rash     (Not in a hospital admission)  REVIEW OF SYSTEMS: A ROS was performed and pertinent  positives and negatives are included in the history.  GENERAL: No fevers or chills. HEENT: No change in vision, no earache, sore throat or sinus congestion. NECK: No pain or stiffness. CARDIOVASCULAR: No chest pain or pressure. No palpitations. PULMONARY: No shortness of breath, cough or wheeze. GASTROINTESTINAL: No abdominal pain, nausea, vomiting or diarrhea, melena or bright red blood per rectum. GENITOURINARY: No urinary frequency, urgency, hesitancy or dysuria. MUSCULOSKELETAL: No joint or muscle pain, no back pain, no recent trauma. DERMATOLOGIC: No rash, no itching, no lesions. ENDOCRINE: No polyuria, polydipsia, no heat or cold intolerance. No recent change in weight. HEMATOLOGICAL: No anemia or easy bruising or bleeding. NEUROLOGIC: No headache, seizures, numbness, tingling or weakness. PSYCHIATRIC: No depression, no loss of interest in normal activity or change in sleep pattern.     Blood pressure 132/80.  Physical Exam:  HEENT:unremarkable Neck:Supple, midline, no thyroid megaly, no carotid bruits Lungs:  Clear to auscultation no rhonchi's or wheezes Heart:Regular rate and rhythm, no murmurs or gallops Breast Exam: Symmetrical in appearance no palpable mass or tenderness Abdomen: Soft nontender no rebound or guarding Pelvic:BUS  atrophic changes Vagina: Atrophic changes Cervix: Atrophic changes no lesions or discharge Uterus: Anteverted nontender Adnexa: Fullness right adnexa nontender Extremities: No cords, no edema Rectal: Not examined  Results for orders placed or performed during the hospital encounter of 03/13/15 (from the past 24 hour(s))  Basic metabolic panel     Status: Abnormal   Collection Time: 03/13/15 11:00 AM  Result Value Ref Range   Sodium 140 135 - 145 mmol/L   Potassium 4.1 3.5 - 5.1 mmol/L   Chloride 102 101 - 111 mmol/L   CO2 29 22 - 32 mmol/L   Glucose, Bld 110 (H) 65 - 99 mg/dL   BUN 21 (H) 6 - 20 mg/dL   Creatinine, Ser 0.75 0.44 - 1.00 mg/dL    Calcium 9.6 8.9 - 10.3 mg/dL   GFR calc non Af Amer >60 >60 mL/min   GFR calc Af Amer >60 >60 mL/min   Anion gap 9 5 - 15  CBC     Status: None   Collection Time: 03/13/15 11:00 AM  Result Value Ref Range   WBC 9.2 4.0 - 10.5 K/uL   RBC 4.52 3.87 - 5.11 MIL/uL   Hemoglobin 13.8 12.0 - 15.0 g/dL   HCT 40.2 36.0 - 46.0 %   MCV 88.9 78.0 - 100.0 fL   MCH 30.5 26.0 - 34.0 pg   MCHC 34.3 30.0 - 36.0 g/dL   RDW 13.1 11.5 - 15.5 %   Platelets 294 150 - 400 K/uL    No results found.  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 Patient with no further vaginal bleeding. Endometrial stripe thin and 0.5 mm. Persistent right ovarian cyst. Patient would now like to proceed with laparoscopic bilateral salpingo-oophorectomy and not hysterectomy. This appears to be a reasonable approach since her endometrial stripe is very thin she's off the tamoxifen and is having no vaginal bleeding. The risk of surgery are as follows:                        Patient was counseled as to the risk of surgery to include the following:  1. Infection (prohylactic antibiotics will be administered)  2. DVT/Pulmonary Embolism (prophylactic pneumo compression stockings will be used)  3.Trauma to internal organs requiring additional surgical procedure to repair any injury to     Internal organs requiring perhaps additional hospitalization days.  4.Hemmorhage requiring transfusion and blood products which carry risks such as             anaphylactic reaction, hepatitis and AIDS  Patient had received literature information on the procedure scheduled and all her questions were answered and fully  accepts all risk.   [facility] HMD12:39 PMTD@Note :

## 2015-03-18 MED ORDER — DEXTROSE 5 % IV SOLN
2.0000 g | INTRAVENOUS | Status: AC
  Administered 2015-03-19: 2 g via INTRAVENOUS
  Filled 2015-03-18: qty 2

## 2015-03-19 ENCOUNTER — Ambulatory Visit (HOSPITAL_COMMUNITY): Payer: Medicare Other | Admitting: Anesthesiology

## 2015-03-19 ENCOUNTER — Encounter (HOSPITAL_COMMUNITY): Payer: Self-pay

## 2015-03-19 ENCOUNTER — Encounter (HOSPITAL_COMMUNITY)
Admission: RE | Disposition: A | Discharge status: Home / Self Care | Payer: Self-pay | Source: Ambulatory Visit | Attending: Gynecology

## 2015-03-19 ENCOUNTER — Ambulatory Visit (HOSPITAL_COMMUNITY)
Admission: RE | Admit: 2015-03-19 | Discharge: 2015-03-19 | Disposition: A | Discharge status: Home / Self Care | Payer: Medicare Other | Source: Ambulatory Visit | Attending: Gynecology | Admitting: Gynecology

## 2015-03-19 DIAGNOSIS — Z882 Allergy status to sulfonamides status: Secondary | ICD-10-CM | POA: Insufficient documentation

## 2015-03-19 DIAGNOSIS — N281 Cyst of kidney, acquired: Secondary | ICD-10-CM | POA: Insufficient documentation

## 2015-03-19 DIAGNOSIS — Z87891 Personal history of nicotine dependence: Secondary | ICD-10-CM | POA: Diagnosis not present

## 2015-03-19 DIAGNOSIS — N950 Postmenopausal bleeding: Secondary | ICD-10-CM | POA: Diagnosis not present

## 2015-03-19 DIAGNOSIS — K449 Diaphragmatic hernia without obstruction or gangrene: Secondary | ICD-10-CM | POA: Insufficient documentation

## 2015-03-19 DIAGNOSIS — Z86010 Personal history of colonic polyps: Secondary | ICD-10-CM | POA: Diagnosis not present

## 2015-03-19 DIAGNOSIS — N83201 Unspecified ovarian cyst, right side: Secondary | ICD-10-CM | POA: Diagnosis not present

## 2015-03-19 DIAGNOSIS — Z96641 Presence of right artificial hip joint: Secondary | ICD-10-CM | POA: Insufficient documentation

## 2015-03-19 DIAGNOSIS — Z885 Allergy status to narcotic agent status: Secondary | ICD-10-CM | POA: Diagnosis not present

## 2015-03-19 DIAGNOSIS — I10 Essential (primary) hypertension: Secondary | ICD-10-CM | POA: Insufficient documentation

## 2015-03-19 DIAGNOSIS — M8580 Other specified disorders of bone density and structure, unspecified site: Secondary | ICD-10-CM | POA: Diagnosis not present

## 2015-03-19 DIAGNOSIS — N838 Other noninflammatory disorders of ovary, fallopian tube and broad ligament: Secondary | ICD-10-CM | POA: Diagnosis not present

## 2015-03-19 DIAGNOSIS — K219 Gastro-esophageal reflux disease without esophagitis: Secondary | ICD-10-CM | POA: Insufficient documentation

## 2015-03-19 DIAGNOSIS — Z853 Personal history of malignant neoplasm of breast: Secondary | ICD-10-CM

## 2015-03-19 DIAGNOSIS — N83202 Unspecified ovarian cyst, left side: Secondary | ICD-10-CM | POA: Diagnosis not present

## 2015-03-19 DIAGNOSIS — N393 Stress incontinence (female) (male): Secondary | ICD-10-CM | POA: Insufficient documentation

## 2015-03-19 DIAGNOSIS — N83292 Other ovarian cyst, left side: Secondary | ICD-10-CM | POA: Diagnosis not present

## 2015-03-19 DIAGNOSIS — D270 Benign neoplasm of right ovary: Secondary | ICD-10-CM | POA: Diagnosis not present

## 2015-03-19 DIAGNOSIS — G43909 Migraine, unspecified, not intractable, without status migrainosus: Secondary | ICD-10-CM | POA: Insufficient documentation

## 2015-03-19 DIAGNOSIS — H3530 Unspecified macular degeneration: Secondary | ICD-10-CM | POA: Diagnosis not present

## 2015-03-19 DIAGNOSIS — M1990 Unspecified osteoarthritis, unspecified site: Secondary | ICD-10-CM | POA: Diagnosis not present

## 2015-03-19 HISTORY — PX: LAPAROSCOPIC BILATERAL SALPINGO OOPHERECTOMY: SHX5890

## 2015-03-19 SURGERY — SALPINGO-OOPHORECTOMY, BILATERAL, LAPAROSCOPIC
Anesthesia: General | Site: Abdomen | Laterality: Bilateral

## 2015-03-19 MED ORDER — KETOROLAC TROMETHAMINE 30 MG/ML IJ SOLN
INTRAMUSCULAR | Status: DC | PRN
  Administered 2015-03-19: 30 mg via INTRAVENOUS

## 2015-03-19 MED ORDER — ROCURONIUM BROMIDE 100 MG/10ML IV SOLN
INTRAVENOUS | Status: AC
  Filled 2015-03-19: qty 1

## 2015-03-19 MED ORDER — ONDANSETRON HCL 4 MG/2ML IJ SOLN
INTRAMUSCULAR | Status: DC | PRN
  Administered 2015-03-19: 4 mg via INTRAVENOUS

## 2015-03-19 MED ORDER — FENTANYL CITRATE (PF) 250 MCG/5ML IJ SOLN
INTRAMUSCULAR | Status: AC
  Filled 2015-03-19: qty 5

## 2015-03-19 MED ORDER — PROPOFOL 10 MG/ML IV BOLUS
INTRAVENOUS | Status: DC | PRN
  Administered 2015-03-19: 130 mg via INTRAVENOUS

## 2015-03-19 MED ORDER — METHYLENE BLUE 1 % INJ SOLN
INTRAMUSCULAR | Status: AC
  Filled 2015-03-19: qty 1

## 2015-03-19 MED ORDER — ROCURONIUM BROMIDE 100 MG/10ML IV SOLN
INTRAVENOUS | Status: DC | PRN
  Administered 2015-03-19: 45 mg via INTRAVENOUS
  Administered 2015-03-19: 5 mg via INTRAVENOUS

## 2015-03-19 MED ORDER — SCOPOLAMINE 1 MG/3DAYS TD PT72
MEDICATED_PATCH | TRANSDERMAL | Status: AC
  Filled 2015-03-19: qty 1

## 2015-03-19 MED ORDER — FENTANYL CITRATE (PF) 100 MCG/2ML IJ SOLN: 25.0000 ug | INTRAMUSCULAR | Status: DC | PRN

## 2015-03-19 MED ORDER — DEXAMETHASONE SODIUM PHOSPHATE 4 MG/ML IJ SOLN
INTRAMUSCULAR | Status: DC | PRN
  Administered 2015-03-19: 4 mg via INTRAVENOUS

## 2015-03-19 MED ORDER — PROPOFOL 500 MG/50ML IV EMUL
INTRAVENOUS | Status: DC | PRN
  Administered 2015-03-19: 50 mL via INTRAVENOUS

## 2015-03-19 MED ORDER — GLYCOPYRROLATE 0.2 MG/ML IJ SOLN
INTRAMUSCULAR | Status: DC | PRN
  Administered 2015-03-19: 0.6 mg via INTRAVENOUS

## 2015-03-19 MED ORDER — ONDANSETRON HCL 4 MG/2ML IJ SOLN
INTRAMUSCULAR | Status: AC
  Filled 2015-03-19: qty 2

## 2015-03-19 MED ORDER — FENTANYL CITRATE (PF) 100 MCG/2ML IJ SOLN
INTRAMUSCULAR | Status: DC | PRN
  Administered 2015-03-19 (×3): 50 ug via INTRAVENOUS
  Administered 2015-03-19: 100 ug via INTRAVENOUS

## 2015-03-19 MED ORDER — LIDOCAINE HCL (CARDIAC) 20 MG/ML IV SOLN
INTRAVENOUS | Status: AC
  Filled 2015-03-19: qty 5

## 2015-03-19 MED ORDER — BUPIVACAINE HCL (PF) 0.25 % IJ SOLN
INTRAMUSCULAR | Status: AC
  Filled 2015-03-19: qty 30

## 2015-03-19 MED ORDER — BUPIVACAINE HCL (PF) 0.25 % IJ SOLN
INTRAMUSCULAR | Status: DC | PRN
  Administered 2015-03-19: 10 mL

## 2015-03-19 MED ORDER — LACTATED RINGERS IR SOLN
Status: DC | PRN
  Administered 2015-03-19: 3000 mL

## 2015-03-19 MED ORDER — NEOSTIGMINE METHYLSULFATE 10 MG/10ML IV SOLN
INTRAVENOUS | Status: DC | PRN
  Administered 2015-03-19: 3 mg via INTRAVENOUS

## 2015-03-19 MED ORDER — DEXAMETHASONE SODIUM PHOSPHATE 4 MG/ML IJ SOLN
INTRAMUSCULAR | Status: AC
  Filled 2015-03-19: qty 1

## 2015-03-19 MED ORDER — PROMETHAZINE HCL 25 MG/ML IJ SOLN: 6.2500 mg | INTRAMUSCULAR | Status: DC | PRN

## 2015-03-19 MED ORDER — CEFOTETAN DISODIUM 2 G IJ SOLR: 2.0000 g | INTRAMUSCULAR | Status: DC

## 2015-03-19 MED ORDER — LACTATED RINGERS IV SOLN
INTRAVENOUS | Status: DC
  Administered 2015-03-19: 08:00:00 via INTRAVENOUS
  Administered 2015-03-19: 10 mL/h via INTRAVENOUS

## 2015-03-19 MED ORDER — NEOSTIGMINE METHYLSULFATE 10 MG/10ML IV SOLN
INTRAVENOUS | Status: AC
  Filled 2015-03-19: qty 1

## 2015-03-19 MED ORDER — HEPARIN SODIUM (PORCINE) 5000 UNIT/ML IJ SOLN
INTRAMUSCULAR | Status: AC
  Filled 2015-03-19: qty 1

## 2015-03-19 MED ORDER — LIDOCAINE HCL (CARDIAC) 20 MG/ML IV SOLN
INTRAVENOUS | Status: DC | PRN
  Administered 2015-03-19: 50 mg via INTRAVENOUS

## 2015-03-19 MED ORDER — KETOROLAC TROMETHAMINE 30 MG/ML IJ SOLN
INTRAMUSCULAR | Status: AC
  Filled 2015-03-19: qty 1

## 2015-03-19 MED ORDER — PROPOFOL 10 MG/ML IV BOLUS
INTRAVENOUS | Status: AC
  Filled 2015-03-19: qty 20

## 2015-03-19 MED ORDER — GLYCOPYRROLATE 0.2 MG/ML IJ SOLN
INTRAMUSCULAR | Status: AC
  Filled 2015-03-19: qty 3

## 2015-03-19 SURGICAL SUPPLY — 34 items
BAG SPEC RTRVL LRG 6X4 10 (ENDOMECHANICALS) ×2
BARRIER ADHS 3X4 INTERCEED (GAUZE/BANDAGES/DRESSINGS) IMPLANT
BRR ADH 4X3 ABS CNTRL BYND (GAUZE/BANDAGES/DRESSINGS)
CABLE HIGH FREQUENCY MONO STRZ (ELECTRODE) IMPLANT
CLOTH BEACON ORANGE TIMEOUT ST (SAFETY) ×3 IMPLANT
COVER MAYO STAND STRL (DRAPES) ×3 IMPLANT
DRSG COVADERM PLUS 2X2 (GAUZE/BANDAGES/DRESSINGS) ×6 IMPLANT
DRSG OPSITE POSTOP 3X4 (GAUZE/BANDAGES/DRESSINGS) ×2 IMPLANT
FILTER SMOKE EVAC LAPAROSHD (FILTER) ×3 IMPLANT
GLOVE BIOGEL PI IND STRL 7.0 (GLOVE) ×1 IMPLANT
GLOVE BIOGEL PI IND STRL 8 (GLOVE) ×1 IMPLANT
GLOVE BIOGEL PI INDICATOR 7.0 (GLOVE) ×2
GLOVE BIOGEL PI INDICATOR 8 (GLOVE) ×2
GLOVE ECLIPSE 7.5 STRL STRAW (GLOVE) ×6 IMPLANT
GOWN STRL REUS W/TWL LRG LVL3 (GOWN DISPOSABLE) ×6 IMPLANT
HEMOSTAT SURGICEL 4X8 (HEMOSTASIS) ×2 IMPLANT
LIQUID BAND (GAUZE/BANDAGES/DRESSINGS) ×2 IMPLANT
NS IRRIG 1000ML POUR BTL (IV SOLUTION) ×3 IMPLANT
PACK LAPAROSCOPY BASIN (CUSTOM PROCEDURE TRAY) ×3 IMPLANT
PAD POSITIONING PINK XL (MISCELLANEOUS) ×3 IMPLANT
POUCH SPECIMEN RETRIEVAL 10MM (ENDOMECHANICALS) ×4 IMPLANT
SET IRRIG TUBING LAPAROSCOPIC (IRRIGATION / IRRIGATOR) ×3 IMPLANT
SHEARS HARMONIC ACE PLUS 36CM (ENDOMECHANICALS) ×2 IMPLANT
SLEEVE XCEL OPT CAN 5 100 (ENDOMECHANICALS) ×3 IMPLANT
SOLUTION ELECTROLUBE (MISCELLANEOUS) IMPLANT
SUT VIC AB 3-0 PS2 18 (SUTURE) ×3
SUT VIC AB 3-0 PS2 18XBRD (SUTURE) ×1 IMPLANT
SUT VICRYL 0 UR6 27IN ABS (SUTURE) ×3 IMPLANT
TOWEL OR 17X24 6PK STRL BLUE (TOWEL DISPOSABLE) ×6 IMPLANT
TRAY FOLEY CATH SILVER 14FR (SET/KITS/TRAYS/PACK) ×3 IMPLANT
TROCAR HASSON GELL 12X100 (TROCAR) ×2 IMPLANT
TROCAR XCEL NON-BLD 11X100MML (ENDOMECHANICALS) ×3 IMPLANT
TROCAR XCEL NON-BLD 5MMX100MML (ENDOMECHANICALS) ×3 IMPLANT
WARMER LAPAROSCOPE (MISCELLANEOUS) ×3 IMPLANT

## 2015-03-19 NOTE — Transfer of Care (Signed)
Immediate Anesthesia Transfer of Care Note  Patient: Alison [name]  Procedure(s) Performed: Procedure(s): LAPAROSCOPIC BILATERAL SALPINGO OOPHORECTOMY WITH EXTENSIVE LYSIS OF ABDOMINAL AND PELVIC ADHESIONS AND  PELVIC WASINGS (Bilateral)  Patient Location: PACU  Anesthesia Type:General  Level of Consciousness: awake, alert  and oriented  Airway & Oxygen Therapy: Patient Spontanous Breathing and Patient connected to nasal cannula oxygen  Post-op Assessment: Report given to RN and Post -op Vital signs reviewed and stable  Post vital signs: Reviewed and stable  Last Vitals:  Filed Vitals:   03/19/15 0643  BP: 115/77  Pulse: 63  Temp: 36.7 C  Resp: 16    Complications: No apparent anesthesia complications

## 2015-03-19 NOTE — H&P (View-Only) (Signed)
[facility]  [phone] [phone]     ID: Alison [name] OB: 03-13-1945  MR#: [id number]  [id number]  PCP: Ival Bible, MD GYN:  Uvaldo Rising MD SU: Jackolyn Confer MD OTHER MD: Lance Morin MD, [name] MD  CHIEF COMPLAINT: Noninvasive left breast cancer CURRENT TREATMENT: observation.   BREAST CANCER HISTORY: From the original intake note:  Alison [name] had screening mammography 03/21/2013 suggesting a change in her left breast. Unilateral left diagnostic mammography 03/23/2013 showed a 3 mm cluster of calcifications in the left breast lower inner quadrant. There was a second cluster measuring 1.1 cm in the left breast upper inner quadrant. Stereotactic biopsy of the upper inner quadrant calcifications was attempted, but the calcifications could not be located could not be located stereotactically and the patient proceeded to left lumpectomy 04/17/2013. 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 The inferior margin was at 0.1 cm. (Dr. Constance Holster tomorrow took additional tissue from what the pathology report calls the medial margin, but apparently was the inferior margin, and this was clear).  Estrogen receptor was 100% positive. Progesterone receptor was 97% positive. Both showed strong staining intensity.  The patient proceeded to adjuvant radiation which was recently completed 06/13/2013.  Her subsequent history is as detailed below  INTERVAL HISTORY: Alison [name] returns today for follow up of her noninvasive breast cancer, accompanied by her partner [name]. Generally she is doing well, and has decided to finally have her gynecologic surgery 03/19/2015. When she has not decided is whether she wants a hysterectomy or not.  REVIEW OF SYSTEMS: She exercises by walking about 30 minutes 3 times a week or so. She also does some weights. She has a lot of equipment at home but does not belong to a gym. She complains of  aches and pains here in there which are not more intense or persistent than before. Otherwise she has had no further vaginal bleeding problems.. She does have some stress urinary incontinence. A detailed review of systems today was stable.  PAST MEDICAL HISTORY: Past Medical History  Diagnosis Date  . GERD (gastroesophageal reflux disease)   . Internal hemorrhoids   . Esophagitis   . 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 H/O adenomatous polyp of colon   . Esophageal stricture   . Arthritis   . PONV (postoperative nausea and vomiting)   . Contact lens/glasses fitting     wears contacts or glasses  . Vertigo   . Radiation 05/16/13-06/13/13    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  . Hypertension   . Hiatal hernia   . Adenomatous colon polyp   . Migraine headache   . Breast cancer ([facility]) 03/2013    Left w/lumpectomy    PAST SURGICAL HISTORY: Past Surgical History  Procedure Laterality Date  . Ovarian cyst removal    . Tonsillectomy    . Total hip arthroplasty Right 2005  . Nissen fundoplication  123456  . Upper gi endoscopy    . Colonoscopy    . Breast biopsy Left 04/17/2013    Procedure: BREAST BIOPSY WITH NEEDLE LOCALIZATION;  Surgeon: Odis Hollingshead, MD;  Location: [facility];  Service: General;  Laterality: Left;  . Breast lumpectomy with radioactive seed localization Left 07/12/2014    Procedure: LEFT BREAST LUMPECTOMY WITH RADIOACTIVE SEED LOCALIZATION;  Surgeon: Jackolyn Confer, MD;  Location: [facility];  Service: General;  Laterality: Left;    FAMILY HISTORY Family History  Problem Relation Age  of Onset  . Heart attack Father   . Lung cancer Mother 20    smoker  . Breast cancer Maternal Aunt     half-aunt at 32   one of the patient's mother is 5 sisters (a maternal aunt) was diagnosed with breast cancer at the age of 59. There are no other breast cancers in the family to her knowledge, and no cases of ovarian cancer.  GYNECOLOGIC HISTORY:  Menarche age 78,  the patient is GX P0. She went through the change of life more than 15 years ago. She did not take hormone replacement. 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, works as a Catering manager. They have no pets. The patient is not a church attender    ADVANCED DIRECTIVES: In place; [name] is [employer] power of attorney   HEALTH MAINTENANCE: Social History  Substance Use Topics  . Smoking status: Former Smoker -- 5 years    Types: Cigarettes    Quit date: 08/10/1977  . Smokeless tobacco: Never Used     Comment: She smoked for 5 years in her early 110s.    . Alcohol Use: 0.0 oz/week    0 Standard drinks or equivalent per week     Colonoscopy: 2010/ [facility]  PAP: April 2014  Bone density: 2013; showed osteopenia  Lipid panel:  Allergies  Allergen Reactions  . Adhesive [Tape]   . Morphine Rash  . Sulfonamide Derivatives Rash    Current Outpatient Prescriptions  Medication Sig Dispense Refill  . Calcium Carb-Cholecalciferol (CALCIUM + D3 PO) Take by mouth.    . meclizine (ANTIVERT) 25 MG tablet Take 1 tablet (25 mg total) by mouth 3 (three) times daily as needed for dizziness. 90 tablet 1  . metoprolol tartrate (LOPRESSOR) 25 MG tablet Take 1 tablet (25 mg total) by mouth 2 (two) times daily. 180 tablet 3  . Milk Thistle 1000 MG CAPS Take by mouth.    . Omega-3 Fatty Acids (FISH OIL) 1000 MG CAPS Take by mouth.    Marland Kitchen omeprazole (PRILOSEC) 20 MG capsule Take 20 mg by mouth 2 (two) times daily.    . Probiotic Product (PROBIOTIC ACIDOPHILUS) CAPS Take by mouth.    . SUMAtriptan (IMITREX) 20 MG/ACT nasal spray Place 1 spray (20 mg total) into the nose every 2 (two) hours as needed for migraine. 8 Inhaler 11  . TURMERIC PO Take by mouth.     No current facility-administered medications for this visit.    OBJECTIVE: Middle-aged white woman in no acute  distress Filed Vitals:   02/20/15 1347  BP: 127/80  Pulse: 79  Temp: 98.6 F (37 C)  Resp: 18     Body mass index is 22.54 kg/(m^2).    ECOG FS:1 - Symptomatic but completely ambulatory  Sclerae unicteric, pupils round and equal Oropharynx clear and moist-- no thrush or other lesions No cervical or supraclavicular adenopathy Lungs no rales or rhonchi Heart regular rate and rhythm Abd soft, nontender, positive bowel sounds MSK no focal spinal tenderness, no upper extremity lymphedema Neuro: nonfocal, well oriented, appropriate affect Breasts: The right breast is unremarkable. The left breast is status post lumpectomy and radiation. There is no evidence of local recurrence. Left axilla is benign.   LAB RESULTS:  CMP     Component Value Date/Time   NA 141 07/10/2014 1155   NA 142 05/16/2014  0959   K 3.8 07/10/2014 1155   K 4.0 05/16/2014 0959   CL 105 07/10/2014 1155   CO2 27 07/10/2014 1155   CO2 25 05/16/2014 0959   GLUCOSE 96 07/10/2014 1155   GLUCOSE 75 05/16/2014 0959   BUN 14 08/21/2014 1430   BUN 15.8 05/16/2014 0959   CREATININE 0.73 08/21/2014 1430   CREATININE 0.7 05/16/2014 0959   CREATININE 0.70 07/18/2012 0918   CALCIUM 9.0 07/10/2014 1155   CALCIUM 9.1 05/16/2014 0959   PROT 6.2* 07/10/2014 1155   PROT 6.1* 05/16/2014 0959   ALBUMIN 3.7 07/10/2014 1155   ALBUMIN 3.6 05/16/2014 0959   AST 18 07/10/2014 1155   AST 15 05/16/2014 0959   ALT 15 07/10/2014 1155   ALT 12 05/16/2014 0959   ALKPHOS 62 07/10/2014 1155   ALKPHOS 72 05/16/2014 0959   BILITOT 0.7 07/10/2014 1155   BILITOT 0.57 05/16/2014 0959   GFRNONAA >60 07/10/2014 1155   GFRNONAA 89 07/18/2012 0918   GFRAA >60 07/10/2014 1155   GFRAA >89 07/18/2012 0918    I No results found for: SPEP  Lab Results  Component Value Date   WBC 5.3 07/10/2014   NEUTROABS 3.8 07/10/2014   HGB 13.4 07/10/2014   HCT 39.9 07/10/2014   MCV 88.9 07/10/2014   PLT 256 07/10/2014      Chemistry        Component Value Date/Time   NA 141 07/10/2014 1155   NA 142 05/16/2014 0959   K 3.8 07/10/2014 1155   K 4.0 05/16/2014 0959   CL 105 07/10/2014 1155   CO2 27 07/10/2014 1155   CO2 25 05/16/2014 0959   BUN 14 08/21/2014 1430   BUN 15.8 05/16/2014 0959   CREATININE 0.73 08/21/2014 1430   CREATININE 0.7 05/16/2014 0959   CREATININE 0.70 07/18/2012 0918      Component Value Date/Time   CALCIUM 9.0 07/10/2014 1155   CALCIUM 9.1 05/16/2014 0959   ALKPHOS 62 07/10/2014 1155   ALKPHOS 72 05/16/2014 0959   AST 18 07/10/2014 1155   AST 15 05/16/2014 0959   ALT 15 07/10/2014 1155   ALT 12 05/16/2014 0959   BILITOT 0.7 07/10/2014 1155   BILITOT 0.57 05/16/2014 0959       No results found for: LABCA2  No components found for: LABCA125  No results for input(s): INR in the last 168 hours.  Urinalysis No results found for: COLORURINE  STUDIES: Six-month follow-up left breast ultrasound pending  ASSESSMENT: 70 y.o. Alison [name] woman status post left lumpectomy 04/17/2013 for ductal carcinoma in situ grade 2 measuring 2.5 cm, estrogen and progesterone receptor positive with negative though close margins  (1) status post adjuvant radiation completed 06/13/2013  (2) osteopenia  (3) genetics testing omitted as family history felt to be  low risk.  (4)  Opted against antiestrogen therapy   (5) biopsy of suspicious area in the left breast February 2016 showed atypical ductal hyperplasia  (6) endometrial biopsy 09/05/2014 showed no evidence of malignancy.  PLAN: [place] had a great deal of uncertainty as to whether to remove the uterus or not. At this point I don't see any indication for it. As Dr. Toney Rakes is pointed out her endometrial stripe now his then a, and her biopsy was negative. She is not on tamoxifen. There is no increased risk of endometrial cancer in women with a history of breast cancer. With all this in mind she is pretty much decided she will simply do the bilateral  salpingo-oophorectomy next month as planned.  As far as her stress incontinence is concerned, we talked about "retraining" the bladder, by voiding every 2 hours or so so that when it eventually does stretch it will send the appropriate message, which it is now not sending.  As far as her sinus drainages concern I suggested loratadine to be started in November and to be continued through February. I don't think what she is dealing with is related to her history of reflux.  She is can to see me again in October. She knows to call for any problems that may develop before that visit. Chauncey Cruel, MD   02/20/2015 1:48 PM

## 2015-03-19 NOTE — Anesthesia Preprocedure Evaluation (Addendum)
Anesthesia Evaluation  Patient identified by MRN, date of birth, ID band Patient awake    History of Anesthesia Complications (+) PONV  Airway Mallampati: II  TM Distance: >3 FB Neck ROM: Full    Dental   Pulmonary former smoker,    breath sounds clear to auscultation       Cardiovascular hypertension, + dysrhythmias  Rhythm:Regular Rate:Normal     Neuro/Psych    GI/Hepatic Neg liver ROS, hiatal hernia, GERD  ,  Endo/Other  negative endocrine ROS  Renal/GU negative Renal ROS     Musculoskeletal  (+) Arthritis ,   Abdominal   Peds  Hematology   Anesthesia Other Findings   Reproductive/Obstetrics                            Anesthesia Physical Anesthesia Plan  ASA: III  Anesthesia Plan: General   Post-op Pain Management:    Induction: Intravenous  Airway Management Planned: Oral ETT  Additional Equipment:   Intra-op Plan:   Post-operative Plan: Extubation in OR  Informed Consent: I have reviewed the patients History and Physical, chart, labs and discussed the procedure including the risks, benefits and alternatives for the proposed anesthesia with the patient or authorized representative who has indicated his/her understanding and acceptance.   Dental advisory given  Plan Discussed with: CRNA and Anesthesiologist  Anesthesia Plan Comments:         Anesthesia Quick Evaluation

## 2015-03-19 NOTE — Interval H&P Note (Signed)
History and Physical Interval Note:  03/19/2015 7:03 AM  Alison [name]  has presented today for surgery, with the diagnosis of ovarian cyst, history of breast cancer  The various methods of treatment have been discussed with the patient and family. After consideration of risks, benefits and other options for treatment, the patient has consented to  Procedure(s): LAPAROSCOPIC BILATERAL SALPINGO OOPHORECTOMY (Bilateral) as a surgical intervention .  The patient's history has been reviewed, patient examined, no change in status, stable for surgery.  I have reviewed the patient's chart and labs.  Questions were answered to the patient's satisfaction.     Terrance Mass

## 2015-03-19 NOTE — H&P (View-Only) (Signed)
Alison [name] is an 70 y.o. female  Who presented to the office today for her preoperative examination. Patient scheduled for laparoscopic bilateral salpingo-oophorectomy next week as a result of bilateral ovarian cyst. Her history is as follows:  Her history from June 29 visit was as follows:  "Patient is a 70 year old who was referred to our practice as a courtesy of her medical oncologist Dr. Gunnar Bulla Magrinat as a result of patient's postmenopausal bleeding. Review of her record indicated that as a result of abnormality noted on left breast diagnostic mammogram in January 2015 she had undergone stereotactic biopsy but the catheter occasions that up recently noted could not be located stereotactically and the patient subsequently underwent left lumpectomy in 04/27/2013. 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 (Dr. Constance Holster tomorrow took additional tissue from what the pathology report calls the medial margin, but apparently was the inferior margin, and this was clear). Estrogen receptor was 100% positive. Progesterone receptor was 97% positive. Patient completed adjuvant radiation therapy in April 2015. She was then prescribed tamoxifen but had to discontinue because of abdominal cramps and pain and begin to develop vaginal spotting. Patient recently had been referred to Dr. Maurene Capes gastroenterologist and a CT of the abdomen was ordered on June 27 of this year in which the pathologist reported the following: IMPRESSION: 1. No acute findings within the abdomen or pelvis. 2. Left kidney cyst. 3. Lumbar spondylosis.  Patient reports many years ago she had laparotomy for bilateral ovarian cysts that were removed. She also stated that in her 70s she took hormone replacement therapy for only 2 years."  On that last office visit patient had an ultrasound and endometrial biopsy with the following noted:  Uterus measures 6.6  x 4.7 x 3.5 cm with endometrial stripe is 7.4 mm. The uterus demonstrated multiple cystic areas and thickened. A right ovarian echo-free thinwall avascular cyst measuring 4.1 x 3.0 x 3.5 cm was noted. Also a small left ovarian complex thick wall a vascular cyst measuring 1.6 x 1.0 x 1.7 cm was noted and there was no free fluid in the cul-de-sac.  Pathology report: FRAGMENTS OF ENDOMETRIAL TYPE POLYP. - BACKGROUND SECRETORY ENDOMETRIUM WITH BREAKDOWN. - NO HYPERPLASIA OR MALIGNANCY  ROMA-1 test was ordered last office visit and it was in the normal range. Patient was initially scheduled to undergo laparoscopic-assisted vaginal hysterectomy with bilateral salpingo-oophorectomy and canceled her surgery. Patient now has been off the tamoxifen in 6 months reports no vaginal bleeding and  Her follow-up ultrasound on December 19 2014 demonstrated the following:    Uterus measures 6.1 x 4.6 x 2.5 cm within meter stripe 0.5 mm. Uterus was noted to have multiple cortical cyst right ovary rim of tissue seen with thinwall echo-free cyst measuring 43 x 32 x 38 mm average size 38 mm and a 12 x 9 avascular cyst noted. Arterial blood flow was seen in the right ovary. Left ovary follicle cyst measuring 10 x 5 mm was noted. No fluid in the cul-de-sac.   Pertinent Gynecological History: Menses: post-menopausal Bleeding:  bleeding Contraception: post menopausal status DES exposure: unknown Blood transfusions: Patient had received transfusion back in 2005 during her hip replacement Sexually transmitted diseases: no past history Previous GYN Procedures: Laparotomy with bilateral ovarian cystectomy benign  Last mammogram: normal Date: Stereotactic left breast biopsy benign calcifications 2016 Last pap: normal Date: 2016 OB History: G 0, P 0  Menstrual History: Menarche age: 50 No LMP recorded. Patient is postmenopausal.    Past Medical History  Diagnosis Date  . GERD (gastroesophageal reflux disease)   .  Internal hemorrhoids   . Esophagitis   . 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 H/O adenomatous polyp of colon   . Esophageal stricture   . Arthritis   . Contact lens/glasses fitting     wears contacts or glasses  . Vertigo   . Radiation 05/16/13-06/13/13    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  . Hiatal hernia   . Adenomatous colon polyp   . Migraine headache   . Breast cancer ([place]) 03/2013    Left w/lumpectomy  . Hypertension     patient denies HTN take metoprolol for irregular heart beat  . Dysrhythmia     irregular heart beat  . Uterine polyp   . PONV (postoperative nausea and vomiting)   . Macular degeneration     Left eye    Past Surgical History  Procedure Laterality Date  . Ovarian cyst removal    . Tonsillectomy    . Total hip arthroplasty Right 2005  . Nissen fundoplication  123456  . Upper gi endoscopy    . Colonoscopy    . Breast biopsy Left 04/17/2013    Procedure: BREAST BIOPSY WITH NEEDLE LOCALIZATION;  Surgeon: Odis Hollingshead, MD;  Location: [facility];  Service: General;  Laterality: Left;  . Breast lumpectomy with radioactive seed localization Left 07/12/2014    Procedure: LEFT BREAST LUMPECTOMY WITH RADIOACTIVE SEED LOCALIZATION;  Surgeon: Jackolyn Confer, MD;  Location: [facility];  Service: General;  Laterality: Left;    Family History  Problem Relation Age of Onset  . Heart attack Father   . Lung cancer Mother 26    smoker  . Breast cancer Maternal Aunt     half-aunt at 52    Social History:  reports that she quit smoking about 37 years ago. Her smoking use included Cigarettes. She quit after 5 years of use. She has never used smokeless tobacco. She reports that she drinks alcohol. She reports that she does not use illicit drugs.  Allergies:  Allergies  Allergen Reactions  . Adhesive [Tape]   . Morphine Rash  . Sulfonamide Derivatives Rash     (Not in a hospital admission)  REVIEW OF SYSTEMS: A ROS was performed and pertinent  positives and negatives are included in the history.  GENERAL: No fevers or chills. HEENT: No change in vision, no earache, sore throat or sinus congestion. NECK: No pain or stiffness. CARDIOVASCULAR: No chest pain or pressure. No palpitations. PULMONARY: No shortness of breath, cough or wheeze. GASTROINTESTINAL: No abdominal pain, nausea, vomiting or diarrhea, melena or bright red blood per rectum. GENITOURINARY: No urinary frequency, urgency, hesitancy or dysuria. MUSCULOSKELETAL: No joint or muscle pain, no back pain, no recent trauma. DERMATOLOGIC: No rash, no itching, no lesions. ENDOCRINE: No polyuria, polydipsia, no heat or cold intolerance. No recent change in weight. HEMATOLOGICAL: No anemia or easy bruising or bleeding. NEUROLOGIC: No headache, seizures, numbness, tingling or weakness. PSYCHIATRIC: No depression, no loss of interest in normal activity or change in sleep pattern.     Blood pressure 132/80.  Physical Exam:  HEENT:unremarkable Neck:Supple, midline, no thyroid megaly, no carotid bruits Lungs:  Clear to auscultation no rhonchi's or wheezes Heart:Regular rate and rhythm, no murmurs or gallops Breast Exam: Symmetrical in appearance no palpable mass or tenderness Abdomen: Soft nontender no rebound or guarding Pelvic:BUS  atrophic changes Vagina: Atrophic changes Cervix: Atrophic changes no lesions or discharge Uterus: Anteverted nontender Adnexa: Fullness right adnexa nontender Extremities: No cords, no edema Rectal: Not examined  Results for orders placed or performed during the hospital encounter of 03/13/15 (from the past 24 hour(s))  Basic metabolic panel     Status: Abnormal   Collection Time: 03/13/15 11:00 AM  Result Value Ref Range   Sodium 140 135 - 145 mmol/L   Potassium 4.1 3.5 - 5.1 mmol/L   Chloride 102 101 - 111 mmol/L   CO2 29 22 - 32 mmol/L   Glucose, Bld 110 (H) 65 - 99 mg/dL   BUN 21 (H) 6 - 20 mg/dL   Creatinine, Ser 0.75 0.44 - 1.00 mg/dL    Calcium 9.6 8.9 - 10.3 mg/dL   GFR calc non Af Amer >60 >60 mL/min   GFR calc Af Amer >60 >60 mL/min   Anion gap 9 5 - 15  CBC     Status: None   Collection Time: 03/13/15 11:00 AM  Result Value Ref Range   WBC 9.2 4.0 - 10.5 K/uL   RBC 4.52 3.87 - 5.11 MIL/uL   Hemoglobin 13.8 12.0 - 15.0 g/dL   HCT 40.2 36.0 - 46.0 %   MCV 88.9 78.0 - 100.0 fL   MCH 30.5 26.0 - 34.0 pg   MCHC 34.3 30.0 - 36.0 g/dL   RDW 13.1 11.5 - 15.5 %   Platelets 294 150 - 400 K/uL    No results found.  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 Patient with no further vaginal bleeding. Endometrial stripe thin and 0.5 mm. Persistent right ovarian cyst. Patient would now like to proceed with laparoscopic bilateral salpingo-oophorectomy and not hysterectomy. This appears to be a reasonable approach since her endometrial stripe is very thin she's off the tamoxifen and is having no vaginal bleeding. The risk of surgery are as follows:                        Patient was counseled as to the risk of surgery to include the following:  1. Infection (prohylactic antibiotics will be administered)  2. DVT/Pulmonary Embolism (prophylactic pneumo compression stockings will be used)  3.Trauma to internal organs requiring additional surgical procedure to repair any injury to     Internal organs requiring perhaps additional hospitalization days.  4.Hemmorhage requiring transfusion and blood products which carry risks such as             anaphylactic reaction, hepatitis and AIDS  Patient had received literature information on the procedure scheduled and all her questions were answered and fully  accepts all risk.   [facility] HMD12:39 PMTD@Note :

## 2015-03-19 NOTE — Anesthesia Procedure Notes (Signed)
Procedure Name: Intubation Date/Time: 03/19/2015 7:30 AM Performed by: Vernice Jefferson Pre-anesthesia Checklist: Patient identified, Emergency Drugs available, Suction available, Patient being monitored and Timeout performed Patient Re-evaluated:Patient Re-evaluated prior to inductionOxygen Delivery Method: Circle system utilized Preoxygenation: Pre-oxygenation with 100% oxygen Intubation Type: IV induction Ventilation: Mask ventilation without difficulty Laryngoscope Size: Mac and 3 Grade View: Grade II Tube type: Oral Tube size: 7.0 mm Number of attempts: 1 Airway Equipment and Method: Stylet Placement Confirmation: ETT inserted through vocal cords under direct vision,  positive ETCO2 and breath sounds checked- equal and bilateral Secured at: 20 cm Tube secured with: Tape Dental Injury: Teeth and Oropharynx as per pre-operative assessment

## 2015-03-19 NOTE — Op Note (Signed)
Operative Note  03/19/2015  9:12 AM  PATIENT:  Alison [name]  70 y.o. female  PRE-OPERATIVE DIAGNOSIS:  ovarian cyst, history of breast cancer  POST-OPERATIVE DIAGNOSIS:  ovarian cyst, history of breast cancer  PROCEDURE:  Procedure(s): LAPAROSCOPIC BILATERAL SALPINGO OOPHORECTOMY WITH EXTENSIVE LYSIS OF ABDOMINAL AND PELVIC ADHESIONS AND  PELVIC WASINGS  SURGEON:  Surgeon(s): Terrance Mass, MD Anastasio Auerbach, MD  ANESTHESIA:   general  FINDINGS: Extensive small bowel adhesions to the right pelvic sidewall and right tube and ovary. A 4 cm smooth surface right ovarian cyst was also adherent in this pocket of adhesions. Left pelvic sidewall adhesions. Gross appearance of the appendix and liver surface appeared normal.  DESCRIPTION OF OPERATION: The patient was taken to the operating room where she underwent a successful general endotracheal anesthesia. A timeout was undertaken and patient's allergies as well as procedure to be performed were discussed prior to commencement. The abdomen and vagina were prepped and draped in usual sterile fashion. Bimanual examination demonstrated an anteverted uterus and a Hulka tenaculum was placed for manipulation during laparoscopic procedure. A Foley catheter had been inserted to monitor urinary output. Patient received prophylaxis antibiotic with Cefotan 2 g IV. Also for DVT prophylaxis she had PSA stockings. After the abdomen and vagina were prepped in the usual sterile fashion and drains were placed because of patient's prior history of fundoplication an open laparoscopic technique to enter safely into the abdominal cavity was undertaken. A semilunar incision was made underneath the umbilicus. Meticulous dissection to the level of the fascia was undertaken. The fascia was grasp and tented and a small incision was made to enter the pelvic cavity after the peritoneum was incised as well. A purse string suture of 0 Vicryl was used and a Hassan  trocar was used to enter the cavity safely. After the fascia was secured around the sheath of the [facility] a pneumoperitoneum was established. Approximately 3 L of carbon dioxide were insufflated into the peritoneal cavity. Under laparoscopic guidance 2 additional 5 mm ports were made in the patient's right and left lower abdomen. It was at this point the extensive amount of small bowel that was adhered to the right pelvic sidewall engulfing the right ovarian cyst as well requiring meticulous dissection to separate the small bowel safely. Once this was accomplished the right tube and ovary was identified as well as a right infundibulopelvic ligament and the right ureter coursing medial to the area of planned transection. With the use the Harmonic scalpel the infundibulopelvic ligament was coaptated and transected. The remaining mesosalpinx was coaptated and transected to the level of the uterotubal junction and the proximal tube was coaptated and transected with Harmonic scalpel. The specimen was placed in the pelvic cavity and attention was placed to the left adnexa. Of note pelvic washings were obtained prior to procedure starting. After materials dissection to free some adhesions from the left pelvic sidewall once again the left tube and ovary were placed under tension the left ureter was identified medial to the planned area of transection of the infundibulopelvic ligament. Close to the ovary and the infundibulopelvic ligament was coaptated and transected with Harmonic scalpel. The mesosalpinx was coaptated and transected to the level of the left uterotubal junction. Of note the left tube was partially adhered to the fundus of the uterus which required dissection as well. Both specimens were placed in the cul-de-sac. A 5 mm hysteroscope was placed through the 5 mm port and through the 10 mm port  and Endopouch was inserted and both specimens were placed in Endopouch and retrieved through the umbilicus incision and  submitted for histological evaluation. The [facility] trocar was then replaced the pneumoperitoneum was reestablished. The pelvic cavity was, was irrigated with normal saline solution hemostasis was evident. Surgicel was placed on the anterior cul-de-sac because of some friability of tissue otherwise no bleeding noted. Bladder serosa was intact although slightly bruised from the extensive adhesional lysis. The carbon dioxide was removed from the peritoneal cavity and the instruments were removed. The subumbilical fascia was secured with the previously placed pursestring 0 Vicryl suture. The subcutaneous tissue was reapproximated with 3-0 Vicryl suture. All 3 incision skin port sites were reapproximated with Dermabond glue. For postoperative analgesia 0.25% Marcaine was infiltrated for a total of 10 cc. Bandages were placed on the incisions. The Hulka tenaculum was removed. The cervix was inspected there was no active bleeding. The patient was extubated and transferred to recovery room stable vital signs. She did receive 30 mg of Toradol in route to the recovery room. Blood loss was minimal. No complications.  ESTIMATED BLOOD LOSS: Minimal  Intake/Output Summary (Last 24 hours) at 03/19/15 0912 Last data filed at 03/19/15 [id number]  Gross per 24 hour  Intake   1500 ml  Output    250 ml  Net   1250 ml     BLOOD ADMINISTERED:none   LOCAL MEDICATIONS USED:  MARCAINE   0.25% injected into all 3 incision ports for postoperative analgesia for a total 10 cc  SPECIMEN:  Source of Specimen:  #1 pelvic washings, #2 bilateral tubes and ovaries  DISPOSITION OF SPECIMEN:  PATHOLOGY  COUNTS:  YES  PLAN OF CARE: Transfer to PACU  [facility] HMD9:12 AMTD@

## 2015-03-19 NOTE — Discharge Instructions (Signed)

## 2015-03-19 NOTE — Anesthesia Postprocedure Evaluation (Signed)
Anesthesia Post Note  Patient: Alison [name]  Procedure(s) Performed: Procedure(s) (LRB): LAPAROSCOPIC BILATERAL SALPINGO OOPHORECTOMY WITH EXTENSIVE LYSIS OF ABDOMINAL AND PELVIC ADHESIONS AND  PELVIC WASINGS (Bilateral)  Patient location during evaluation: PACU Anesthesia Type: General Level of consciousness: awake Pain management: pain level controlled Vital Signs Assessment: post-procedure vital signs reviewed and stable Respiratory status: spontaneous breathing Cardiovascular status: stable Anesthetic complications: no    Last Vitals:  Filed Vitals:   03/19/15 0643  BP: 115/77  Pulse: 63  Temp: 36.7 C  Resp: 16    Last Pain: There were no vitals filed for this visit.               EDWARDS,Yosselin Zoeller

## 2015-03-20 ENCOUNTER — Encounter (HOSPITAL_COMMUNITY): Payer: Self-pay | Admitting: Gynecology

## 2015-03-27 ENCOUNTER — Telehealth: Payer: Self-pay | Admitting: *Deleted

## 2015-03-27 NOTE — Telephone Encounter (Signed)
Band-Aid

## 2015-03-27 NOTE — Telephone Encounter (Signed)
Pt had surgery on 03/19/15 laparoscopic bilateral salpingo-oophorectomy states at incision there is a small area where the glue has opened it up, pt just noticed, asked if she should just cover up with bandage or steri-strip? No pain in this area, drainage.  Please advise

## 2015-03-27 NOTE — Telephone Encounter (Signed)
Pt informed with the below. 

## 2015-04-03 ENCOUNTER — Encounter: Payer: Self-pay | Admitting: Gynecology

## 2015-04-03 ENCOUNTER — Ambulatory Visit (INDEPENDENT_AMBULATORY_CARE_PROVIDER_SITE_OTHER): Payer: Medicare Other | Admitting: Gynecology

## 2015-04-03 VITALS — BP 122/78

## 2015-04-03 DIAGNOSIS — Z8742 Personal history of other diseases of the female genital tract: Secondary | ICD-10-CM

## 2015-04-03 DIAGNOSIS — Z09 Encounter for follow-up examination after completed treatment for conditions other than malignant neoplasm: Secondary | ICD-10-CM

## 2015-04-03 DIAGNOSIS — Z853 Personal history of malignant neoplasm of breast: Secondary | ICD-10-CM

## 2015-04-03 NOTE — Progress Notes (Signed)
   Patient is a 70 year old who presented to the office for her 2 weeks postop visit. Patient with personal history of breast cancer and bilateral ovarian cysts underwent laparoscopic bilateral salpingo-oophorectomy with extensive abdominal pelvic adhesio-lysis. Patient is doing well. Findings from surgery as well as pathology report and pictures were shared with the patient as follows:  FINDINGS: Extensive small bowel adhesions to the right pelvic sidewall and right tube and ovary. A 4 cm smooth surface right ovarian cyst was also adherent in this pocket of adhesions. Left pelvic sidewall adhesions. Gross appearance of the appendix and liver surface appeared normal.  Diagnosis 1. Ovary and fallopian tube, right - BENIGN SEROUS CYSTADENOMA. - BENIGN FALLOPIAN TUBE WITH PARATUBAL CYSTS. 2. Ovary and fallopian tube, left - BENIGN OVARY WITH INCLUSION CYSTS. - BENIGN FALLOPIAN TUBE WITH PARATUBAL CYSTS  Exam: Abdomen: Soft nontender no rebound or guarding Laparoscopic ports almost completely healed. Pelvic: Bimanual examination no palpable mass or tenderness Rectal exam not done  Assessment/plan: 70 year old patient with personal history of breast cancer status post bilateral salpingo-oophorectomy doing well postop 2 weeks. Pathology report benign. Patient scheduled to return back in one year for annual exam or when necessary. She'll continue to follow-up with her oncologist.

## 2015-06-03 ENCOUNTER — Other Ambulatory Visit: Payer: Self-pay | Admitting: *Deleted

## 2015-12-23 ENCOUNTER — Telehealth: Payer: Self-pay | Admitting: Oncology

## 2015-12-23 NOTE — Telephone Encounter (Signed)
10/30 appointment rescheduled to 11/09 per patient request.

## 2016-01-06 ENCOUNTER — Ambulatory Visit: Payer: Medicare Other | Admitting: Oncology

## 2016-01-16 ENCOUNTER — Ambulatory Visit (HOSPITAL_BASED_OUTPATIENT_CLINIC_OR_DEPARTMENT_OTHER): Payer: Medicare Other | Admitting: Oncology

## 2016-01-16 ENCOUNTER — Encounter: Payer: Self-pay | Admitting: Oncology

## 2016-01-16 DIAGNOSIS — Z86000 Personal history of in-situ neoplasm of breast: Secondary | ICD-10-CM

## 2016-01-16 DIAGNOSIS — D0512 Intraductal carcinoma in situ of left breast: Secondary | ICD-10-CM | POA: Insufficient documentation

## 2016-01-16 MED ORDER — ANASTROZOLE 1 MG PO TABS: 1.0000 mg | ORAL_TABLET | Freq: Every day | ORAL | 4 refills | Status: DC

## 2016-01-16 NOTE — Progress Notes (Signed)
[name]  [phone] [phone]) [phone]     ID: [name] OB: 1945-08-03  MR#: [id number]  GBT#:[id number]  PCP: Ival Bible, MD GYN:  Uvaldo Rising MD SU: Jackolyn Confer MD OTHER MD: Lance Morin MD, [name] MD  CHIEF COMPLAINT: Noninvasive left breast cancer  CURRENT TREATMENT: considering anastrozole  BREAST CANCER HISTORY: From the original intake note:  Alison [name] had screening mammography 03/21/2013 suggesting a change in her left breast. Unilateral left diagnostic mammography 03/23/2013 showed a 3 mm cluster of calcifications in the left breast lower inner quadrant. There was a second cluster measuring 1.1 cm in the left breast upper inner quadrant. Stereotactic biopsy of the upper inner quadrant calcifications was attempted, but the calcifications could not be located could not be located stereotactically and the patient proceeded to left lumpectomy 04/17/2013. 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 The inferior margin was at 0.1 cm. (Dr. Constance Holster tomorrow took additional tissue from what the pathology report calls the medial margin, but apparently was the inferior margin, and this was clear).  Estrogen receptor was 100% positive. Progesterone receptor was 97% positive. Both showed strong staining intensity.  The patient proceeded to adjuvant radiation which was recently completed 06/13/2013.  Her subsequent history is as detailed below  INTERVAL HISTORY: Ozie returns today for follow up of her ductal carcinoma in situ accompanied by her partner [name]. They have biotin RV and attributed all over, including the [place] in [place].  REVIEW OF SYSTEMS: Brenlynn continues to be concerned about her uterus. She had a recent ultrasound under Dr. Rudi Rummage which was favorable she hasn't had any more bleeding episodes. She is exercising chiefly by walking and you have a gym at home which they use. She has  an irregular heartbeat at times. A detailed review of systems today was otherwise stable  PAST MEDICAL HISTORY: Past Medical History:  Diagnosis Date  . Adenomatous colon polyp   . Arthritis   . 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 Breast cancer ([place]) 03/2013   Left w/lumpectomy  . Contact lens/glasses fitting    wears contacts or glasses  . Dysrhythmia    irregular heart beat  . Esophageal stricture   . Esophagitis   . GERD (gastroesophageal reflux disease)   . H/O adenomatous polyp of colon   . Hiatal hernia   . Hypertension    patient denies HTN take metoprolol for irregular heart beat  . Internal hemorrhoids   . Macular degeneration    Left eye  . Migraine headache   . PONV (postoperative nausea and vomiting)   . Radiation 05/16/13-06/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  . Uterine polyp   . Vertigo     PAST SURGICAL HISTORY: Past Surgical History:  Procedure Laterality Date  . BREAST BIOPSY Left 04/17/2013   Procedure: BREAST BIOPSY WITH NEEDLE LOCALIZATION;  Surgeon: Odis Hollingshead, MD;  Location: [place] [facility];  Service: General;  Laterality: Left;  . BREAST LUMPECTOMY WITH RADIOACTIVE SEED LOCALIZATION Left 07/12/2014   Procedure: LEFT BREAST LUMPECTOMY WITH RADIOACTIVE SEED LOCALIZATION;  Surgeon: Jackolyn Confer, MD;  Location: [facility];  Service: General;  Laterality: Left;  . COLONOSCOPY    . LAPAROSCOPIC BILATERAL SALPINGO OOPHERECTOMY Bilateral 03/19/2015   Procedure: LAPAROSCOPIC BILATERAL SALPINGO OOPHORECTOMY WITH EXTENSIVE LYSIS OF ABDOMINAL AND PELVIC ADHESIONS AND  PELVIC WASINGS;  Surgeon: Terrance Mass, MD;  Location: [facility];  Service: Gynecology;  Laterality: Bilateral;  .  NISSEN FUNDOPLICATION  2006  . OVARIAN CYST REMOVAL    . TONSILLECTOMY    . TOTAL HIP ARTHROPLASTY Right 2005  . UPPER GI ENDOSCOPY      FAMILY HISTORY Family History  Problem Relation Age of Onset  . Heart attack Father   . Lung cancer Mother 39    smoker    . Breast cancer Maternal Aunt     half-aunt at 74   one of the patient's mother is 5 sisters (a maternal aunt) was diagnosed with breast cancer at the age of 48. There are no other breast cancers in the family to her knowledge, and no cases of ovarian cancer.  GYNECOLOGIC HISTORY:  Menarche age 24, the patient is GX P0. She went through the change of life more than 15 years ago. She did not take hormone replacement. 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 They have no pets. The patient is not a church attender    ADVANCED DIRECTIVES: In place; Cordelia Pen is [employer] power of attorney   HEALTH MAINTENANCE: Social History  Substance Use Topics  . Smoking status: Former Smoker    Years: 5.00    Types: Cigarettes    Quit date: 08/10/1977  . Smokeless tobacco: Never Used     Comment: She smoked for 5 years in her early 32s.    . Alcohol use 0.0 oz/week     Colonoscopy: 2010/ [facility]  PAP: April 2014  Bone density: 2013; showed osteopenia  Lipid panel:  Allergies  Allergen Reactions  . Adhesive [Tape]   . Morphine Rash  . Sulfonamide Derivatives Rash    Current Outpatient Prescriptions  Medication Sig Dispense Refill  . aspirin 81 MG tablet Take 81 mg by mouth daily.    . Calcium Carb-Cholecalciferol (CALCIUM + D3 PO) Take 2 tablets by mouth daily.     . meclizine (ANTIVERT) 25 MG tablet Take 1 tablet (25 mg total) by mouth 3 (three) times daily as needed for dizziness. 90 tablet 1  . metoprolol tartrate (LOPRESSOR) 25 MG tablet Take 1 tablet (25 mg total) by mouth 2 (two) times daily. 180 tablet 3  . Milk Thistle 1000 MG CAPS Take 2 tablets by mouth daily.     . Omega-3 Fatty Acids (FISH OIL) 1000 MG CAPS Take 3 capsules by mouth daily.     Marland Kitchen omeprazole (PRILOSEC) 20 MG capsule Take 20 mg by mouth 2 (two) times  daily.    Marland Kitchen oxyCODONE-acetaminophen (PERCOCET) 5-325 MG tablet Take 1 tablet by mouth every 4 (four) hours as needed for severe pain. 30 tablet 0  . Probiotic Product (PROBIOTIC ACIDOPHILUS) CAPS Take 2 tablets by mouth daily.     . SUMAtriptan (IMITREX) 20 MG/ACT nasal spray Place 1 spray (20 mg total) into the nose every 2 (two) hours as needed for migraine. 8 Inhaler 11  . TURMERIC PO Take 2 tablets by mouth daily.      No current facility-administered medications for this visit.     OBJECTIVE: Middle-aged white woman who appears well Vitals:   01/16/16 1303  BP: 137/76  Pulse: 78  Resp: 18  Temp: 98.2 F (36.8 C)     Body mass index is 22.3 kg/m.    ECOG FS:0 - Asymptomatic  Sclerae unicteric, EOMs intact Oropharynx clear and moist No cervical or supraclavicular adenopathy  Lungs no rales or rhonchi Heart regular rate and rhythm Abd soft, nontender, positive bowel sounds MSK no focal spinal tenderness, no upper extremity lymphedema Neuro: nonfocal, well oriented, appropriate affect Breasts: the right breast is benign with a left breast is status post lumpectomy and radiation. There is no evidence of local recurrence. Left axilla is benign.    LAB RESULTS:  CMP     Component Value Date/Time   NA 140 03/13/2015 1100   NA 142 05/16/2014 0959   K 4.1 03/13/2015 1100   K 4.0 05/16/2014 0959   CL 102 03/13/2015 1100   CO2 29 03/13/2015 1100   CO2 25 05/16/2014 0959   GLUCOSE 110 (H) 03/13/2015 1100   GLUCOSE 75 05/16/2014 0959   BUN 21 (H) 03/13/2015 1100   BUN 15.8 05/16/2014 0959   CREATININE 0.75 03/13/2015 1100   CREATININE 0.7 05/16/2014 0959   CALCIUM 9.6 03/13/2015 1100   CALCIUM 9.1 05/16/2014 0959   PROT 6.2 (L) 07/10/2014 1155   PROT 6.1 (L) 05/16/2014 0959   ALBUMIN 3.7 07/10/2014 1155   ALBUMIN 3.6 05/16/2014 0959   AST 18 07/10/2014 1155   AST 15 05/16/2014 0959   ALT 15 07/10/2014 1155   ALT 12 05/16/2014 0959   ALKPHOS 62 07/10/2014 1155    ALKPHOS 72 05/16/2014 0959   BILITOT 0.7 07/10/2014 1155   BILITOT 0.57 05/16/2014 0959   GFRNONAA >60 03/13/2015 1100   GFRNONAA 89 07/18/2012 0918   GFRAA >60 03/13/2015 1100   GFRAA >89 07/18/2012 0918    I No results found for: SPEP  Lab Results  Component Value Date   WBC 9.2 03/13/2015   NEUTROABS 3.8 07/10/2014   HGB 13.8 03/13/2015   HCT 40.2 03/13/2015   MCV 88.9 03/13/2015   PLT 294 03/13/2015      Chemistry      Component Value Date/Time   NA 140 03/13/2015 1100   NA 142 05/16/2014 0959   K 4.1 03/13/2015 1100   K 4.0 05/16/2014 0959   CL 102 03/13/2015 1100   CO2 29 03/13/2015 1100   CO2 25 05/16/2014 0959   BUN 21 (H) 03/13/2015 1100   BUN 15.8 05/16/2014 0959   CREATININE 0.75 03/13/2015 1100   CREATININE 0.7 05/16/2014 0959      Component Value Date/Time   CALCIUM 9.6 03/13/2015 1100   CALCIUM 9.1 05/16/2014 0959   ALKPHOS 62 07/10/2014 1155   ALKPHOS 72 05/16/2014 0959   AST 18 07/10/2014 1155   AST 15 05/16/2014 0959   ALT 15 07/10/2014 1155   ALT 12 05/16/2014 0959   BILITOT 0.7 07/10/2014 1155   BILITOT 0.57 05/16/2014 0959       No results found for: LABCA2  No components found for: LABCA125  No results for input(s): INR in the last 168 hours.  Urinalysis No results found for: COLORURINE  STUDIES: Ultrasound today: Uterus measures 6.1 x 4.6 x 2.5 cm within meter stripe 0.5 mm. Uterus was noted to have multiple cortical cyst right ovary rim of tissue seen with thinwall echo-free cyst measuring 43 x 32 x 38 mm average size 38 mm and a 12 x 9 avascular cyst noted. Arterial blood flow was seen in the right ovary. Left ovary follicle cyst measuring 10 x 5 mm was noted. No fluid in the cul-de-sac. daughter-in-law who  ASSESSMENT: 70 y.o. Jule Ser woman status post left lumpectomy 04/17/2013 for ductal carcinoma in situ grade 2 measuring 2.5 cm, estrogen and progesterone receptor positive  with negative though close margins  (1)  status post adjuvant radiation completed 06/13/2013  (2) osteopenia  (3) genetics testing omitted as family history felt to be  low risk.  (4)  Opted against antiestrogen therapy   (5) biopsy of suspicious area in the left breast February 2016 showed atypical ductal hyperplasia  (6) endometrial biopsy 09/05/2014 showed no evidence of malignancy.  PLAN: Charise is now nearly 3 years out from definitive surgery for her noninvasive breast cancer, with no evidence of disease recurrence. This is very favorable  She is alarmed when she hears stories of breast cancer recurrence and wonders if she met a mistake not taking anti-estrogens. We again discussed tamoxifen and anastrozole. She would not be a good candidate for tamoxifen given her history of endometrial concerns.  Anastrozole and the other hand what if anything help as far as the uterus is concerned. We discussed the possible toxicities, side effects and complications of this agent, which works by preventing estrogen production in the body.  After muchdiscussionshe tells me she is going to"think about it". I went ahead and put the prescription in for her in case she wants to proceed. She has follow-up with Dr. Toney Rakes for January.   I am referring her to Dr. Collene Mares with that in mind.  She is can return to see me in one year. She knows to call for any problems that may develop before that visit. Chauncey Cruel, MD   01/16/2016 1:26 PM

## 2016-04-15 ENCOUNTER — Encounter: Payer: Medicare Other | Admitting: Gynecology

## 2016-04-20 ENCOUNTER — Encounter: Payer: Self-pay | Admitting: Gynecology

## 2016-04-20 ENCOUNTER — Ambulatory Visit (INDEPENDENT_AMBULATORY_CARE_PROVIDER_SITE_OTHER): Payer: Medicare Other | Admitting: Gynecology

## 2016-04-20 VITALS — BP 146/80 | Ht 68.0 in | Wt 145.0 lb

## 2016-04-20 DIAGNOSIS — Z853 Personal history of malignant neoplasm of breast: Secondary | ICD-10-CM

## 2016-04-20 DIAGNOSIS — Z01419 Encounter for gynecological examination (general) (routine) without abnormal findings: Secondary | ICD-10-CM | POA: Diagnosis not present

## 2016-04-20 MED ORDER — FLUCONAZOLE 150 MG PO TABS: 150.0000 mg | ORAL_TABLET | Freq: Once | ORAL | 2 refills | Status: AC

## 2016-04-20 NOTE — Progress Notes (Signed)
Alison [name] February 06, 1946 [id number]   History:    71 y.o.  for annual gyn exam with no complaints today. Her PCP has been doing her blood work and her vaccines are up-to-date. Review of patient's record indicated that in January 2017 patient underwent laparoscopic bilateral salpingo-oophorectomy with extensive lysis of abdominal pelvic adhesions and pelvic washings as a result of ovarian cysts and history of breast cancer. Findings during surgery demonstrated the following:  FINDINGS: Extensive small bowel adhesions to the right pelvic sidewall and right tube and ovary. A 4 cm smooth surface right ovarian cyst was also adherent in this pocket of adhesions. Left pelvic sidewall adhesions. Gross appearance of the appendix and liver surface appeared normal.  Her pathology report demonstrated the following: Diagnosis 1. Ovary and fallopian tube, right - BENIGN SEROUS CYSTADENOMA. - BENIGN FALLOPIAN TUBE WITH PARATUBAL CYSTS. 2. Ovary and fallopian tube, left - BENIGN OVARY WITH INCLUSION CYSTS. - BENIGN FALLOPIAN TUBE WITH PARATUBAL CYSTS  Patient in November 2017 underwent laparotomy with lysis of adhesion and small bowel resection as a result of small bowel obstruction by general surgical team at [facility]. She is asymptomatic today. Patient with no previous history of any abnormal Pap smears. Her colonoscopy was in 2013 reported to be normal. She is overdue for mammogram the last study was in 2016 as was her bone density study.     Past medical history,surgical history, family history and social history were all reviewed and documented in the EPIC chart.  Gynecologic History No LMP recorded. Patient is postmenopausal. Contraception: post menopausal status Last Pap: 2016lts were: normal Last mammogram: 2016lts were: see above   Obstetric History OB History  Gravida Para Term Preterm AB Living  0 0 0 0 0 0  SAB TAB Ectopic Multiple Live Births  0 0 0 0           ROS: A  ROS was performed and pertinent positives and negatives are included in the history.  GENERAL: No fevers or chills. HEENT: No change in vision, no earache, sore throat or sinus congestion. NECK: No pain or stiffness. CARDIOVASCULAR: No chest pain or pressure. No palpitations. PULMONARY: No shortness of breath, cough or wheeze. GASTROINTESTINAL: No abdominal pain, nausea, vomiting or diarrhea, melena or bright red blood per rectum. GENITOURINARY: No urinary frequency, urgency, hesitancy or dysuria. MUSCULOSKELETAL: No joint or muscle pain, no back pain, no recent trauma. DERMATOLOGIC: No rash, no itching, no lesions. ENDOCRINE: No polyuria, polydipsia, no heat or cold intolerance. No recent change in weight. HEMATOLOGICAL: No anemia or easy bruising or bleeding. NEUROLOGIC: No headache, seizures, numbness, tingling or weakness. PSYCHIATRIC: No depression, no loss of interest in normal activity or change in sleep pattern.     Exam: chaperone present  BP (!) 146/80   Ht 5\' 8"  (1.727 m)   Wt 145 lb (65.8 kg)   BMI 22.05 kg/m   Body mass index is 22.05 kg/m.  General appearance : Well developed well nourished female. No acute distress HEENT: Eyes: no retinal hemorrhage or exudates,  Neck supple, trachea midline, no carotid bruits, no thyroidmegaly Lungs: Clear to auscultation, no rhonchi or wheezes, or rib retractions  Heart: Regular rate and rhythm, no murmurs or gallops Breast:Examined in sitting and supine position were symmetrical in appearance, no palpable masses or tenderness,  no skin retraction, no nipple inversion, no nipple discharge, no skin discoloration, no axillary or supraclavicular lymphadenopathy Abdomen: no palpable masses or tenderness, no rebound or guarding Extremities: no  edema or skin discoloration or tenderness  Pelvic:  Bartholin, Urethra, Skene Glands: Within normal limits             Vagina: No gross lesions or discharge, atrophic changes   Cervix: No gross lesions or  discharge  Uterus  antevertedmal size, shape and consistency, non-tender and mobile  Adnexa  Without masses or tenderness  Anus and perineum  normal   Rectovaginal  normal sphincter tone without palpated masses or tenderness             Hemoccult PCP provides     Assessment/Plan:  71 y.o. female for annual exam doing well PCP has been doing her blood work and vaccines are up-to-date. Patient 2017 had laparoscopic bilateral salpingo-oophorectomy as a result of ovarian cysts and history of breast cancer benign pathology as described above. November 2017 patient had bowel resection as a result of small bowel obstruction. Patient doing well otherwise. Pap smear no longer needed according to new guidelines. Patient was provided with requisition to schedule her overdue mammogram and bone density study. We discussed importance of calcium vitamin D and weightbearing exercises for osteoporosis prevention.    Terrance Mass MD, 12:45 PM 04/20/2016

## 2016-06-25 ENCOUNTER — Telehealth: Payer: Self-pay | Admitting: Oncology

## 2016-06-25 NOTE — Telephone Encounter (Signed)
Per ROI form mailed pt records to [facility]

## 2016-07-22 ENCOUNTER — Encounter: Payer: Self-pay | Admitting: Gynecology

## 2016-12-03 ENCOUNTER — Encounter: Payer: Self-pay | Admitting: Gastroenterology

## 2017-01-14 ENCOUNTER — Ambulatory Visit: Payer: Medicare Other | Admitting: Oncology

## 2017-02-24 IMAGING — CT CT ABD-PELV W/ CM
2 of 5 series · 16 of 46 positions shown, 18 images · IV contrast (Omnipaque 300)
Comparison: None

CLINICAL DATA: Several week history of crampy lower abdominal pain.

EXAM:
CT ABDOMEN AND PELVIS WITH CONTRAST
TECHNIQUE: Multidetector CT imaging of the abdomen and pelvis was performed
using the standard protocol following bolus administration of
intravenous contrast.
CONTRAST:  100mL OMNIPAQUE IOHEXOL 300 MG/ML  SOLN

[Series 2: abd/ pel 5mm · axial · 0.65mm/px · z∈[-476,-116]mm · 13 of 81 slices shown, 15 images]
[im 5/81  soft-tissue]
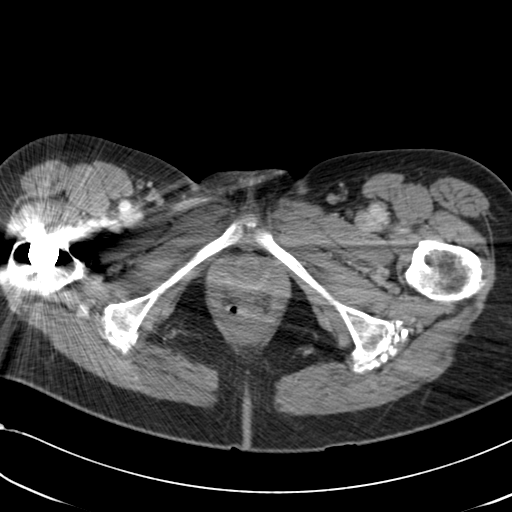
[im 5/81  bone]
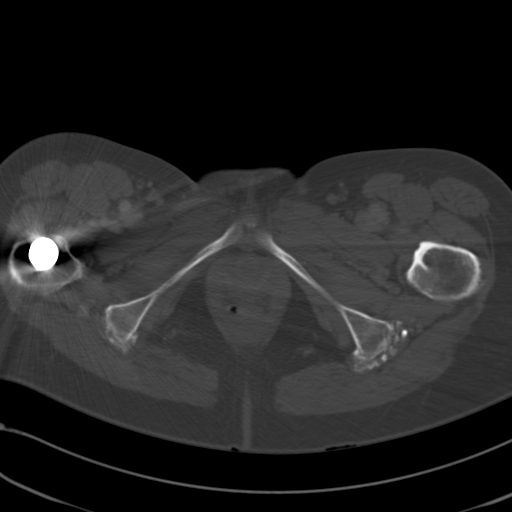
[im 13/81  soft-tissue]
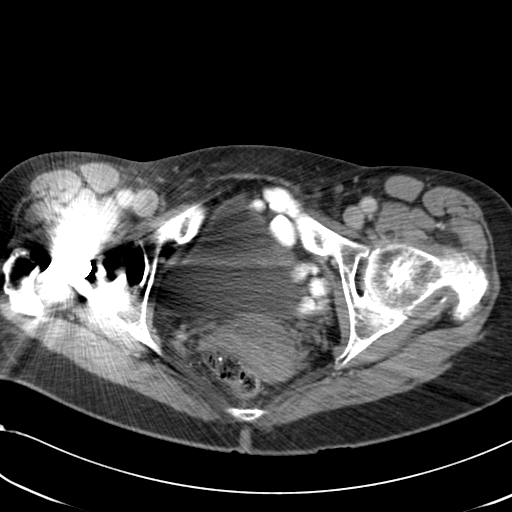
[im 17/81  soft-tissue]
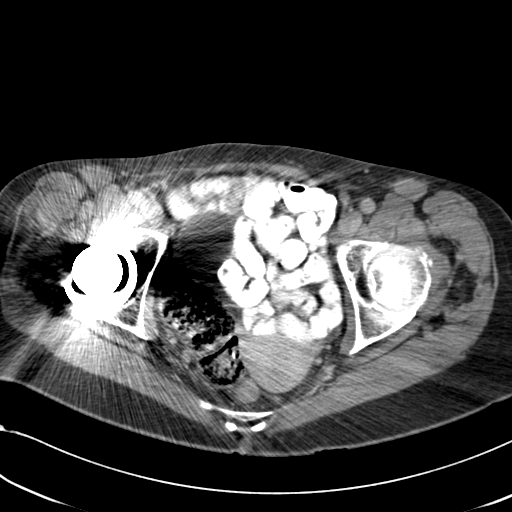
[im 25/81  soft-tissue]
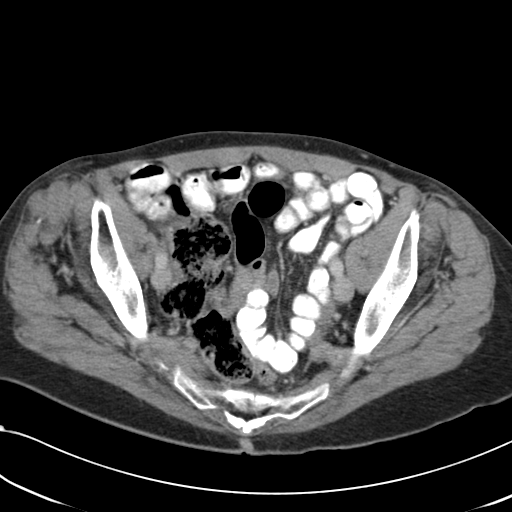
[im 29/81  soft-tissue]
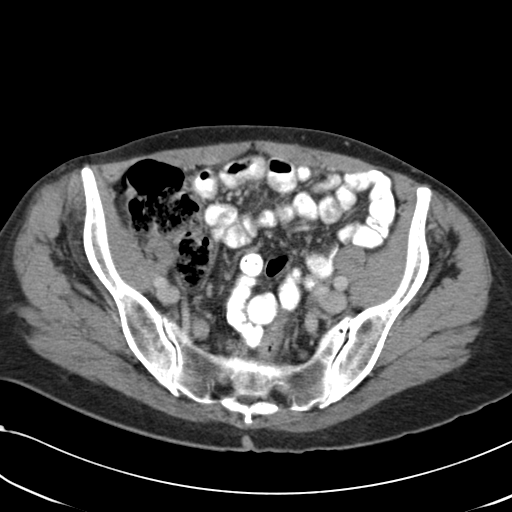
[im 37/81  soft-tissue]
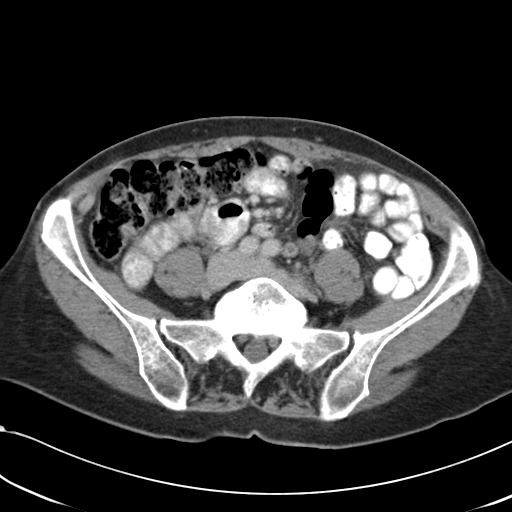
[im 41/81  soft-tissue]
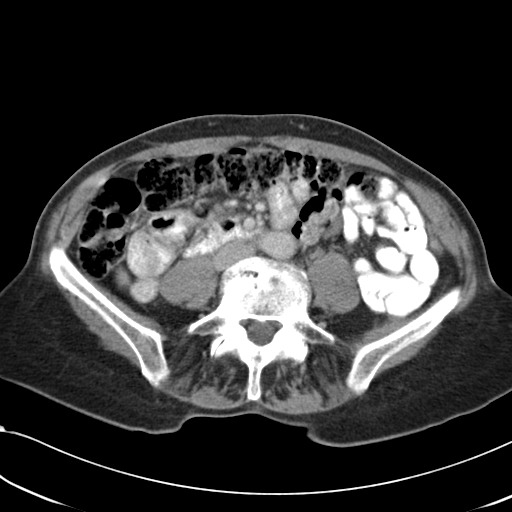
[im 45/81  soft-tissue]
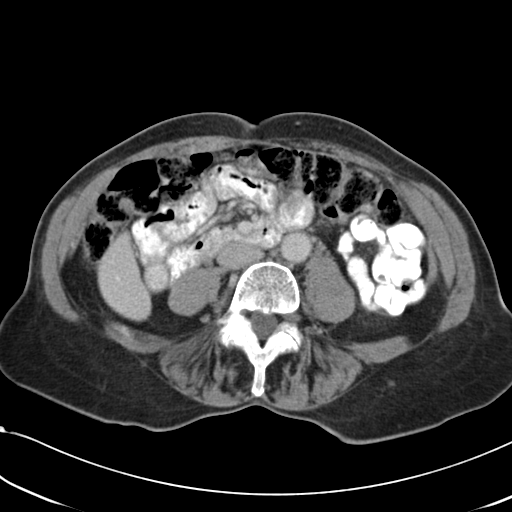
[im 53/81  soft-tissue]
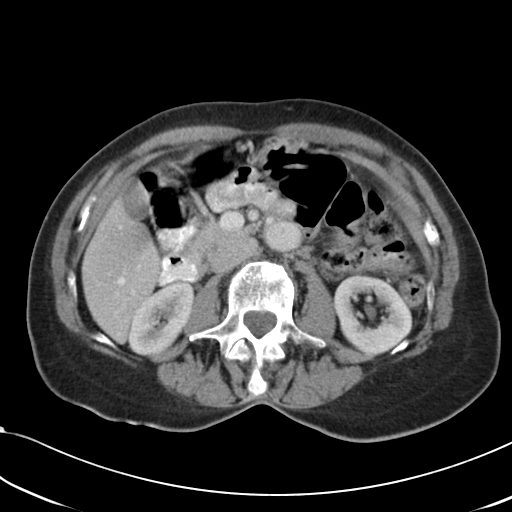
[im 53/81  bone]
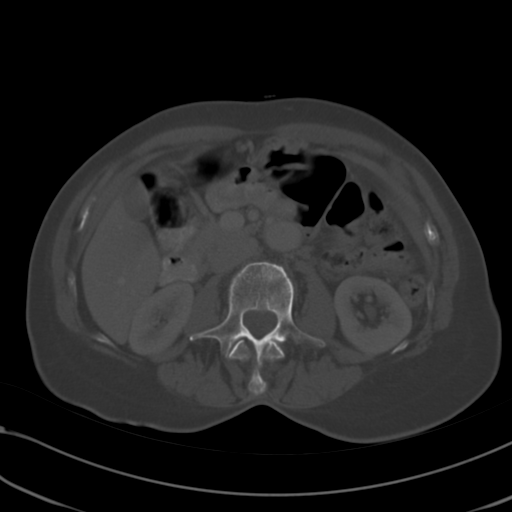
[im 57/81  soft-tissue]
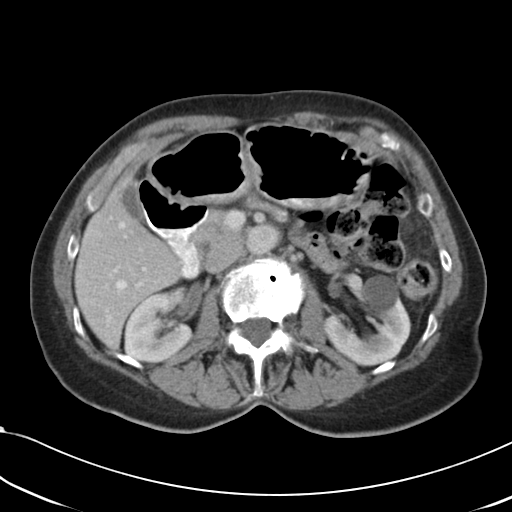
[im 65/81  soft-tissue]
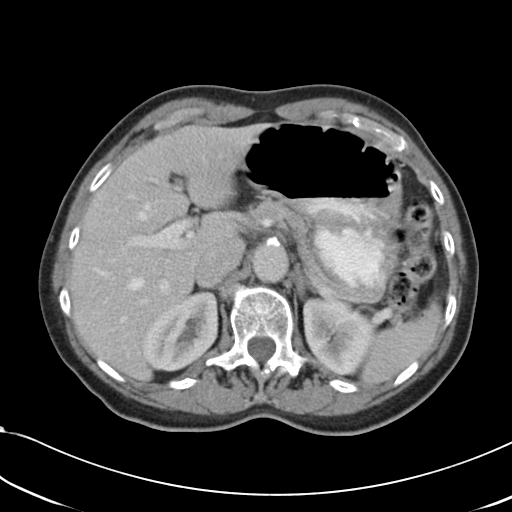
[im 69/81  soft-tissue]
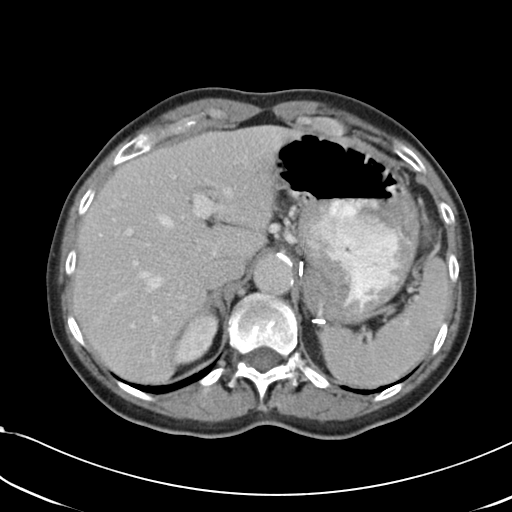
[im 77/81  soft-tissue]
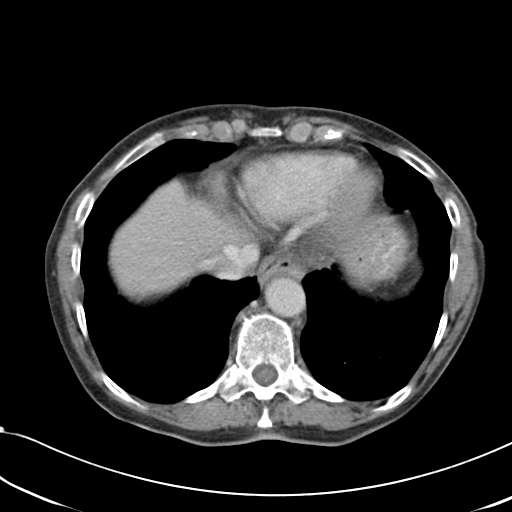

[Series 602: coronl abd · coronal · 0.82mm/px · 3 of 107 slices shown]
[im 36/107  soft-tissue]
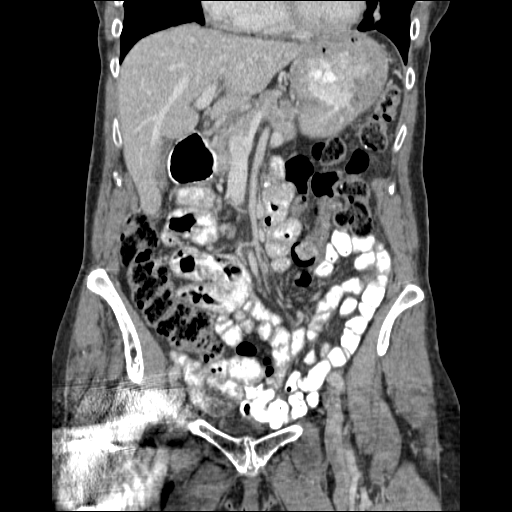
[im 48/107  soft-tissue]
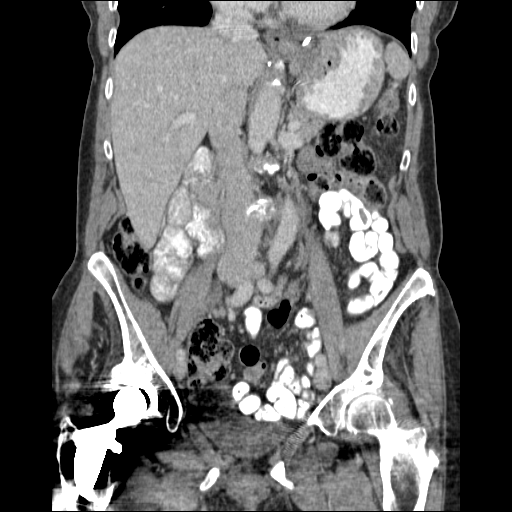
[im 59/107  soft-tissue]
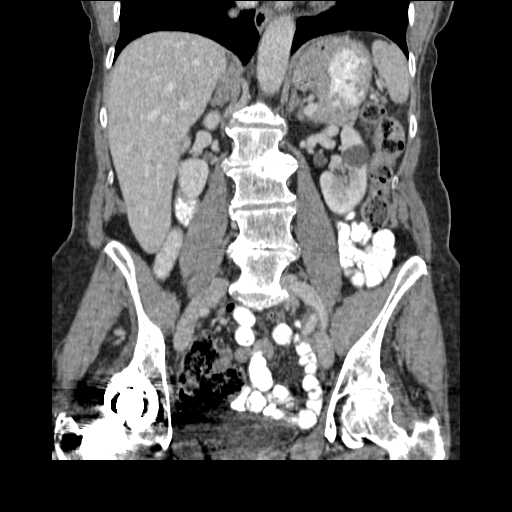

[16 of 46 positions shown; findings below may reference images not displayed]

FINDINGS: Lower chest: The lung bases are clear. No pleural or pericardial
effusion.

Hepatobiliary: 4 mm low density structure within segment 8 of the
liver is noted, image 7/ series 2. This is too small to
characterize. The gallbladder is normal. No biliary dilatation.

Pancreas: Normal appearance of the pancreas.

Spleen: The spleen is unremarkable.

Adrenals/Urinary Tract: The adrenal glands are both normal. Normal
appearance of the right kidney. There is a cyst within the left
kidney measuring 2.3 cm. No hydronephrosis identified. The urinary
bladder is unremarkable.

Stomach/Bowel: Postoperative changes involving the stomach
compatible with the clinical history of Nissen fundoplication. The
small bowel loops have a normal course and caliber. No obstruction.
The appendix is visualized and appears normal. Normal appearance of
the colon. There is a moderate stool burden identified within the
sigmoid colon and rectum.

Vascular/Lymphatic: Normal appearance of the abdominal aorta. No
enlarged retroperitoneal or mesenteric adenopathy. No enlarged
pelvic or inguinal lymph nodes.

Reproductive: The uterus and adnexal structures are unremarkable.

Other: There is no ascites or focal fluid collections within the
abdomen or pelvis.

Musculoskeletal: Degenerative disc disease noted within the lumbar
spine. This is most advanced at L2-3, L4-5 and L5-S1. No aggressive
lytic or sclerotic bone lesion. Previous right hip arthroplasty.
Moderate left hip osteoarthritis.
IMPRESSION: 1. No acute findings within the abdomen or pelvis.
2. Left kidney cyst.
3. Lumbar spondylosis.

## 2017-09-15 ENCOUNTER — Telehealth: Payer: Self-pay

## 2017-09-15 NOTE — Telephone Encounter (Signed)
Returned patient's call regarding question on how long she needs to continue with diagnostic mammograms.  Informed patient diagnostic mammograms are usually 7 years after lumpectomy.  Patient voiced understanding.  No further needs at this time.

## 2019-06-22 ENCOUNTER — Ambulatory Visit: Payer: Medicare PPO | Admitting: Physician Assistant

## 2019-06-22 ENCOUNTER — Encounter: Payer: Self-pay | Admitting: Physician Assistant

## 2019-06-22 VITALS — BP 138/80 | HR 96 | Temp 97.8°F | Ht 68.75 in | Wt 149.2 lb

## 2019-06-22 DIAGNOSIS — Z1211 Encounter for screening for malignant neoplasm of colon: Secondary | ICD-10-CM | POA: Diagnosis not present

## 2019-06-22 DIAGNOSIS — K2270 Barrett's esophagus without dysplasia: Secondary | ICD-10-CM

## 2019-06-22 DIAGNOSIS — K219 Gastro-esophageal reflux disease without esophagitis: Secondary | ICD-10-CM | POA: Diagnosis not present

## 2019-06-22 DIAGNOSIS — Z1212 Encounter for screening for malignant neoplasm of rectum: Secondary | ICD-10-CM | POA: Diagnosis not present

## 2019-06-22 MED ORDER — NA SULFATE-K SULFATE-MG SULF 17.5-3.13-1.6 GM/177ML PO SOLN: 1.0000 | Freq: Once | ORAL | 0 refills | Status: AC

## 2019-06-22 MED ORDER — PANTOPRAZOLE SODIUM 20 MG PO TBEC: 20.0000 mg | DELAYED_RELEASE_TABLET | Freq: Every day | ORAL | 3 refills | Status: DC

## 2019-06-22 NOTE — Progress Notes (Signed)
Chief Complaint: GERD  HPI:    Alison [name] is a 74 year old female with a past medical history as listed below, previously known to Dr. Olevia Perches, who was referred to me by Dion Saucier, Bernadene Bell, MD for a complaint of GERD.    03/25/2007 EGD showed status post Nissen fundoplication and status post passage of a 17 mm dilator.  Biopsies showed benign gastric type mucosa with minute focus of intestinal metaplasia consistent with Barrett's.  No dysplasia.  Colonoscopy at the same time was normal.  Repeat recommended in 10 years.    02/02/2012 EGD with irregular Z-line and history of Nissen.  Biopsy showed squamocolumnar mucosa with chronic inflammation and reactive changes consistent with esophagitis.  No intestinal metaplasia.  Repeat recommended in 5 years.    Today, the patient presents to clinic and tells me that she has had a burning in her esophagus for some months now, this is regardless of her Omeprazole 20 mg twice daily.  She will occasionally use Gaviscon which helps for a little while.  In addition to this, the patient has quit drinking coffee and decreased the amount of gin in her nightly drink, but this has not really changed much over the past month or so.  Patient also tells me that she will get a thick "clear glob of mucus" that she has to "hawk out" in the morning when she wakes up but also when she becomes nervous.    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  She tells me she was told that her intestine will always be twisted.    Patient is retired and enjoys pickleball.    Denies fever, chills, weight loss, blood in her stool or symptoms that awaken her from sleep.  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/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   Uterine polyp    Vertigo     Past Surgical History:  Procedure Laterality Date   BOWEL RESECTION  2017   BREAST BIOPSY Left 04/17/2013   Procedure: BREAST BIOPSY WITH NEEDLE LOCALIZATION;  Surgeon: Odis Hollingshead, MD;  Location: [facility];  Service: General;  Laterality: Left;   BREAST LUMPECTOMY WITH RADIOACTIVE SEED LOCALIZATION Left 07/12/2014   Procedure: LEFT BREAST LUMPECTOMY WITH RADIOACTIVE SEED LOCALIZATION;  Surgeon: Jackolyn Confer, MD;  Location: [facility];  Service: General;  Laterality: Left;   COLONOSCOPY     LAPAROSCOPIC BILATERAL SALPINGO OOPHERECTOMY Bilateral 03/19/2015   Procedure: LAPAROSCOPIC BILATERAL SALPINGO OOPHORECTOMY WITH EXTENSIVE LYSIS OF ABDOMINAL AND PELVIC ADHESIONS AND  PELVIC WASINGS;  Surgeon: Terrance Mass, MD;  Location: [facility];  Service: Gynecology;  Laterality: Bilateral;   NISSEN FUNDOPLICATION  123456   OVARIAN CYST REMOVAL  TONSILLECTOMY     TOTAL HIP ARTHROPLASTY Right 2005   UPPER GI ENDOSCOPY      Current Outpatient Medications  Medication Sig Dispense Refill   aspirin 81 MG tablet Take 81 mg by mouth daily.     Calcium Carb-Cholecalciferol (CALCIUM + D3 PO) Take 2 tablets by mouth daily.      meclizine (ANTIVERT) 25 MG tablet Take 1 tablet (25 mg total) by mouth 3 (three) times daily as needed for dizziness. 90 tablet 1   metoprolol tartrate (LOPRESSOR) 25 MG tablet Take 1 tablet (25 mg total) by mouth 2 (two) times daily. 180 tablet 3   Milk Thistle 1000 MG CAPS Take 2 tablets by mouth  daily.      Omega-3 Fatty Acids (FISH OIL) 1000 MG CAPS Take 3 capsules by mouth daily.      omeprazole (PRILOSEC) 20 MG capsule Take 20 mg by mouth 2 (two) times daily.     Probiotic Product (PROBIOTIC ACIDOPHILUS) CAPS Take 2 tablets by mouth daily.      SUMAtriptan (IMITREX) 20 MG/ACT nasal spray Place 1 spray (20 mg total) into the nose every 2 (two) hours as needed for migraine. 8 Inhaler 11   TURMERIC PO Take 2 tablets by mouth daily.      No current facility-administered medications for this visit.    Allergies as of 06/22/2019 - Review Complete 04/20/2016  Allergen Reaction Noted   Adhesive [tape]  04/11/2013   Morphine Rash 12/17/2009   Sulfonamide derivatives Rash 12/17/2009    Family History  Problem Relation Age of Onset   Heart attack Father    Lung cancer Mother 22       smoker   Breast cancer Maternal Aunt        half-aunt at 102    Social History   Socioeconomic History   Marital status: Married    Spouse name: Alison [name]   Number of children: 0   Years of education: 16   Highest education level: Not on file  Occupational History   Occupation: PE TEACHER     Employer: [employer]: RETIRED   Tobacco Use   Smoking status: Former Smoker    Years: 5.00    Types: Cigarettes    Quit date: 08/10/1977    Years since quitting: 41.8   Smokeless tobacco: Never Used   Tobacco comment: She smoked for 5 years in her early 65s.    Substance and Sexual Activity   Alcohol use: Yes    Alcohol/week: 0.0 standard drinks   Drug use: No   Sexual activity: Yes    Partners: Female  Other Topics Concern   Not on file  Social History Narrative   Marital Status:  Single Heritage manager)    Children:  None    Pets:  None   Living Situation: Lives with her significant other Therapist, art)    Occupation: Retired Advice worker)    Education: Forensic psychologist ([employer])    Tobacco Use/Exposure:  She smoked for 5 years in her early 63s.     Alcohol  Use:  Moderate   Drug Use:  None   Diet:  Regular   Exercise:  Active Life    Hobbies:  Fishing, Technical sales engineer, Golf             Social Determinants of Health   Financial Resource Strain:    Difficulty of Paying Living Expenses:   Food Insecurity:    Worried About Running Out of  Food in the Last Year:    Arboriculturist in the Last Year:   Transportation Needs:    Film/video editor (Medical):    Lack of Transportation (Non-Medical):   Physical Activity:    Days of Exercise per Week:    Minutes of Exercise per Session:   Stress:    Feeling of Stress :   Social Connections:    Frequency of Communication with Friends and Family:    Frequency of Social Gatherings with Friends and Family:    Attends Religious Services:    Active Member of Clubs or Organizations:    Attends Music therapist:    Marital Status:   Intimate Partner Violence:    Fear of Current or Ex-Partner:    Emotionally Abused:    Physically Abused:    Sexually Abused:     Review of Systems:    Constitutional: No weight loss, fever or chills Skin: No rash  Cardiovascular: No chest pain Respiratory: No SOB Gastrointestinal: See HPI and otherwise negative Genitourinary: No dysuria  Neurological: No headache, dizziness or syncope Musculoskeletal: No new muscle or joint pain Hematologic: No bleeding  Psychiatric: No history of depression or anxiety   Physical Exam:  Vital signs: BP 138/80 (BP Location: Left Arm, Patient Position: Sitting, Cuff Size: Normal)    Pulse 96    Temp 97.8 F (36.6 C) (Other (Comment))    Ht 5' 8.75" (1.746 m)    Wt 149 lb 4 oz (67.7 kg)    BMI 22.20 kg/m   Constitutional:   Pleasant Caucasian female appears to be in NAD, Well developed, Well nourished, alert and cooperative Head:  Normocephalic and atraumatic. Eyes:   PEERL, EOMI. No icterus. Conjunctiva pink. Ears:  Normal auditory acuity. Neck:  Supple Throat: Oral cavity and pharynx without  inflammation, swelling or lesion.  Respiratory: Respirations even and unlabored. Lungs clear to auscultation bilaterally.   No wheezes, crackles, or rhonchi.  Cardiovascular: Normal S1, S2. No MRG. Regular rate and rhythm. No peripheral edema, cyanosis or pallor.  Gastrointestinal:  Soft, nondistended, nontender. No rebound or guarding.  Increased bowel sounds all 4 quadrants.  No appreciable masses or hepatomegaly. Rectal:  Not performed.  Msk:  Symmetrical without gross deformities. Without edema, no deformity or joint abnormality.  Neurologic:  Alert and  oriented x4;  grossly normal neurologically.  Skin:   Dry and intact without significant lesions or rashes. Psychiatric: Demonstrates good judgement and reason without abnormal affect or behaviors.  No recent labs/imaging.  Assessment: 1.  GERD: With esophageal burning pain over the past few months; consider esophagitis with reflux+/-Barrett's 2.  History of Barrett's esophagus: Seen on EGD in 2009, repeat in 2016 with no sign of Barrett's 3.  Screening for colorectal cancer: Last colonoscopy in 2009 with recommendations to repeat in 10 years  Plan: 1.  Scheduled patient for diagnostic EGD and screening colonoscopy in the [place] with Dr. Havery Moros.  Did discuss risks, benefits, limitations and alternatives and the patient agrees to proceed. 2.  Patient complained that sometimes she gets reflux when drinking the bowel prep.  Told her that she can use Gaviscon as needed or Pepcid if this occurs. 3.  Stopped Omeprazole.  Prescribed Pantoprazole 20 mg twice daily, 30-60 minutes before breakfast and dinner #60 with 5 refills.  (Patient tells me she had reaction to Nexium in the past) 4.  Patient to follow in clinic per recommendations from Dr. Havery Moros after time of procedures.  Ellouise Newer,  PA-C [facility] 06/22/2019, 2:52 PM  Cc: Jonathon Resides, MD

## 2019-06-22 NOTE — Patient Instructions (Addendum)
If you are age 75 or older, your body mass index should be between 23-30. Your Body mass index is 22.2 kg/m. If this is out of the aforementioned range listed, please consider follow up with your Primary Care Provider.  If you are age 71 or younger, your body mass index should be between 19-25. Your Body mass index is 22.2 kg/m. If this is out of the aformentioned range listed, please consider follow up with your Primary Care Provider.   We have sent the following medications to your pharmacy for you to pick up at your convenience:  Pantoprazole 20mg  daily Suprep (Colonoscopy prep)  Due to recent changes in healthcare laws, you may see the results of your imaging and laboratory studies on MyChart before your provider has had a chance to review them.  We understand that in some cases there may be results that are confusing or concerning to you. Not all laboratory results come back in the same time frame and the provider may be waiting for multiple results in order to interpret others.  Please give [place] 48 hours in order for your provider to thoroughly review all the results before contacting the office for clarification of your results.   Thank you for choosing me and [facility]  Ellouise Newer PA-C

## 2019-06-23 ENCOUNTER — Telehealth: Payer: Self-pay | Admitting: Physician Assistant

## 2019-06-23 ENCOUNTER — Other Ambulatory Visit: Payer: Self-pay | Admitting: General Surgery

## 2019-06-23 MED ORDER — PANTOPRAZOLE SODIUM 20 MG PO TBEC: 20.0000 mg | DELAYED_RELEASE_TABLET | Freq: Two times a day (BID) | ORAL | 3 refills | Status: DC

## 2019-06-23 NOTE — Telephone Encounter (Signed)
Left a voicemail on [employer]. Cancelled pantoprazole 20mg  daily #30 and e-scribed rx for pantoprazole 20 mg bid #60

## 2019-06-23 NOTE — Telephone Encounter (Signed)
Notified the patient that I will contact Kathrine Haddock and change her pantoprazole 20mg  rx to BID. Patient verbalized understanding and was appreciative.

## 2019-06-23 NOTE — Progress Notes (Signed)
Agree with assessment and plan as outlined.  

## 2019-06-23 NOTE — Telephone Encounter (Signed)
Patient called states the Protonix sent yesterday is for once daily X 30 and she usually takes it for twice daily X 90

## 2019-08-02 ENCOUNTER — Encounter: Payer: Self-pay | Admitting: Gastroenterology

## 2019-08-08 ENCOUNTER — Encounter: Payer: Self-pay | Admitting: Gastroenterology

## 2019-08-08 ENCOUNTER — Ambulatory Visit (AMBULATORY_SURGERY_CENTER): Payer: Medicare PPO | Admitting: Gastroenterology

## 2019-08-08 ENCOUNTER — Other Ambulatory Visit: Payer: Self-pay

## 2019-08-08 VITALS — BP 117/82 | HR 65 | Temp 96.8°F | Resp 11 | Ht 68.0 in | Wt 149.0 lb

## 2019-08-08 DIAGNOSIS — D123 Benign neoplasm of transverse colon: Secondary | ICD-10-CM

## 2019-08-08 DIAGNOSIS — Z538 Procedure and treatment not carried out for other reasons: Secondary | ICD-10-CM

## 2019-08-08 DIAGNOSIS — K219 Gastro-esophageal reflux disease without esophagitis: Secondary | ICD-10-CM | POA: Diagnosis not present

## 2019-08-08 DIAGNOSIS — K449 Diaphragmatic hernia without obstruction or gangrene: Secondary | ICD-10-CM | POA: Diagnosis not present

## 2019-08-08 DIAGNOSIS — Z1211 Encounter for screening for malignant neoplasm of colon: Secondary | ICD-10-CM

## 2019-08-08 DIAGNOSIS — K317 Polyp of stomach and duodenum: Secondary | ICD-10-CM | POA: Diagnosis not present

## 2019-08-08 DIAGNOSIS — Z1212 Encounter for screening for malignant neoplasm of rectum: Secondary | ICD-10-CM

## 2019-08-08 HISTORY — PX: COLONOSCOPY: SHX174

## 2019-08-08 MED ORDER — PANTOPRAZOLE SODIUM 40 MG PO TBEC: 40.0000 mg | DELAYED_RELEASE_TABLET | Freq: Two times a day (BID) | ORAL | 0 refills | Status: DC

## 2019-08-08 MED ORDER — SODIUM CHLORIDE 0.9 % IV SOLN: 500.0000 mL | Freq: Once | INTRAVENOUS | Status: DC

## 2019-08-08 NOTE — Op Note (Signed)
[facility] Patient Name: Alison [name] Procedure Date: 08/08/2019 10:12 AM MRN: [id number] Endoscopist: Remo Lipps P. Havery Moros , MD Age: 74 Referring MD:  Date of Birth: Aug 21, 1945 Gender: Female Account #: [id number] Procedure:                Colonoscopy Indications:              Screening for colorectal malignant neoplasm Medicines:                Monitored Anesthesia Care Procedure:                Pre-Anesthesia Assessment:                           - Prior to the procedure, a History and Physical                            was performed, and patient medications and                            allergies were reviewed. The patient's tolerance of                            previous anesthesia was also reviewed. The risks                            and benefits of the procedure and the sedation                            options and risks were discussed with the patient.                            All questions were answered, and informed consent                            was obtained. Prior Anticoagulants: The patient has                            taken no previous anticoagulant or antiplatelet                            agents. ASA Grade Assessment: III - A patient with                            severe systemic disease. After reviewing the risks                            and benefits, the patient was deemed in                            satisfactory condition to undergo the procedure.                           After obtaining informed consent, the colonoscope  was passed under direct vision. Throughout the                            procedure, the patient's blood pressure, pulse, and                            oxygen saturations were monitored continuously. The                            Colonoscope was introduced through the anus with                            the intention of advancing to the cecum. The scope                            was  advanced to the transverse colon before the                            procedure was aborted. Medications were given. The                            colonoscopy was performed without difficulty. The                            patient tolerated the procedure well. The quality                            of the bowel preparation was poor. The rectum was                            photographed. Scope In: 10:33:54 AM Scope Out: 10:41:14 AM Total Procedure Duration: 0 hours 7 minutes 20 seconds  Findings:                 The perianal and digital rectal examinations were                            normal.                           A 3 mm polyp was found in the transverse colon. The                            polyp was sessile. The polyp was removed with a                            cold snare. Resection and retrieval were complete.                           A large amount of liquid stool was found in the                            entire colon, making visualization difficult. Prep  got progressively worse during cecal intubation and                            could not adequately see well enough to perform                            cecal intubation and to provide high quality                            screening exam, thus the procedure was aborted                            somewhere in the transverse colon.                           Internal hemorrhoids were found during                            retroflexion. The hemorrhoids were small. Complications:            No immediate complications. Estimated blood loss:                            Minimal. Estimated Blood Loss:     Estimated blood loss was minimal. Impression:               - Preparation of the colon was poor and prep was                            inadequate for screening purposes.                           - One 3 mm polyp in the transverse colon, removed                            with a cold snare.  Resected and retrieved.                           - Internal hemorrhoids. Recommendation:           - Patient has a contact number available for                            emergencies. The signs and symptoms of potential                            delayed complications were discussed with the                            patient. Return to normal activities tomorrow.                            Written discharge instructions were provided to the                            patient.                           -  Resume previous diet.                           - Continue present medications.                           - Await pathology results.                           - Repeat colonoscopy because the bowel preparation                            was suboptimal. To be scheduled at the patient's                            convenience, using double prep Remo Lipps P. Robertha Staples, MD 08/08/2019 10:45:41 AM This report has been signed electronically.

## 2019-08-08 NOTE — Op Note (Signed)
[facility] Patient Name: Alison [name] Procedure Date: 08/08/2019 10:12 AM MRN: [id number] Endoscopist: Remo Lipps P. Havery Moros , MD Age: 74 Referring MD:  Date of Birth: [birth year] Gender: Female Account #: [id number] Procedure:                Upper GI endoscopy Indications:              Follow-up of gastro-esophageal reflux disease -                            history of Nissen fundoplication, had persistent                            reflux symptoms despite omeprazole 20mg  twice                            daily, switched to pantoprazole 20mg  twice daily                            recently with good response and improvement of                            symptoms Medicines:                Monitored Anesthesia Care Procedure:                Pre-Anesthesia Assessment:                           - Prior to the procedure, a History and Physical                            was performed, and patient medications and                            allergies were reviewed. The patient's tolerance of                            previous anesthesia was also reviewed. The risks                            and benefits of the procedure and the sedation                            options and risks were discussed with the patient.                            All questions were answered, and informed consent                            was obtained. Prior Anticoagulants: The patient has                            taken no previous anticoagulant or antiplatelet  agents. ASA Grade Assessment: III - A patient with                            severe systemic disease. After reviewing the risks                            and benefits, the patient was deemed in                            satisfactory condition to undergo the procedure.                           After obtaining informed consent, the endoscope was                            passed under direct vision. Throughout the                          procedure, the patient's blood pressure, pulse, and                            oxygen saturations were monitored continuously. The                            Endoscope was introduced through the mouth, and                            advanced to the second part of duodenum. The upper                            GI endoscopy was accomplished without difficulty.                            The patient tolerated the procedure well. Scope In: Scope Out: Findings:                 Esophagogastric landmarks were identified: the                            Z-line was found at 38 cm, the gastroesophageal                            junction was found at 38 cm and the upper extent of                            the gastric folds was found at 40 cm from the                            incisors. 2cm hiatal hernia noted. Z line just                            slightly irregular but did not meet criteria for  Barrett's biopsies.                           LA Grade A esophagitis was found 38 cm from the                            incisors.                           The exam of the esophagus was otherwise normal.                           Evidence of a Nissen fundoplication was found in                            the cardia. The wrap was loose.                           Multiple small sessile polyps were found in the                            gastric fundus and in the gastric body, grossly                            consistent with benign fundic gland polyps. One                            representative polyp was removed with a cold biopsy                            forceps. Resection and retrieval were complete.                           The exam of the stomach was otherwise normal.                           The duodenal bulb and second portion of the                            duodenum were normal. Complications:            No immediate complications.  Estimated blood loss:                            Minimal. Estimated Blood Loss:     Estimated blood loss was minimal. Impression:               - Esophagogastric landmarks identified.                           - 2 cm hiatal hernia                           - LA Grade A reflux esophagitis.                           -  Slightly irregular z line but does not meet                            criteria for Barrett's esophagus.                           - Normal esophagus otherwise                           - A Nissen fundoplication was found, the wrap was                            loose.                           - Multiple gastric polyps. Suspect benign fundic                            gland polyps. One representative lesion resected                            and retrieved.                           - Normal stomach otherwise.                           - Normal duodenal bulb and second portion of the                            duodenum. Recommendation:           - Patient has a contact number available for                            emergencies. The signs and symptoms of potential                            delayed complications were discussed with the                            patient. Return to normal activities tomorrow.                            Written discharge instructions were provided to the                            patient.                           - Resume previous diet.                           - Continue present medications                           - Would increase protonix to 40mg  twice daily for a  few weeks to heal esophagitis, then decrease to                            lowest dose needed to control reflux symptoms.                           - Await pathology results. Remo Lipps P. Tiajah Oyster, MD 08/08/2019 10:52:36 AM This report has been signed electronically.

## 2019-08-08 NOTE — Progress Notes (Signed)
Patient states that she will cal;l when she is ready to reschedule.  Does not want to reschedule today.  Patient does not want suprep. Stated that it was too expensive. She thinks per our conversation that miralax would be cheaper. Will need another pre-visit.

## 2019-08-08 NOTE — Progress Notes (Signed)
Called to room to assist during endoscopic procedure.  Patient ID and intended procedure confirmed with present staff. Received instructions for my participation in the procedure from the performing physician.  

## 2019-08-08 NOTE — Progress Notes (Signed)
pt tolerated well. VSS. awake and to recovery. Report given to RN. Bite block inserted while awake with no complaints and removed with ease. Atraumatic.

## 2019-08-08 NOTE — Patient Instructions (Signed)
Please, read all of the handouts given to you by your recovery room nurse.  Thank-you for choosing [place] for your healthcare needs today.  Take your medication as ordered.  YOU HAD AN ENDOSCOPIC PROCEDURE TODAY AT [facility]:   Refer to the procedure report that was given to you for any specific questions about what was found during the examination.  If the procedure report does not answer your questions, please call your gastroenterologist to clarify.  If you requested that your care partner not be given the details of your procedure findings, then the procedure report has been included in a sealed envelope for you to review at your convenience later.  YOU SHOULD EXPECT: Some feelings of bloating in the abdomen. Passage of more gas than usual.  Walking can help get rid of the air that was put into your GI tract during the procedure and reduce the bloating. If you had a lower endoscopy (such as a colonoscopy or flexible sigmoidoscopy) you may notice spotting of blood in your stool or on the toilet paper. If you underwent a bowel prep for your procedure, you may not have a normal bowel movement for a few days.  Please Note:  You might notice some irritation and congestion in your nose or some drainage.  This is from the oxygen used during your procedure.  There is no need for concern and it should clear up in a day or so.  SYMPTOMS TO REPORT IMMEDIATELY:   Following lower endoscopy (colonoscopy or flexible sigmoidoscopy):  Excessive amounts of blood in the stool  Significant tenderness or worsening of abdominal pains  Swelling of the abdomen that is new, acute  Fever of 100F or higher   Following upper endoscopy (EGD)  Vomiting of blood or coffee ground material  New chest pain or pain under the shoulder blades  Painful or persistently difficult swallowing  New shortness of breath  Fever of 100F or higher  Black, tarry-looking stools  For urgent or emergent issues, a  gastroenterologist can be reached at any hour by calling [phone]. Do not use MyChart messaging for urgent concerns.    DIET:  We do recommend a small meal at first, but then you may proceed to your regular diet.  Drink plenty of fluids but you should avoid alcoholic beverages for 24 hours.  ACTIVITY:  You should plan to take it easy for the rest of today and you should NOT DRIVE or use heavy machinery until tomorrow (because of the sedation medicines used during the test).    FOLLOW UP: Our staff will call the number listed on your records 48-72 hours following your procedure to check on you and address any questions or concerns that you may have regarding the information given to you following your procedure. If we do not reach you, we will leave a message.  We will attempt to reach you two times.  During this call, we will ask if you have developed any symptoms of COVID 19. If you develop any symptoms (ie: fever, flu-like symptoms, shortness of breath, cough etc.) before then, please call [phone].  If you test positive for Covid 19 in the 2 weeks post procedure, please call and report this information to [place].    If any biopsies were taken you will be contacted by phone or by letter within the next 1-3 weeks.  Please call us at [phone] if you have not heard about the biopsies in 3 weeks.  SIGNATURES/CONFIDENTIALITY: You and/or your care partner have signed paperwork which will be entered into your electronic medical record.  These signatures attest to the fact that that the information above on your After Visit Summary has been reviewed and is understood.  Full responsibility of the confidentiality of this discharge information lies with you and/or your care-partner.

## 2019-08-08 NOTE — Progress Notes (Signed)
Temp check by:LC Vital check by:DT  The medical and surgical history was reviewed and verified with the patient. 

## 2019-08-10 ENCOUNTER — Telehealth: Payer: Self-pay

## 2019-08-10 NOTE — Telephone Encounter (Signed)
  Follow up Call-  Call back number 08/08/2019  Post procedure Call Back phone  # [phone]  Permission to leave phone message Yes  Some recent data might be hidden     Patient questions:  Do you have a fever, pain , or abdominal swelling? No. Pain Score  0 *  Have you tolerated food without any problems? Yes.    Have you been able to return to your normal activities? Yes.    Do you have any questions about your discharge instructions: Diet   No. Medications  No. Follow up visit  No.  Do you have questions or concerns about your Care? No.  Actions: * If pain score is 4 or above: No action needed, pain <4.  1. Have you developed a fever since your procedure? no  2.   Have you had an respiratory symptoms (SOB or cough) since your procedure?no 3.   Have you tested positive for COVID 19 since your procedure no  4.   Have you had any family members/close contacts diagnosed with the COVID 19 since your procedure?  no   If yes to any of these questions please route to Joylene John, RN and Erenest Rasher, RN

## 2019-08-24 ENCOUNTER — Other Ambulatory Visit: Payer: Self-pay

## 2019-08-24 ENCOUNTER — Ambulatory Visit (AMBULATORY_SURGERY_CENTER): Payer: Self-pay

## 2019-08-24 VITALS — Ht 68.75 in | Wt 145.0 lb

## 2019-08-24 DIAGNOSIS — Z86010 Personal history of colonic polyps: Secondary | ICD-10-CM

## 2019-08-24 MED ORDER — SUTAB 1479-225-188 MG PO TABS: 12.0000 | ORAL_TABLET | ORAL | 0 refills | Status: DC

## 2019-08-24 NOTE — Progress Notes (Signed)
No egg or soy allergy known to patient  No issues with past sedation with any surgeries  or procedures, no intubation problems  No diet pills per patient No home 02 use per patient  No blood thinners per patient   Pt has issues with constipation and had colon on 08/08/19 with a poor prep and procedure could not continue.    No A fib or A flutter  EMMI video sent to pt's e mail   Sutab sample given, 2 day prep with miralax  Due to the COVID-19 pandemic we are asking patients to follow these guidelines. Please only bring one care partner. Please be aware that your care partner may wait in the car in the parking lot or if they feel like they will be too hot to wait in the car, they may wait in the lobby on the 4th floor. All care partners are required to wear a mask the entire time (we do not have any that we can provide them), they need to practice social distancing, and we will do a Covid check for all patient's and care partners when you arrive. Also we will check their temperature and your temperature. If the care partner waits in their car they need to stay in the parking lot the entire time and we will call them on their cell phone when the patient is ready for discharge so they can bring the car to the front of the building. Also all patient's will need to wear a mask into building.

## 2019-08-29 ENCOUNTER — Encounter: Payer: Self-pay | Admitting: Gastroenterology

## 2019-09-04 ENCOUNTER — Encounter: Payer: Medicare PPO | Admitting: Gastroenterology

## 2019-09-06 ENCOUNTER — Encounter: Payer: Self-pay | Admitting: Gastroenterology

## 2019-09-06 ENCOUNTER — Ambulatory Visit (AMBULATORY_SURGERY_CENTER): Payer: Medicare PPO | Admitting: Gastroenterology

## 2019-09-06 ENCOUNTER — Telehealth: Payer: Self-pay | Admitting: Gastroenterology

## 2019-09-06 ENCOUNTER — Other Ambulatory Visit: Payer: Self-pay

## 2019-09-06 VITALS — BP 138/80 | HR 64 | Temp 97.7°F | Resp 11 | Ht 68.75 in | Wt 145.0 lb

## 2019-09-06 DIAGNOSIS — D120 Benign neoplasm of cecum: Secondary | ICD-10-CM

## 2019-09-06 DIAGNOSIS — Z86010 Personal history of colonic polyps: Secondary | ICD-10-CM

## 2019-09-06 DIAGNOSIS — D175 Benign lipomatous neoplasm of intra-abdominal organs: Secondary | ICD-10-CM

## 2019-09-06 MED ORDER — SODIUM CHLORIDE 0.9 % IV SOLN: 500.0000 mL | Freq: Once | INTRAVENOUS | Status: DC

## 2019-09-06 NOTE — Progress Notes (Signed)
Pt's states no medical or surgical changes since previsit or office visit.  CW - vitals 

## 2019-09-06 NOTE — Op Note (Signed)
[facility] Patient Name: Alison [name] Procedure Date: 09/06/2019 4:25 PM MRN: [id number] Endoscopist: Remo Lipps P. Havery Moros , MD Age: 74 Referring MD:  Date of Birth: 20-Jul-1945 Gender: Female Account #: [id number] Procedure:                Colonoscopy Indications:              High risk colon cancer surveillance: Personal                            history of colonic polyps - on 08/08/19 - one small                            adenoma but poor prep - 2 and a half preps used for                            this exam to clear Medicines:                Monitored Anesthesia Care Procedure:                Pre-Anesthesia Assessment:                           - Prior to the procedure, a History and Physical                            was performed, and patient medications and                            allergies were reviewed. The patient's tolerance of                            previous anesthesia was also reviewed. The risks                            and benefits of the procedure and the sedation                            options and risks were discussed with the patient.                            All questions were answered, and informed consent                            was obtained. Prior Anticoagulants: The patient has                            taken no previous anticoagulant or antiplatelet                            agents. ASA Grade Assessment: II - A patient with                            mild systemic disease. After reviewing the risks  and benefits, the patient was deemed in                            satisfactory condition to undergo the procedure.                           After obtaining informed consent, the colonoscope                            was passed under direct vision. Throughout the                            procedure, the patient's blood pressure, pulse, and                            oxygen saturations were monitored  continuously. The                            Colonoscope was introduced through the anus and                            advanced to the the cecum, identified by                            appendiceal orifice and ileocecal valve. The                            colonoscopy was performed without difficulty. The                            patient tolerated the procedure well. The quality                            of the bowel preparation was adequate. The                            ileocecal valve, appendiceal orifice, and rectum                            were photographed. Scope In: 4:37:31 PM Scope Out: 5:05:31 PM Scope Withdrawal Time: 0 hours 18 minutes 26 seconds  Total Procedure Duration: 0 hours 28 minutes 0 seconds  Findings:                 The perianal and digital rectal examinations were                            normal.                           Liquid stool was found in the entire colon, making                            visualization difficult. Lavage of the colon was  performed using copious amounts of sterile water,                            resulting in clearance with adequate visualization,                            this took several minutes.                           A 3 mm polyp was found in the ascending colon. The                            polyp was sessile. The polyp was removed with a                            cold snare. Resection and retrieval were complete.                           Two sessile polyps were found in the cecum. The                            polyps were 3 mm in size. These polyps were removed                            with a cold snare. Resection and retrieval were                            complete.                           There was a medium-sized lipoma, in the ascending                            colon and in the cecum.                           The colon was redundant and quite tortous.                            Internal hemorrhoids were found during                            retroflexion. The hemorrhoids were moderate.                           The exam was otherwise without abnormality. Complications:            No immediate complications. Estimated blood loss:                            Minimal. Estimated Blood Loss:     Estimated blood loss was minimal. Impression:               - Stool in the entire examined colon, lavaged with  adequate views.                           - One 3 mm polyp in the ascending colon, removed                            with a cold snare. Resected and retrieved.                           - Two 3 mm polyps in the cecum, removed with a cold                            snare. Resected and retrieved.                           - Medium-sized lipoma in the ascending colon and in                            the cecum.                           - Redundant / tortous colon.                           - Internal hemorrhoids.                           - The examination was otherwise normal. Recommendation:           - Patient has a contact number available for                            emergencies. The signs and symptoms of potential                            delayed complications were discussed with the                            patient. Return to normal activities tomorrow.                            Written discharge instructions were provided to the                            patient.                           - Resume previous diet.                           - Continue present medications.                           - Await pathology results. Remo Lipps P. Leyani Gargus, MD 09/06/2019 5:13:10 PM This report has been signed electronically.

## 2019-09-06 NOTE — Progress Notes (Signed)
A and O x3. Report to RN. Tolerated MAC anesthesia well.

## 2019-09-06 NOTE — Telephone Encounter (Signed)
Informed Dr. Havery Moros of pt. Concerns of not being clean out enough,she stated "I have followed the prep to a T she describe out put as being orange liquid with particles sticking to toilet",Dr. Instructed me to tell pt. To drink a couple of doses of miralax with juice over the next hour, pt. Instructed to take miralax with juice as many doses as possible over the next hour,she verbalize understanding.

## 2019-09-06 NOTE — Telephone Encounter (Signed)
Pt is scheduled for procedure this afternoon at 3:30pm with Dr. Havery Moros. She states that she is not fully cleansed yet and would like some advise. Pls call her.

## 2019-09-06 NOTE — Patient Instructions (Signed)
Please read handouts provided. Continue present medications. Await pathology results.   YOU HAD AN ENDOSCOPIC PROCEDURE TODAY AT THE [facility]:   Refer to the procedure report that was given to you for any specific questions about what was found during the examination.  If the procedure report does not answer your questions, please call your gastroenterologist to clarify.  If you requested that your care partner not be given the details of your procedure findings, then the procedure report has been included in a sealed envelope for you to review at your convenience later.  YOU SHOULD EXPECT: Some feelings of bloating in the abdomen. Passage of more gas than usual.  Walking can help get rid of the air that was put into your GI tract during the procedure and reduce the bloating. If you had a lower endoscopy (such as a colonoscopy or flexible sigmoidoscopy) you may notice spotting of blood in your stool or on the toilet paper. If you underwent a bowel prep for your procedure, you may not have a normal bowel movement for a few days.  Please Note:  You might notice some irritation and congestion in your nose or some drainage.  This is from the oxygen used during your procedure.  There is no need for concern and it should clear up in a day or so.  SYMPTOMS TO REPORT IMMEDIATELY:  Following lower endoscopy (colonoscopy or flexible sigmoidoscopy):  Excessive amounts of blood in the stool  Significant tenderness or worsening of abdominal pains  Swelling of the abdomen that is new, acute  Fever of 100F or higher   For urgent or emergent issues, a gastroenterologist can be reached at any hour by calling [phone]. Do not use MyChart messaging for urgent concerns.    DIET:  We do recommend a small meal at first, but then you may proceed to your regular diet.  Drink plenty of fluids but you should avoid alcoholic beverages for 24 hours.  ACTIVITY:  You should plan to take it easy  for the rest of today and you should NOT DRIVE or use heavy machinery until tomorrow (because of the sedation medicines used during the test).    FOLLOW UP: Our staff will call the number listed on your records 48-72 hours following your procedure to check on you and address any questions or concerns that you may have regarding the information given to you following your procedure. If we do not reach you, we will leave a message.  We will attempt to reach you two times.  During this call, we will ask if you have developed any symptoms of COVID 19. If you develop any symptoms (ie: fever, flu-like symptoms, shortness of breath, cough etc.) before then, please call [phone].  If you test positive for Covid 19 in the 2 weeks post procedure, please call and report this information to us.    If any biopsies were taken you will be contacted by phone or by letter within the next 1-3 weeks.  Please call us at [phone] if you have not heard about the biopsies in 3 weeks.    SIGNATURES/CONFIDENTIALITY: You and/or your care partner have signed paperwork which will be entered into your electronic medical record.  These signatures attest to the fact that that the information above on your After Visit Summary has been reviewed and is understood.  Full responsibility of the confidentiality of this discharge information lies with you and/or your care-partner.  

## 2019-09-06 NOTE — Progress Notes (Signed)
Called to room to assist during endoscopic procedure.  Patient ID and intended procedure confirmed with present staff. Received instructions for my participation in the procedure from the performing physician.  

## 2019-09-08 ENCOUNTER — Telehealth: Payer: Self-pay

## 2019-09-08 NOTE — Telephone Encounter (Signed)
  Follow up Call-  Call back number 09/06/2019 08/08/2019  Post procedure Call Back phone  # [phone]  Permission to leave phone message Yes Yes  Some recent data might be hidden     Patient questions:  Do you have a fever, pain , or abdominal swelling? No. Pain Score  0 *  Have you tolerated food without any problems? Yes.    Have you been able to return to your normal activities? Yes.    Do you have any questions about your discharge instructions: Diet   No. Medications  No. Follow up visit  No.  Do you have questions or concerns about your Care? No.  Actions: * If pain score is 4 or above: No action needed, pain <4. 1. Have you developed a fever since your procedure? no  2.   Have you had an respiratory symptoms (SOB or cough) since your procedure? no  3.   Have you tested positive for COVID 19 since your procedure no  4.   Have you had any family members/close contacts diagnosed with the COVID 19 since your procedure?  no   If yes to any of these questions please route to Joylene John, RN and Erenest Rasher, RN

## 2019-09-15 ENCOUNTER — Other Ambulatory Visit: Payer: Self-pay | Admitting: Physician Assistant

## 2023-01-27 ENCOUNTER — Other Ambulatory Visit: Payer: Self-pay

## 2023-01-28 LAB — SURGICAL PATHOLOGY

## 2023-02-01 ENCOUNTER — Encounter: Payer: Self-pay | Admitting: *Deleted

## 2023-02-01 DIAGNOSIS — Z170 Estrogen receptor positive status [ER+]: Secondary | ICD-10-CM | POA: Insufficient documentation

## 2023-02-01 DIAGNOSIS — C50512 Malignant neoplasm of lower-outer quadrant of left female breast: Secondary | ICD-10-CM | POA: Insufficient documentation

## 2023-02-03 ENCOUNTER — Encounter: Payer: Self-pay | Admitting: Genetic Counselor

## 2023-02-03 ENCOUNTER — Encounter: Payer: Self-pay | Admitting: Hematology and Oncology

## 2023-02-03 ENCOUNTER — Ambulatory Visit: Payer: Self-pay | Admitting: Surgery

## 2023-02-03 ENCOUNTER — Inpatient Hospital Stay: Payer: Medicare PPO | Attending: Hematology and Oncology

## 2023-02-03 ENCOUNTER — Inpatient Hospital Stay: Payer: Medicare PPO | Admitting: Licensed Clinical Social Worker

## 2023-02-03 ENCOUNTER — Ambulatory Visit: Payer: Medicare PPO | Attending: Surgery | Admitting: Physical Therapy

## 2023-02-03 ENCOUNTER — Ambulatory Visit
Admission: RE | Admit: 2023-02-03 | Discharge: 2023-02-03 | Disposition: A | Discharge status: Home / Self Care | Payer: Medicare PPO | Source: Ambulatory Visit | Attending: Radiation Oncology | Admitting: Radiation Oncology

## 2023-02-03 ENCOUNTER — Inpatient Hospital Stay: Payer: Medicare PPO | Admitting: Genetic Counselor

## 2023-02-03 ENCOUNTER — Other Ambulatory Visit: Payer: Self-pay

## 2023-02-03 ENCOUNTER — Inpatient Hospital Stay: Payer: Medicare PPO | Admitting: Hematology and Oncology

## 2023-02-03 ENCOUNTER — Encounter: Payer: Self-pay | Admitting: Physical Therapy

## 2023-02-03 VITALS — BP 137/86 | HR 83 | Temp 97.6°F | Resp 16 | Wt 151.2 lb

## 2023-02-03 DIAGNOSIS — Z7982 Long term (current) use of aspirin: Secondary | ICD-10-CM | POA: Diagnosis not present

## 2023-02-03 DIAGNOSIS — R293 Abnormal posture: Secondary | ICD-10-CM | POA: Diagnosis not present

## 2023-02-03 DIAGNOSIS — M25612 Stiffness of left shoulder, not elsewhere classified: Secondary | ICD-10-CM | POA: Insufficient documentation

## 2023-02-03 DIAGNOSIS — Z79899 Other long term (current) drug therapy: Secondary | ICD-10-CM | POA: Diagnosis not present

## 2023-02-03 DIAGNOSIS — C50512 Malignant neoplasm of lower-outer quadrant of left female breast: Secondary | ICD-10-CM

## 2023-02-03 DIAGNOSIS — Z170 Estrogen receptor positive status [ER+]: Secondary | ICD-10-CM

## 2023-02-03 DIAGNOSIS — Z87891 Personal history of nicotine dependence: Secondary | ICD-10-CM | POA: Insufficient documentation

## 2023-02-03 DIAGNOSIS — M1990 Unspecified osteoarthritis, unspecified site: Secondary | ICD-10-CM | POA: Insufficient documentation

## 2023-02-03 DIAGNOSIS — Z853 Personal history of malignant neoplasm of breast: Secondary | ICD-10-CM

## 2023-02-03 DIAGNOSIS — Z803 Family history of malignant neoplasm of breast: Secondary | ICD-10-CM | POA: Insufficient documentation

## 2023-02-03 DIAGNOSIS — K635 Polyp of colon: Secondary | ICD-10-CM

## 2023-02-03 HISTORY — DX: Personal history of malignant neoplasm of breast: Z85.3

## 2023-02-03 HISTORY — DX: Polyp of colon: K63.5

## 2023-02-03 LAB — CMP (CANCER CENTER ONLY)
ALT: 14 U/L (ref 0–44)
AST: 16 U/L (ref 15–41)
Albumin: 4.4 g/dL (ref 3.5–5.0)
Alkaline Phosphatase: 88 U/L (ref 38–126)
Anion gap: 7 (ref 5–15)
BUN: 17 mg/dL (ref 8–23)
CO2: 29 mmol/L (ref 22–32)
Calcium: 9.6 mg/dL (ref 8.9–10.3)
Chloride: 105 mmol/L (ref 98–111)
Creatinine: 0.75 mg/dL (ref 0.44–1.00)
GFR, Estimated: >60 mL/min (ref >60)
Glucose, Bld: 135 mg/dL — ABNORMAL HIGH (ref 70–99)
Potassium: 4.3 mmol/L (ref 3.5–5.1)
Sodium: 141 mmol/L (ref 135–145)
Total Bilirubin: 0.8 mg/dL (ref <1.2)
Total Protein: 7 g/dL (ref 6.5–8.1)

## 2023-02-03 LAB — CBC WITH DIFFERENTIAL (CANCER CENTER ONLY)
Abs Immature Granulocytes: 0.05 10*3/uL (ref 0.00–0.07)
Basophils Absolute: 0 10*3/uL (ref 0.0–0.1)
Basophils Relative: 1 %
Eosinophils Absolute: 0.1 10*3/uL (ref 0.0–0.5)
Eosinophils Relative: 2 %
HCT: 41.6 % (ref 36.0–46.0)
Hemoglobin: 13.8 g/dL (ref 12.0–15.0)
Immature Granulocytes: 1 %
Lymphocytes Relative: 28 %
Lymphs Abs: 1.6 10*3/uL (ref 0.7–4.0)
MCH: 30.2 pg (ref 26.0–34.0)
MCHC: 33.2 g/dL (ref 30.0–36.0)
MCV: 91 fL (ref 80.0–100.0)
Monocytes Absolute: 0.4 10*3/uL (ref 0.1–1.0)
Monocytes Relative: 7 %
Neutro Abs: 3.5 10*3/uL (ref 1.7–7.7)
Neutrophils Relative %: 61 %
Platelet Count: 310 10*3/uL (ref 150–400)
RBC: 4.57 MIL/uL (ref 3.87–5.11)
RDW: 12.9 % (ref 11.5–15.5)
WBC Count: 5.6 10*3/uL (ref 4.0–10.5)
nRBC: 0 % (ref 0.0–0.2)

## 2023-02-03 LAB — GENETIC SCREENING ORDER

## 2023-02-03 NOTE — Progress Notes (Signed)
[facility] Clinical Social Work  Initial Assessment   Alison [name] is a 77 y.o. year old female accompanied by spouse, Lavonna Rua. Clinical Social Work was referred by  [facility]  for assessment of psychosocial needs.   SDOH (Social Determinants of Health) assessments performed: Yes SDOH Interventions    Flowsheet Row Clinical Support from 02/03/2023 in [facility] [place]  SDOH Interventions   Food Insecurity Interventions Intervention Not Indicated  Housing Interventions Intervention Not Indicated  Transportation Interventions Intervention Not Indicated  Utilities Interventions Intervention Not Indicated       SDOH Screenings   Food Insecurity: No Food Insecurity (02/03/2023)  Housing: Low Risk  (02/03/2023)  Transportation Needs: No Transportation Needs (02/03/2023)  Utilities: Not At Risk (02/03/2023)  Depression (PHQ2-9): Low Risk  (02/03/2023)  Financial Resource Strain: Low Risk  (05/25/2022)   Received from [facility]  Physical Activity: Insufficiently Active (05/25/2022)   Received from [facility]  Social Connections: Socially Integrated (05/25/2022)   Received from [facility]  Stress: No Stress Concern Present (05/25/2022)   Received from [facility]  Tobacco Use: Medium Risk (02/03/2023)     Distress Screen completed: No     No data to display            Family/Social Information:  Housing Arrangement: patient lives with spouse, Lavonna Rua Family members/support persons in your life? Spouse and four very close friends. Has other extended family and friends but not planning to tell them if possible Transportation concerns: no  Employment: Retired .  Income source: Actor concerns: No Type of concern: None Food access concerns: no Religious or spiritual practice: Not known Services Currently in place:  [facility]  Coping/ Adjustment to diagnosis: Patient understands treatment plan  and what happens next? yes, has had breast cancer in the past and is ready to have a mastectomy now. Concerns about diagnosis and/or treatment:  Does not want to be forced into sharing her diagnosis with anyone she does not want to tell Current coping skills/ strengths: Ability for insight , Capable of independent living , Communication skills , Motivation for treatment/growth , and Supportive family/friends     SUMMARY: Current SDOH Barriers:  No major barriers noted today  Clinical Social Work Clinical Goal(s):  No clinical social work goals at this time  Interventions: Discussed common feeling and emotions when being diagnosed with cancer, and the importance of support during treatment Informed patient of the support team roles and support services at [facility] Provided CSW contact information and encouraged patient to call with any questions or concerns   Follow Up Plan: Patient will contact CSW with any support or resource needs Patient verbalizes understanding of plan: Yes    Kuulei Kleier E Soyla Bainter, LCSW Clinical Social Worker [facility]

## 2023-02-03 NOTE — Research (Signed)
[employer] 2021-05 - Specimen Collection Study to Evaluate Biomarkers in Subjects with Cancer   Medical History:  High Blood Pressure  Yes Coronary Artery Disease No Lupus    No Rheumatoid Arthritis  No Diabetes   No      Lynch Syndrome  No  Is the patient currently taking a magnesium supplement?   Yes If yes, dose and frequency? Patient unsure  Indication? To enhance calcium Start date? 11/08/2022  Does the patient have a personal history of cancer (greater than 5 years ago)?  Yes If yes, Cancer type and date of diagnosis?   Breast/DCIS left breast  Has this previous diagnosis been treated? yes      If so, treatment type? Radiation/lumpectomy   Start and end dates of last treatment cycle? 2015; dates unsure  Does the patient have a family history of cancer in 1st or 2nd degree relatives? Yes If yes, Relationship(s) and Cancer type(s)? Aunt: breast; mom: lung  Does the patient have history of alcohol consumption? Yes   If yes, current or former? current Number of years? 25 Drinks per week? 7  Does the patient have history of cigarette, cigar, pipe, or chewing tobacco use?  Yes  If yes, current for former? former If yes, type (Cigarette, cigar, pipe, and/or chewing tobacco)? cigarettes   If former, year stopped? 1974 Number of years? 5 Packs/number/containers per day? 0.5  Oryn Casanova G. Wynne Rozak, RN, BSN, [facility]  Her  Hers Clinical Research Nurse [facility] Direct Dial [phone] 02/03/2023 2:25 PM

## 2023-02-03 NOTE — Therapy (Signed)
OUTPATIENT PHYSICAL THERAPY BREAST CANCER BASELINE EVALUATION   Patient Name: Alison [name] MRN: [id number] DOB:01/29/1946, 77 y.o., female Today's Date: 02/03/2023  END OF SESSION:  PT End of Session - 02/03/23 1038     Visit Number 1    Number of Visits 2    Date for PT Re-Evaluation 03/31/23    PT Start Time 0933    PT Stop Time 0958   Also saw pt from 1017-1025 for a total of 33 min   PT Time Calculation (min) 25 min    Activity Tolerance Patient tolerated treatment well    Behavior During Therapy [facility] for tasks assessed/performed             Past Medical History:  Diagnosis Date   Adenomatous colon polyp    Arthritis    Barrett esophagus 03/25/07   Breast cancer (HCC) 03/2013   Left w/lumpectomy   Contact lens/glasses fitting    wears contacts or glasses   Dysrhythmia    irregular heart beat   Esophageal stricture    Esophagitis    GERD (gastroesophageal reflux disease)    H/O adenomatous polyp of colon    Hiatal hernia    Internal hemorrhoids    Macular degeneration    Left eye   Migraine headache    PONV (postoperative nausea and vomiting)    Radiation 05/16/13-06/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   Uterine polyp    Vertigo    Past Surgical History:  Procedure Laterality Date   BOWEL RESECTION  2017   BREAST BIOPSY Left 04/17/2013   Procedure: BREAST BIOPSY WITH NEEDLE LOCALIZATION;  Surgeon: Adolph Pollack, MD;  Location: [facility];  Service: General;  Laterality: Left;   BREAST LUMPECTOMY WITH RADIOACTIVE SEED LOCALIZATION Left 07/12/2014   Procedure: LEFT BREAST LUMPECTOMY WITH RADIOACTIVE SEED LOCALIZATION;  Surgeon: Avel Peace, MD;  Location: [facility];  Service: General;  Laterality: Left;   COLONOSCOPY  08/08/2019   poor prep, repeat colon 6/30   LAPAROSCOPIC BILATERAL SALPINGO OOPHERECTOMY Bilateral 03/19/2015   Procedure: LAPAROSCOPIC BILATERAL SALPINGO OOPHORECTOMY WITH EXTENSIVE LYSIS OF ABDOMINAL AND PELVIC  ADHESIONS AND  PELVIC Romona Curls;  Surgeon: Ok Edwards, MD;  Location: [facility];  Service: Gynecology;  Laterality: Bilateral;   NISSEN FUNDOPLICATION  2006   OVARIAN CYST REMOVAL     TONSILLECTOMY     TOTAL HIP ARTHROPLASTY Right 2005   UPPER GI ENDOSCOPY     Patient Active Problem List   Diagnosis Date Noted   Malignant neoplasm of lower-outer quadrant of left breast of female, estrogen receptor positive (HCC) 02/01/2023   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   Increased endometrial stripe thickness 09/05/2014   Postmenopausal bleeding 09/05/2014   Benign paroxysmal positional vertigo 10/22/2013   Essential hypertension, benign 10/22/2013   Breast cancer of lower-inner quadrant of left female breast (HCC) 07/27/2013   Breast calcifications on mammogram-left 04/10/2013   GERD (gastroesophageal reflux disease) 08/10/2012   Migraine headache 08/10/2012   Dizziness and giddiness 08/10/2012   Routine general medical examination at a health care facility 08/10/2012   Palpitations 08/10/2012   INTERNAL HEMORRHOIDS 12/17/2009   ESOPHAGEAL STRICTURE 12/17/2009   GERD 12/17/2009   BARRETTS ESOPHAGUS 12/17/2009   HIATAL HERNIA 12/17/2009   History of colonic polyps 12/17/2009    REFERRING PROVIDER: Dr. Harriette Bouillon  REFERRING DIAG: Left breast cancer  THERAPY DIAG:  Malignant neoplasm of lower-outer quadrant of left breast of female,  estrogen receptor positive (HCC)  Abnormal posture  Stiffness of left shoulder, not elsewhere classified  Rationale for Evaluation and Treatment: Rehabilitation  ONSET DATE: 01/27/2023  SUBJECTIVE:                                                                                                                                                                                           SUBJECTIVE STATEMENT: Patient reports she is here today to be seen by her medical team for her newly diagnosed left breast cancer.   PERTINENT  HISTORY:  Patient was diagnosed on 01/27/2023 with left grade 2 invasive ductal carcinoma breast cancer. It measures 2 cm  with calcifications, measuring a total of 3.8 cm and is located in the lower outer quadrant. It is ER/PR positive and HER2 negative with a Ki67 of 30%. She has a history of left breast DCIS treated in 2015 with a lumpectomy and radiation. She also had a left total knee replacement in 07/2022 and a right reverse shoulder replacement in 12/2021. She has extensive left shoulder arthritis and needs a left shoulder replacement which is now on hold until after cancer treatment.   PATIENT GOALS:   reduce lymphedema risk and learn post op HEP.   PAIN:  Are you having pain? No - reports some chronic low back pain and left shoulder pain with trying to reach but no pain at rest.  PRECAUTIONS: Active CA   RED FLAGS: None   HAND DOMINANCE: right  WEIGHT BEARING RESTRICTIONS: No  FALLS:  Has patient fallen in last 6 months? No  LIVING ENVIRONMENT: Patient lives with: her partner Lavonna Rua Lives in: House/apartment Has following equipment at home: None  OCCUPATION: Retired Runner, broadcasting/film/video  LEISURE: She plays pickleball 3xweek and does yardwork 3x/week  PRIOR LEVEL OF FUNCTION: Independent   OBJECTIVE: Note: Objective measures were completed at Evaluation unless otherwise noted.  COGNITION: Overall cognitive status: Within functional limits for tasks assessed    POSTURE:  Forward head and rounded shoulders posture  UPPER EXTREMITY AROM/PROM:  A/PROM RIGHT   eval   Shoulder extension 44  Shoulder flexion 126  Shoulder abduction 116  Shoulder internal rotation 55  Shoulder external rotation 82    (Blank rows = not tested)  A/PROM LEFT   eval  Shoulder extension 42  Shoulder flexion 103 painful  Shoulder abduction 89 painful  Shoulder internal rotation Not tested  Shoulder external rotation Not tested    (Blank rows = not tested)  CERVICAL AROM: All within normal  limits:    Percent limited  Flexion WNL  Extension WNL  Right lateral flexion 25% limited  Left lateral flexion  25% limited  Right rotation WNL  Left rotation WNL    UPPER EXTREMITY STRENGTH: WNL  LYMPHEDEMA ASSESSMENTS (in cm):   LANDMARK RIGHT   eval  10 cm proximal to olecranon process 25.6  Olecranon process 23.2  10 cm proximal to ulnar styloid process 20.4  Just proximal to ulnar styloid process 14.7  Across hand at thumb web space 19  At base of 2nd digit 6.3  (Blank rows = not tested)  LANDMARK LEFT   eval  10 cm proximal to olecranon process 26  Olecranon process 23.7  10 cm proximal to ulnar styloid process 19.6  Just proximal to ulnar styloid process 14  Across hand at thumb web space 18.8  At base of 2nd digit 6.4  (Blank rows = not tested)  L-DEX LYMPHEDEMA SCREENING:  The patient was assessed using the L-Dex machine today to produce a lymphedema index baseline score. The patient will be reassessed on a regular basis (typically every 3 months) to obtain new L-Dex scores. If the score is > 6.5 points away from his/her baseline score indicating onset of subclinical lymphedema, it will be recommended to wear a compression garment for 4 weeks, 12 hours per day and then be reassessed. If the score continues to be > 6.5 points from baseline at reassessment, we will initiate lymphedema treatment. Assessing in this manner has a 95% rate of preventing clinically significant lymphedema.   L-DEX FLOWSHEETS - 02/03/23 1000       L-DEX LYMPHEDEMA SCREENING   Measurement Type Unilateral    L-DEX MEASUREMENT EXTREMITY Upper Extremity    POSITION  Standing    DOMINANT SIDE Right    At Risk Side Left    BASELINE SCORE (UNILATERAL) -0.4             QUICK DASH SURVEY:  Neldon Mc - 02/03/23 0001     Open a tight or new jar Mild difficulty    Do heavy household chores (wash walls, wash floors) No difficulty    Carry a shopping bag or briefcase No difficulty     Wash your back Unable    Use a knife to cut food No difficulty    Recreational activities in which you take some force or impact through your arm, shoulder, or hand (golf, hammering, tennis) No difficulty    During the past week, to what extent has your arm, shoulder or hand problem interfered with your normal social activities with family, friends, neighbors, or groups? Not at all    During the past week, to what extent has your arm, shoulder or hand problem limited your work or other regular daily activities Not at all    Arm, shoulder, or hand pain. Moderate    Tingling (pins and needles) in your arm, shoulder, or hand None    Difficulty Sleeping No difficulty    DASH Score 15.91 %              PATIENT EDUCATION:  Education details: Lymphedema risk reduction and post op shoulder/posture HEP Person educated: Patient Education method: Explanation, Demonstration, Handout Education comprehension: Patient verbalized understanding and returned demonstration  HOME EXERCISE PROGRAM: Patient was instructed today in a home exercise program today for post op shoulder range of motion. These included active assist shoulder flexion in sitting, scapular retraction, wall walking with shoulder abduction, and hands behind head external rotation.  She was encouraged to do these twice a day, holding 3 seconds and repeating 5 times when permitted by her physician.  ASSESSMENT:  CLINICAL IMPRESSION: Patient was diagnosed on 01/27/2023 with left grade 2 invasive ductal carcinoma breast cancer. It measures 2 cm  with calcifications, measuring a total of 3.8 cm and is located in the lower outer quadrant. It is ER/PR positive and HER2 negative with a Ki67 of 30%. She has a history of left breast DCIS treated in 2015 with a lumpectomy and radiation. She also had a left total knee replacement in 07/2022 and a right reverse shoulder replacement in 12/2021. She has extensive left shoulder arthritis and needs a  left shoulder replacement which is now on hold until after cancer treatment. Her multidisciplinary medical team met prior to her assessments to determine a recommended treatment plan. She is planning to have a left mastectomy with a possible sentinel node biopsy depending on findings during surgery. Possible Oncotype test and anti-estrogen therapy to follow surgery. She will benefit from a post op PT reassessment to determine needs and from L-Dex screens every 3 months for 2 years to detect subclinical lymphedema.  Pt will benefit from skilled therapeutic intervention to improve on the following deficits: Decreased knowledge of precautions, impaired UE functional use, pain, decreased ROM, postural dysfunction.   PT treatment/interventions: ADL/self-care home management, pt/family education, therapeutic exercise  REHAB POTENTIAL: Excellent  CLINICAL DECISION MAKING: Stable/uncomplicated  EVALUATION COMPLEXITY: Low   GOALS: Goals reviewed with patient? YES  LONG TERM GOALS: (STG=LTG)    Name Target Date Goal status  1 Pt will be able to verbalize understanding of pertinent lymphedema risk reduction practices relevant to her dx specifically related to skin care.  Baseline:  No knowledge 02/03/2023 Achieved at eval  2 Pt will be able to return demo and/or verbalize understanding of the post op HEP related to regaining shoulder ROM. Baseline:  No knowledge 02/03/2023 Achieved at eval  3 Pt will be able to verbalize understanding of the importance of attending the post op After Breast CA Class for further lymphedema risk reduction education and therapeutic exercise.  Baseline:  No knowledge 02/03/2023 Achieved at eval  4 Pt will demo she has regained full shoulder ROM and function post operatively compared to baselines.  Baseline: See objective measurements taken today. 03/31/2023     PLAN:  PT FREQUENCY/DURATION: EVAL and 1 follow up appointment.   PLAN FOR NEXT SESSION: will reassess 3-4  weeks post op to determine needs.   Patient will follow up at outpatient cancer rehab 3-4 weeks following surgery.  If the patient requires physical therapy at that time, a specific plan will be dictated and sent to the referring physician for approval. The patient was educated today on appropriate basic range of motion exercises to begin post operatively and the importance of attending the After Breast Cancer class following surgery.  Patient was educated today on lymphedema risk reduction practices as it pertains to recommendations that will benefit the patient immediately following surgery.  She verbalized good understanding.    Physical Therapy Information for After Breast Cancer Surgery/Treatment:  Lymphedema is a swelling condition that you may be at risk for in your arm if you have lymph nodes removed from the armpit area.  After a sentinel node biopsy, the risk is approximately 5-9% and is higher after an axillary node dissection.  There is treatment available for this condition and it is not life-threatening.  Contact your physician or physical therapist with concerns. You may begin the 4 shoulder/posture exercises (see additional sheet) when permitted by your physician (typically a week after surgery).  If  you have drains, you may need to wait until those are removed before beginning range of motion exercises.  A general recommendation is to not lift your arms above shoulder height until drains are removed.  These exercises should be done to your tolerance and gently.  This is not a "no pain/no gain" type of recovery so listen to your body and stretch into the range of motion that you can tolerate, stopping if you have pain.  If you are having immediate reconstruction, ask your plastic surgeon about doing exercises as he or she may want you to wait. We encourage you to attend the free one time ABC (After Breast Cancer) class offered by [facility].  You will learn  information related to lymphedema risk, prevention and treatment and additional exercises to regain mobility following surgery.  You can call [phone] for more information.  This is offered the 1st and 3rd Monday of each month.  You only attend the class one time. While undergoing any medical procedure or treatment, try to avoid blood pressure being taken or needle sticks from occurring on the arm on the side of cancer.   This recommendation begins after surgery and continues for the rest of your life.  This may help reduce your risk of getting lymphedema (swelling in your arm). An excellent resource for those seeking information on lymphedema is the National Lymphedema Network's web site. It can be accessed at www.lymphnet.org If you notice swelling in your hand, arm or breast at any time following surgery (even if it is many years from now), please contact your doctor or physical therapist to discuss this.  Lymphedema can be treated at any time but it is easier for you if it is treated early on.  If you feel like your shoulder motion is not returning to normal in a reasonable amount of time, please contact your surgeon or physical therapist.  [facility] [phone]. [address], [address], [place] [address]  ABC CLASS After Breast Cancer Class  After Breast Cancer Class is a specially designed exercise class to assist you in a safe recover after having breast cancer surgery.  In this class you will learn how to get back to full function whether your drains were just removed or if you had surgery a month ago.  This one-time class is held the 1st and 3rd Monday of every month from 11:00 a.m. until 12:00 noon virtually.  This class is FREE and space is limited. For more information or to register for the next available class, call [phone].  Class Goals  Understand specific stretches to improve the flexibility of you chest and shoulder. Learn ways  to safely strengthen your upper body and improve your posture. Understand the warning signs of infection and why you may be at risk for an arm infection. Learn about Lymphedema and prevention.  ** You do not attend this class until after surgery.  Drains must be removed to participate  Patient was instructed today in a home exercise program today for post op shoulder range of motion. These included active assist shoulder flexion in sitting, scapular retraction, wall walking with shoulder abduction, and hands behind head external rotation.  She was encouraged to do these twice a day, holding 3 seconds and repeating 5 times when permitted by her physician.  Bethann Punches, [place] 02/03/23 12:04 PM

## 2023-02-03 NOTE — Progress Notes (Signed)
[facility] CONSULT NOTE  Patient Care Team: Arliss Journey, PA-C as PCP - General (Physician Assistant) Pershing Proud, RN as Oncology Nurse Navigator Donnelly Angelica, RN as Oncology Nurse Navigator Harriette Bouillon, MD as Consulting Physician (General Surgery) Rachel Moulds, MD as Consulting Physician (Hematology and Oncology) Antony Blackbird, MD as Consulting Physician (Radiation Oncology)  CHIEF COMPLAINTS/PURPOSE OF CONSULTATION:  Newly diagnosed breast cancer  HISTORY OF PRESENTING ILLNESS:  Alison [name] 77 y.o. female is here because of recent diagnosis of left breast cancer  I reviewed her records extensively and collaborated the history with the patient.  SUMMARY OF ONCOLOGIC HISTORY: Oncology History  Malignant neoplasm of lower-outer quadrant of left breast of female, estrogen receptor positive (HCC)  01/27/2023 Mammogram   Screening / diagnostic mammogram showed 2 x 1.9 x 1.4 cm irregular mass in the left breast highly suggestive of malignancy, ultrasound is recommended to further evaluate mass and determine modality of biopsy.   01/27/2023 Pathology Results   Left breast needle core biopsy at 5:00 showed invasive mammary carcinoma, overall grade 2, prognostic showed ER 90% positive strong staining PR 70% positive moderate to strong staining, Ki-67 of 30%, HER2 1+   02/01/2023 Initial Diagnosis   Malignant neoplasm of lower-outer quadrant of left breast of female, estrogen receptor positive (HCC)   02/03/2023 Cancer Staging   Staging form: Breast, AJCC 8th Edition - Clinical stage from 02/03/2023: Stage IB (cT2, cN0, cM0, G2, ER+, PR+, HER2-) - Signed by Rachel Moulds, MD on 02/03/2023 Stage prefix: Initial diagnosis Histologic grading system: 3 grade system    The patient, with a history of stage 0 breast cancer in the left breast treated with lumpectomy and radiation in 2015, and atypical ductal hyperplasia in 2016, presents for discussion of  treatment options following a recent diagnosis of invasive ductal cancer in the left breast. The patient reports no symptoms related to the cancer, and the lump was discovered during a routine mammogram. The patient expresses concern about potential chemotherapy and hormone therapy following the planned mastectomy. She has a history of not tolerating tamoxifen due to gynecological bleeding. The patient also has a history of degenerative joint disease, with multiple joint replacements. 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   Breast cancer (HCC) 03/2013   Left w/lumpectomy   Contact lens/glasses fitting    wears contacts or glasses   Dysrhythmia    irregular heart beat   Esophageal stricture    Esophagitis    GERD (gastroesophageal reflux disease)    H/O adenomatous polyp of colon    Hiatal hernia    Internal hemorrhoids    Macular degeneration    Left eye   Migraine headache    PONV (postoperative nausea and vomiting)    Radiation 05/16/13-06/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   Uterine polyp    Vertigo     SURGICAL HISTORY: Past Surgical History:  Procedure Laterality Date   BOWEL RESECTION  2017   BREAST BIOPSY Left 04/17/2013   Procedure: BREAST BIOPSY WITH NEEDLE LOCALIZATION;  Surgeon: Adolph Pollack, MD;  Location: [facility];  Service: General;  Laterality: Left;   BREAST LUMPECTOMY WITH RADIOACTIVE SEED LOCALIZATION Left 07/12/2014   Procedure: LEFT BREAST LUMPECTOMY WITH RADIOACTIVE SEED LOCALIZATION;  Surgeon: Avel Peace, MD;  Location: [facility];  Service: General;  Laterality: Left;   COLONOSCOPY  08/08/2019   poor prep, repeat colon 6/30   LAPAROSCOPIC BILATERAL SALPINGO OOPHERECTOMY Bilateral 03/19/2015   Procedure: LAPAROSCOPIC BILATERAL SALPINGO OOPHORECTOMY WITH EXTENSIVE LYSIS OF  ABDOMINAL AND PELVIC ADHESIONS AND  PELVIC WASINGS;  Surgeon: Ok Edwards, MD;  Location: [facility];  Service: Gynecology;  Laterality: Bilateral;   NISSEN FUNDOPLICATION  2006   OVARIAN CYST REMOVAL     TONSILLECTOMY     TOTAL HIP ARTHROPLASTY Right 2005   UPPER GI ENDOSCOPY      SOCIAL HISTORY: Social History   Socioeconomic History   Marital status: Married    Spouse name: Sheri   Number of children: 0   Years of education: 16   Highest education level: Not on file  Occupational History   Occupation: PE TEACHER     Employer: [employer]    Comment: RETIRED   Tobacco Use   Smoking status: Former    Current packs/day: 0.00    Types: Cigarettes    Start date: 08/10/1972    Quit date: 08/10/1977    Years since quitting: 45.5   Smokeless tobacco: Never   Tobacco comments:    She smoked for 5 years in her early 76s.    Vaping Use   Vaping status: Never Used  Substance and Sexual Activity   Alcohol use: Yes    Alcohol/week: 0.0 standard drinks of alcohol   Drug use: No   Sexual activity: Yes    Partners: Female  Other Topics Concern   Not on file  Social History Narrative   Marital Status:  Single Chief Financial Officer)    Children:  None    Pets:  None   Living Situation: Lives with her significant other Hospital doctor)    Occupation: Retired Animator)    Education: Engineer, maintenance (IT) ([employer])    Tobacco Use/Exposure:  She smoked for 5 years in her early 30s.     Alcohol Use:  Moderate   Drug Use:  None   Diet:  Regular   Exercise:  Active Life    Hobbies:  Fishing, Dentist, Golf             Social Determinants of Health   Financial Resource Strain: Low Risk  (05/25/2022)   Received from [facility]   Overall Financial Resource Strain (CARDIA)    Difficulty of Paying Living Expenses: Not hard at all  Food Insecurity: No Food Insecurity (05/25/2022)   Received from [facility]   Hunger Vital Sign    Worried About Running Out of Food in the Last Year: Never  true    Ran Out of Food in the Last Year: Never true  Transportation Needs: No Transportation Needs (05/25/2022)   Received from [facility] - Transportation    Lack of Transportation (Medical): No    Lack of Transportation (Non-Medical): No  Physical Activity: Insufficiently Active (05/25/2022)   Received from [facility]   Exercise Vital Sign    Days of Exercise per Week: 3 days    Minutes of Exercise per Session: 20 min  Stress: No Stress Concern Present (05/25/2022)   Received from [facility] - Occupational Stress Questionnaire    Feeling of Stress : Not at all  Social Connections: Socially Integrated (05/25/2022)   Received from [facility]   Social Network    How would you rate your social network (family, work, friends)?: Good  participation with social networks  Intimate Partner Violence: Not At Risk (05/25/2022)   Received from [facility]   HITS    Over the last 12 months how often did your partner physically hurt you?: Never    Over the last 12 months how often did your partner insult you or talk down to you?: Sometimes    Over the last 12 months how often did your partner threaten you with physical harm?: Never    Over the last 12 months how often did your partner scream or curse at you?: Never    FAMILY HISTORY: Family History  Problem Relation Age of Onset   Heart attack Father    Lung cancer Mother 65       smoker   Breast cancer Maternal Aunt        half-aunt at 20   Colon cancer Neg Hx    Esophageal cancer Neg Hx    Rectal cancer Neg Hx    Stomach cancer Neg Hx     ALLERGIES:  is allergic to adhesive [tape], morphine, and sulfonamide derivatives.  MEDICATIONS:  Current Outpatient Medications  Medication Sig Dispense Refill   aspirin 81 MG tablet Take 81 mg by mouth daily.     Calcium Carb-Cholecalciferol (CALCIUM + D3 PO) Take 2 tablets by mouth daily.      docusate sodium (COLACE) 50 MG  capsule Take 150 mg by mouth daily.     meclizine (ANTIVERT) 25 MG tablet Take 1 tablet (25 mg total) by mouth 3 (three) times daily as needed for dizziness. 90 tablet 1   metoprolol succinate (TOPROL-XL) 50 MG 24 hr tablet TAKE ONE TABLET BY MOUTH DAILY     metoprolol tartrate (LOPRESSOR) 25 MG tablet Take 1 tablet (25 mg total) by mouth 2 (two) times daily. 180 tablet 3   Milk Thistle 1000 MG CAPS Take 2 tablets by mouth daily.      Omega-3 Fatty Acids (FISH OIL) 1000 MG CAPS Take 3 capsules by mouth daily.      pantoprazole (PROTONIX) 20 MG tablet TAKE ONE TABLET BY MOUTH TWICE A DAY 180 tablet 2   SUMAtriptan (IMITREX) 20 MG/ACT nasal spray Place 1 spray (20 mg total) into the nose every 2 (two) hours as needed for migraine. 8 Inhaler 11   TURMERIC PO Take 2 tablets by mouth daily.      No current facility-administered medications for this visit.    REVIEW OF SYSTEMS:   Constitutional: Denies fevers, chills or abnormal night sweats Eyes: Denies blurriness of vision, double vision or watery eyes Ears, nose, mouth, throat, and face: Denies mucositis or sore throat Respiratory: Denies cough, dyspnea or wheezes Cardiovascular: Denies palpitation, chest discomfort or lower extremity swelling Gastrointestinal:  Denies nausea, heartburn or change in bowel habits Skin: Denies abnormal skin rashes Lymphatics: Denies new lymphadenopathy or easy bruising Neurological:Denies numbness, tingling or new weaknesses Behavioral/Psych: Mood is stable, no new changes  Breast: Denies any palpable lumps or discharge All other systems were reviewed with the patient and are negative.  PHYSICAL EXAMINATION: ECOG PERFORMANCE STATUS: 0 - Asymptomatic  Vitals:   02/03/23 0854  BP: 137/86  Pulse: 83  Resp: 16  Temp: 97.6 F (36.4 C)  SpO2: 100%   Filed Weights   02/03/23 0854  Weight: 151 lb 3.2 oz (68.6 kg)    GENERAL:alert, no distress and comfortable SKIN: skin color, texture, turgor are  normal, no rashes or significant lesions EYES: normal, conjunctiva are pink and non-injected, sclera  clear OROPHARYNX:no exudate, no erythema and lips, buccal mucosa, and tongue normal  NECK: supple, thyroid normal size, non-tender, without nodularity.  No cervical or axillary adenopathy ABDOMEN:abdomen soft, non-tender and normal bowel sounds Musculoskeletal:no cyanosis of digits and no clubbing  PSYCH: alert & oriented x 3 with fluent speech NEURO: no focal motor/sensory deficits BREAST: Palpable breast mass in the lower left breast measuring approximately 2 to 3 cm.  No regional adenopathy  LABORATORY DATA:  I have reviewed the data as listed Lab Results  Component Value Date   WBC 5.6 02/03/2023   HGB 13.8 02/03/2023   HCT 41.6 02/03/2023   MCV 91.0 02/03/2023   PLT 310 02/03/2023   Lab Results  Component Value Date   NA 141 02/03/2023   K 4.3 02/03/2023   CL 105 02/03/2023   CO2 29 02/03/2023    RADIOGRAPHIC STUDIES: I have personally reviewed the radiological reports and agreed with the findings in the report.  ASSESSMENT AND PLAN:  Malignant neoplasm of lower-outer quadrant of left breast of female, estrogen receptor positive (HCC) This is a very pleasant 77 year old postmenopausal female patient with history of left breast DCIS, ER/PR positive status post lumpectomy and XRT in 2015 now presents with new diagnosis of left breast IDC to the breast MDC.  Invasive Ductal Carcinoma, Left Breast New diagnosis of invasive ductal carcinoma in the left breast, estrogen receptor positive, progesterone receptor positive, and HER2 negative. Previous history of stage zero breast cancer in the left breast in 2015 treated with lumpectomy and radiation. Discussed the plan for mastectomy due to inability to receive radiation again. Discussed the potential need for chemotherapy based on Oncotype score, and hormone therapy with either tamoxifen or anastrozole/letrozole. -Plan for  mastectomy. -Post-surgery, send specimen for Oncotype testing. -Discuss Oncotype results and potential need for chemotherapy in follow-up visit 3 weeks post-surgery. -Consider hormone therapy with tamoxifen or anastrozole/letrozole post-surgery.  We have reviewed the mechanism of action of tamoxifen versus aromatase inhibitors and adverse effects with each class.  Degenerative Joint Disease History of multiple joint replacements due to degenerative joint disease.   General Health Maintenance / Followup Plans -Follow-up visit 3 weeks post-surgery to discuss Oncotype results and potential need for chemotherapy.  Genetics referral placed, this is her second breast cancer.   All questions were answered. The patient knows to call the clinic with any problems, questions or concerns.    Rachel Moulds, MD 02/03/23

## 2023-02-03 NOTE — Research (Signed)
Trial: [employer] 2021-05 - Specimen Collection Study to Evaluate Biomarkers in Subjects with Cancer   Patient Alison [name] was identified by Dr Al Pimple as a potential candidate for the above listed study.  This Clinical Research Nurse met with Alison [name], Alison [name], on 02/03/23 in a manner and location that ensures patient privacy to discuss participation in the above listed research study.  Patient is Accompanied by family .  A copy of the informed consent document with embedded HIPAA language was provided to the patient.  Patient reads, speaks, and understands [place].    Patient was provided with the business card of this Nurse and encouraged to contact the research team with any questions.  Patient was provided the option of taking informed consent documents home to review and was encouraged to review at their convenience with their support network, including other care providers. Patient is comfortable with making a decision regarding study participation today.  As outlined in the informed consent form, this Nurse and Gardiner Sleeper discussed the purpose of the research study, the investigational nature of the study, study procedures and requirements for study participation, potential risks and benefits of study participation, as well as alternatives to participation. This study is not blinded. The patient understands participation is voluntary and they may withdraw from study participation at any time.  This study does not involve randomization.  This study does not involve an investigational drug or device. This study does not involve a placebo. Patient understands enrollment is pending full eligibility review.   Confidentiality and how the patient's information will be used as part of study participation were discussed.  Patient was informed there is reimbursement provided for their time and effort spent on trial participation.  The patient is encouraged to discuss research study  participation with their insurance provider to determine what costs they may incur as part of study participation, including research related injury.    All questions were answered to patient's satisfaction.  The informed consent with embedded HIPAA language was reviewed page by page.  The patient's mental and emotional status is appropriate to provide informed consent, and the patient verbalizes an understanding of study participation.  Patient has agreed to participate in the above listed research study and has voluntarily signed the informed consent version 07 Apr 2021 with embedded HIPAA language, version 07 Apr 2021  on 02/03/23 at 1219PM.  The patient was provided with a copy of the signed informed consent form with embedded HIPAA language for their reference.  No study specific procedures were obtained prior to the signing of the informed consent document.  Approximately 15 minutes were spent with the patient reviewing the informed consent documents.  Patient was not requested to complete a Release of Information form.  Plan made to follow up with patient via phone on Monday morning 02/08/23 to discuss timing of lab draw. Confirmed phone number [phone].  Margret Chance Tifanny Dollens, RN, BSN, [facility] She  Her  Hers Clinical Research Nurse [facility] Direct Dial [phone] 02/03/2023 2:28 PM

## 2023-02-03 NOTE — Progress Notes (Signed)
Radiation Oncology         [phone] ________________________________  Multidisciplinary Breast Oncology Clinic [facility]) Initial Outpatient Consultation  Name: Alison [name] MRN: [id number]  Date: 02/03/2023  DOB: 22-Sep-1945  CC:Arliss Journey, PA-C  Cornett, Maisie Fus, MD   REFERRING PHYSICIAN: Harriette Bouillon, MD  DIAGNOSIS: The encounter diagnosis was Malignant neoplasm of lower-outer quadrant of left breast of female, estrogen receptor positive (HCC).  Stage IB (cT2, cN0, cM0) Left Breast LOQ, Invasive Ductal Carcinoma, ER+ / PR+ / Her2-, Grade 2  Prior history of left breast DCIS diagnosed in 2015, s/p lumpectomy and XRT  C50.512   ICD-10-CM   1. Malignant neoplasm of lower-outer quadrant of left breast of female, estrogen receptor positive (HCC)  C50.512    Z17.0       HISTORY OF PRESENT ILLNESS::Alison [name] is a 77 y.o. female who is presenting to the office today for evaluation of her newly diagnosed breast cancer. She is accompanied by her supportive friend. She is doing well overall.   She had routine screening mammography on 01/15/23 showing a possible abnormality in the left breast. She underwent bilateral diagnostic mammography with tomography and left breast ultrasonography at [facility] on 01/22/23 showing: a 2 cm mass in the lower inner left breast at at a middle depth, along with calcifications. Collectively, the mass and calcifications span 3.8 cm. .  Biopsy of the 5 o'clock left breast on 01/27/23 showed: grade 2 invasive ductal/mammary carcinoma measuring 1.2 cm in the greatest linear extent of the sample with calcifications. Prognostic indicators significant for: estrogen receptor, 90% positive with strong staining intensity and progesterone receptor, 70% positive with moderate-strong staining intensity. Proliferation marker Ki67 at 30%. HER2 negative.  Menarche: 77 years old GP: 0 LMP: in 2000 Contraceptive: never used HRT: never used    The  patient was referred today for presentation in the multidisciplinary conference.  Radiology studies and pathology slides were presented there for review and discussion of treatment options.  A consensus was discussed regarding potential next steps.  PREVIOUS RADIATION THERAPY: Yes   She has a history of left breast DCIS diagnosed in 2015. S/p lumpectomy (without nodal biopsies) followed by radiation therapy under Dr. Michell Heinrich - 50 Gy completed 06/13/13   PAST MEDICAL HISTORY:  Past Medical History:  Diagnosis Date   Adenomatous colon polyp    Arthritis    Barrett esophagus 03/25/2007   Breast cancer (HCC) 03/2013   Left w/lumpectomy   Contact lens/glasses fitting    wears contacts or glasses   Dysrhythmia    irregular heart beat   Esophageal stricture    Esophagitis    Family history of breast cancer    GERD (gastroesophageal reflux disease)    H/O adenomatous polyp of colon    Hiatal hernia    Internal hemorrhoids    Macular degeneration    Left eye   Migraine headache    Personal history of malignant neoplasm of breast 02/03/2023   Polyposis of colon 02/03/2023   PONV (postoperative nausea and vomiting)    Radiation 05/16/13-06/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   Uterine polyp    Vertigo     PAST SURGICAL HISTORY: Past Surgical History:  Procedure Laterality Date   BOWEL RESECTION  2017   BREAST BIOPSY Left 04/17/2013   Procedure: BREAST BIOPSY WITH NEEDLE LOCALIZATION;  Surgeon: Adolph Pollack, MD;  Location: [facility];  Service: General;  Laterality: Left;   BREAST LUMPECTOMY WITH RADIOACTIVE  SEED LOCALIZATION Left 07/12/2014   Procedure: LEFT BREAST LUMPECTOMY WITH RADIOACTIVE SEED LOCALIZATION;  Surgeon: Avel Peace, MD;  Location: [facility];  Service: General;  Laterality: Left;   COLONOSCOPY  08/08/2019   poor prep, repeat colon 6/30   LAPAROSCOPIC BILATERAL SALPINGO OOPHERECTOMY Bilateral 03/19/2015   Procedure: LAPAROSCOPIC  BILATERAL SALPINGO OOPHORECTOMY WITH EXTENSIVE LYSIS OF ABDOMINAL AND PELVIC ADHESIONS AND  PELVIC Romona Curls;  Surgeon: Ok Edwards, MD;  Location: [facility];  Service: Gynecology;  Laterality: Bilateral;   NISSEN FUNDOPLICATION  2006   OVARIAN CYST REMOVAL     TONSILLECTOMY     TOTAL HIP ARTHROPLASTY Right 2005   UPPER GI ENDOSCOPY      FAMILY HISTORY:  Family History  Problem Relation Age of Onset   Lung cancer Mother 62       smoker   Heart attack Father    Breast cancer Maternal Aunt 45   Colon cancer Neg Hx    Esophageal cancer Neg Hx    Rectal cancer Neg Hx    Stomach cancer Neg Hx     SOCIAL HISTORY:  Social History   Socioeconomic History   Marital status: Married    Spouse name: Sheri   Number of children: 0   Years of education: 16   Highest education level: Not on file  Occupational History   Occupation: PE TEACHER     Employer: [employer]    Comment: RETIRED   Tobacco Use   Smoking status: Former    Current packs/day: 0.00    Types: Cigarettes    Start date: 08/10/1972    Quit date: 08/10/1977    Years since quitting: 45.5   Smokeless tobacco: Never   Tobacco comments:    She smoked for 5 years in her early 40s.    Vaping Use   Vaping status: Never Used  Substance and Sexual Activity   Alcohol use: Yes    Alcohol/week: 0.0 standard drinks of alcohol   Drug use: No   Sexual activity: Yes    Partners: Female  Other Topics Concern   Not on file  Social History Narrative   Marital Status:  Single Chief Financial Officer)    Children:  None    Pets:  None   Living Situation: Lives with her significant other Hospital doctor)    Occupation: Retired Animator)    Education: Engineer, maintenance (IT) ([employer])    Tobacco Use/Exposure:  She smoked for 5 years in her early 30s.     Alcohol Use:  Moderate   Drug Use:  None   Diet:  Regular   Exercise:  Active Life    Hobbies:  Fishing, [place], Golf             Social Determinants of Health   Financial Resource  Strain: Low Risk  (05/25/2022)   Received from [facility]   Overall Financial Resource Strain (CARDIA)    Difficulty of Paying Living Expenses: Not hard at all  Food Insecurity: No Food Insecurity (02/03/2023)   Hunger Vital Sign    Worried About Running Out of Food in the Last Year: Never true    Ran Out of Food in the Last Year: Never true  Transportation Needs: No Transportation Needs (02/03/2023)   PRAPARE - Administrator, Civil Service (Medical): No    Lack of Transportation (Non-Medical): No  Physical Activity: Insufficiently Active (05/25/2022)   [facility] from [facility]   Exercise Vital Sign  Days of Exercise per Week: 3 days    Minutes of Exercise per Session: 20 min  Stress: No Stress Concern Present (05/25/2022)   Received from [facility] - Occupational Stress Questionnaire    Feeling of Stress : Not at all  Social Connections: Socially Integrated (05/25/2022)   Received from [facility]   Social Network    How would you rate your social network (family, work, friends)?: Good participation with social networks    ALLERGIES:  Allergies  Allergen Reactions   Adhesive [Tape]    Morphine Rash   Sulfonamide Derivatives Rash    MEDICATIONS:  Current Outpatient Medications  Medication Sig Dispense Refill   aspirin 81 MG tablet Take 81 mg by mouth daily.     Calcium Carb-Cholecalciferol (CALCIUM + D3 PO) Take 2 tablets by mouth daily.      docusate sodium (COLACE) 50 MG capsule Take 150 mg by mouth daily.     meclizine (ANTIVERT) 25 MG tablet Take 1 tablet (25 mg total) by mouth 3 (three) times daily as needed for dizziness. 90 tablet 1   metoprolol succinate (TOPROL-XL) 50 MG 24 hr tablet TAKE ONE TABLET BY MOUTH DAILY     metoprolol tartrate (LOPRESSOR) 25 MG tablet Take 1 tablet (25 mg total) by mouth 2 (two) times daily. 180 tablet 3   Milk Thistle 1000 MG CAPS Take 2 tablets by mouth daily.       Omega-3 Fatty Acids (FISH OIL) 1000 MG CAPS Take 3 capsules by mouth daily.      pantoprazole (PROTONIX) 20 MG tablet TAKE ONE TABLET BY MOUTH TWICE A DAY 180 tablet 2   SUMAtriptan (IMITREX) 20 MG/ACT nasal spray Place 1 spray (20 mg total) into the nose every 2 (two) hours as needed for migraine. 8 Inhaler 11   TURMERIC PO Take 2 tablets by mouth daily.      No current facility-administered medications for this encounter.    REVIEW OF SYSTEMS: A 10+ POINT REVIEW OF SYSTEMS WAS OBTAINED including neurology, dermatology, psychiatry, cardiac, respiratory, lymph, extremities, GI, GU, musculoskeletal, constitutional, reproductive, HEENT. On the provided form, she reports wearing contacts, breast pain and a palpable breast lump, abnormal moles, back pain, and arthritis. She denies any other symptoms.    PHYSICAL EXAM:     02/03/2023  Vitals with BMI   Height   Weight 151 lbs 3 oz   BMI   Systolic 137   Diastolic 86   Pulse 83    Lungs are clear to auscultation bilaterally. Heart has regular rate and rhythm. No palpable cervical, supraclavicular, or axillary adenopathy. Abdomen soft, non-tender, normal bowel sounds. Breast: Right breast with palpable mass in the LOQ, consistent with previously U/S area of necrosis. Left breast with some palpable scar tissue and some minor bruising near the biopsy area. No other abnormalities were appreciated on the left breast.   KPS = 100  100 - Normal; no complaints; no evidence of disease. 90   - Able to carry on normal activity; minor signs or symptoms of disease. 80   - Normal activity with effort; some signs or symptoms of disease. 57   - Cares for self; unable to carry on normal activity or to do active work. 60   - Requires occasional assistance, but is able to care for most of his personal needs. 50   - Requires considerable assistance and frequent medical care. 40   - Disabled; requires special care  and assistance. 30   - Severely disabled;  hospital admission is indicated although death not imminent. 20   - Very sick; hospital admission necessary; active supportive treatment necessary. 10   - Moribund; fatal processes progressing rapidly. 0     - Dead  Karnofsky DA, [facility], Craver LS and Burchenal [facility] [phone]) The use of the nitrogen mustards in the palliative treatment of carcinoma: with particular reference to bronchogenic carcinoma Cancer 1 634-56  LABORATORY DATA:  Lab Results  Component Value Date   WBC 5.6 02/03/2023   HGB 13.8 02/03/2023   HCT 41.6 02/03/2023   MCV 91.0 02/03/2023   PLT 310 02/03/2023   Lab Results  Component Value Date   NA 141 02/03/2023   K 4.3 02/03/2023   CL 105 02/03/2023   CO2 29 02/03/2023   Lab Results  Component Value Date   ALT 14 02/03/2023   AST 16 02/03/2023   ALKPHOS 88 02/03/2023   BILITOT 0.8 02/03/2023    PULMONARY FUNCTION TEST:   Review Flowsheet        No data to display          RADIOGRAPHY: No results found.    IMPRESSION: Stage IB (cT2, cN0, cM0) Left Breast LOQ, Invasive Ductal Carcinoma, ER+ / PR+ / Her2-, Grade 2   We reviewed the patient's case and final pathology. Dr. Luisa Hart recommends a mastectomy. Given both her history of radiation and the preferred surgical approach, radiation is not recommended in her case. She will proceed with adjuvant anti-hormone therapy under the care of Dr. Al Pimple. Patient expressed understanding of the stated plan and would like to proceed. We appreciate the opportunity to partake in this patient's care. She knows to call with any questions or concerns.   PLAN:  Genetics  Left mastectomy with SLN biopsies  Oncotype  AI   ------------------------------------------------   Joyice Faster, PA-C   Billie Lade, PhD, MD   [facility]  Radiation Oncology Direct Dial: [phone]  Fax: [phone] [email or web address]    This document serves as a record of services personally performed by Antony Blackbird, MD. It was  created on his behalf by Neena Rhymes, a trained medical scribe. The creation of this record is based on the scribe's personal observations and the provider's statements to them. This document has been checked and approved by the attending provider.

## 2023-02-03 NOTE — Progress Notes (Signed)
REFERRING PROVIDER: Arliss Journey, PA-C [address] [place],  [place] [address]  PRIMARY PROVIDER:  Arliss Journey, PA-C  PRIMARY REASON FOR VISIT:  1. Family history of breast cancer   2. Polyposis of colon   3. Malignant neoplasm of lower-outer quadrant of left breast of female, estrogen receptor positive (HCC)   4. Personal history of malignant neoplasm of breast      HISTORY OF PRESENT ILLNESS:   Alison [name], a 77 y.o. female, was seen for a [facility] cancer genetics consultation at the request of Dr. Bufford Buttner due to a personal and family history of breast cancer.  Alison [name] presents to clinic today to discuss the possibility of a hereditary predisposition to cancer, genetic testing, and to further clarify her future cancer risks, as well as potential cancer risks for family members.   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 The treatment plan included lumpectomy.    CANCER HISTORY:  Oncology History  Malignant neoplasm of lower-outer quadrant of left breast of female, estrogen receptor positive (HCC)  01/27/2023 Mammogram   Screening / diagnostic mammogram showed 2 x 1.9 x 1.4 cm irregular mass in the left breast highly suggestive of malignancy, ultrasound is recommended to further evaluate mass and determine modality of biopsy.   01/27/2023 Pathology Results   Left breast needle core biopsy at 5:00 showed invasive mammary carcinoma, overall grade 2, prognostic showed ER 90% positive strong staining PR 70% positive moderate to strong staining, Ki-67 of 30%, HER2 1+   02/01/2023 Initial Diagnosis   Malignant neoplasm of lower-outer quadrant of left breast of female, estrogen receptor positive (HCC)   02/03/2023 Cancer Staging   Staging form: Breast, AJCC 8th Edition - Clinical stage from 02/03/2023: Stage IB (cT2, cN0, cM0, G2, ER+, PR+, HER2-) - Signed by Rachel Moulds, MD on 02/03/2023 Stage prefix: Initial  diagnosis Histologic grading system: 3 grade system      RISK FACTORS:  Menarche was at age 1.  First live birth at age N/A.  OCP use for approximately 0 years.  Ovaries intact: no.  Hysterectomy: yes.  Menopausal status: postmenopausal.  HRT use: 0 years. Colonoscopy: yes;  ~10 polyps . Mammogram within the last year: yes. Number of breast biopsies: 2. Up to date with pelvic exams: yes. 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   Breast cancer (HCC) 03/2013   Left w/lumpectomy   Contact lens/glasses fitting    wears contacts or glasses   Dysrhythmia    irregular heart beat   Esophageal stricture    Esophagitis    Family history of breast cancer    GERD (gastroesophageal reflux disease)    H/O adenomatous polyp of colon    Hiatal hernia    Internal hemorrhoids    Macular degeneration    Left eye   Migraine headache    Personal history of malignant neoplasm of breast 02/03/2023   Polyposis of colon 02/03/2023   PONV (postoperative nausea and vomiting)    Radiation 05/16/13-06/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   Uterine polyp    Vertigo     Past Surgical History:  Procedure Laterality Date   BOWEL RESECTION  2017   BREAST BIOPSY Left 04/17/2013   Procedure: BREAST BIOPSY WITH NEEDLE LOCALIZATION;  Surgeon: Adolph Pollack, MD;  Location: [facility];  Service: General;  Laterality: Left;   BREAST LUMPECTOMY WITH RADIOACTIVE SEED LOCALIZATION Left 07/12/2014   Procedure: LEFT BREAST LUMPECTOMY WITH RADIOACTIVE SEED LOCALIZATION;  Surgeon: Avel Peace, MD;  Location: [facility];  Service: General;  Laterality: Left;   COLONOSCOPY  08/08/2019   poor prep, repeat colon 6/30   LAPAROSCOPIC BILATERAL SALPINGO OOPHERECTOMY Bilateral 03/19/2015   Procedure: LAPAROSCOPIC BILATERAL SALPINGO OOPHORECTOMY WITH EXTENSIVE LYSIS OF  ABDOMINAL AND PELVIC ADHESIONS AND  PELVIC Romona Curls;  Surgeon: Ok Edwards, MD;  Location: [facility];  Service: Gynecology;  Laterality: Bilateral;   NISSEN FUNDOPLICATION  2006   OVARIAN CYST REMOVAL     TONSILLECTOMY     TOTAL HIP ARTHROPLASTY Right 2005   UPPER GI ENDOSCOPY      Social History   Socioeconomic History   Marital status: Married    Spouse name: Sheri   Number of children: 0   Years of education: 16   Highest education level: Not on file  Occupational History   Occupation: PE TEACHER     Employer: [employer]    Comment: RETIRED   Tobacco Use   Smoking status: Former    Current packs/day: 0.00    Types: Cigarettes    Start date: 08/10/1972    Quit date: 08/10/1977    Years since quitting: 45.5   Smokeless tobacco: Never   Tobacco comments:    She smoked for 5 years in her early 70s.    Vaping Use   Vaping status: Never Used  Substance and Sexual Activity   Alcohol use: Yes    Alcohol/week: 0.0 standard drinks of alcohol   Drug use: No   Sexual activity: Yes    Partners: Female  Other Topics Concern   Not on file  Social History Narrative   Marital Status:  Single Chief Financial Officer)    Children:  None    Pets:  None   Living Situation: Lives with her significant other Hospital doctor)    Occupation: Retired Animator)    Education: Engineer, maintenance (IT) ([employer])    Tobacco Use/Exposure:  She smoked for 5 years in her early 30s.     Alcohol Use:  Moderate   Drug Use:  None   Diet:  Regular   Exercise:  Active Life    Hobbies:  Fishing, Dentist, Golf             Social Determinants of Health   Financial Resource Strain: Low Risk  (05/25/2022)   Received from [facility]   Overall Financial Resource Strain (CARDIA)    Difficulty of Paying Living Expenses: Not hard at all  Food Insecurity: No Food Insecurity (05/25/2022)   Received from [facility]   Hunger Vital Sign    Worried About Running Out of Food in the Last Year: Never true    Ran Out of  Food in the Last Year: Never true  Transportation Needs: No Transportation Needs (05/25/2022)   Received from [facility] - Transportation    Lack of Transportation (Medical): No    Lack of Transportation (Non-Medical): No  Physical Activity: Insufficiently Active (05/25/2022)   [facility] from [facility]   Exercise Vital Sign    Days of Exercise per Week: 3 days    Minutes of Exercise per Session: 20 min  Stress: No Stress Concern Present (05/25/2022)   Received from [facility] - Occupational Stress Questionnaire  Feeling of Stress : Not at all  Social Connections: Socially Integrated (05/25/2022)   Received from [facility]   Social Network    How would you rate your social network (family, work, friends)?: Good participation with social networks     FAMILY HISTORY:  We obtained a detailed, 4-generation family history.  Significant diagnoses are listed below: Family History  Problem Relation Age of Onset   Lung cancer Mother 33       smoker   Heart attack Father    Breast cancer Maternal Aunt 66   Colon cancer Neg Hx    Esophageal cancer Neg Hx    Rectal cancer Neg Hx    Stomach cancer Neg Hx     The patient does not have children.  She is an only child. Both parents are deceased.  The patient's mother died of lung cancer at 11.  She had two full sisters and one brother and a paternal half brother and sister.  One full sister had breast cancer at 33.  The maternal grandparents are deceased.  The patient's father died of heart disease at 65.  He had four brothers and two sisters.  There is no report of paternal history of cancer.  Alison [name] is unaware of previous family history of genetic testing for hereditary cancer risks. There is no reported Ashkenazi Jewish ancestry. There is no known consanguinity.  GENETIC COUNSELING ASSESSMENT: Alison [name] is a 77 y.o. female with a personal and family history of breast  cancer which is somewhat suggestive of a hereditary cancer syndrome and predisposition to cancer given the patient's multiple diagnoses and the family history of young breast cancer. We, therefore, discussed and recommended the following at today's visit.   DISCUSSION: We discussed that, in general, most cancer is not inherited in families, but instead is sporadic or familial. Sporadic cancers occur by chance and typically happen at older ages (>50 years) as this type of cancer is caused by genetic changes acquired during an individual's lifetime. Some families have more cancers than would be expected by chance; however, the ages or types of cancer are not consistent with a known genetic mutation or known genetic mutations have been ruled out. This type of familial cancer is thought to be due to a combination of multiple genetic, environmental, hormonal, and lifestyle factors. While this combination of factors likely increases the risk of cancer, the exact source of this risk is not currently identifiable or testable.  We discussed that 5 - 10% of breast cancer is hereditary, with most cases associated with BRCA mutations.  There are other genes that can be associated with hereditary breast cancer syndromes.  These include ATM, CHEK2 and PALB2.  The patient reports having approximately 10 colon polyps, some were adenomatous.  We discussed that testing is beneficial for several reasons including knowing how to follow individuals after completing their treatment, identifying whether potential treatment options such as PARP inhibitors would be beneficial, and understand if other family members could be at risk for cancer and allow them to undergo genetic testing.   We reviewed the characteristics, features and inheritance patterns of hereditary cancer syndromes. We also discussed genetic testing, including the appropriate family members to test, the process of testing, insurance coverage and turn-around-time for  results. We discussed the implications of a negative, positive, carrier and/or variant of uncertain significant result. Alison [name]  was offered a common hereditary cancer panel (36+ genes) and an expanded pan-cancer panel (70+ genes). Alison [name] was  informed of the benefits and limitations of each panel, including that expanded pan-cancer panels contain genes that do not have clear management guidelines at this point in time.  We also discussed that as the number of genes included on a panel increases, the chances of variants of uncertain significance increases. In order to get genetic test results in a timely manner so that Alison [name] can use these genetic test results for surgical decisions, we recommended Alison [name] pursue genetic testing for the BRCAPlus. Once complete, we recommend Alison [name] pursue reflex genetic testing to the CancerNext-Expanded+RNAinsight gene panel.   The CancerNext-Expanded gene panel offered by [facility] and includes sequencing, rearrangement, and RNA analysis for the following 76 genes: AIP, ALK, APC, ATM, AXIN2, BAP1, BARD1, BMPR1A, BRCA1, BRCA2, BRIP1, CDC73, CDH1, CDK4, CDKN1B, CDKN2A, CEBPA, CHEK2, CTNNA1, DDX41, DICER1, ETV6, FH, FLCN, GATA2, LZTR1, MAX, MBD4, MEN1, MET, MLH1, MSH2, MSH3, MSH6, MUTYH, NF1, NF2, NTHL1, PALB2, PHOX2B, PMS2, POT1, PRKAR1A, PTCH1, PTEN, RAD51C, RAD51D, RB1, RET, RUNX1, SDHA, SDHAF2, SDHB, SDHC, SDHD, SMAD4, SMARCA4, SMARCB1, SMARCE1, STK11, SUFU, TMEM127, TP53, TSC1, TSC2, VHL, and WT1 (sequencing and deletion/duplication); EGFR, HOXB13, KIT, MITF, PDGFRA, POLD1, and POLE (sequencing only); EPCAM and GREM1 (deletion/duplication only).    Based on Alison [name]'s personal and family history of cancer, she meets medical criteria for genetic testing. Despite that she meets criteria, she may still have an out of pocket cost.  We discussed that some people do not want to undergo genetic testing due to fear of genetic discrimination.  The Genetic  Information Nondiscrimination Act (GINA) was signed into federal law in 2008. GINA prohibits health insurers and most employers from discriminating against individuals based on genetic information (including the results of genetic tests and family history information). According to GINA, health insurance companies cannot consider genetic information to be a preexisting condition, nor can they use it to make decisions regarding coverage or rates. GINA also makes it illegal for most employers to use genetic information in making decisions about hiring, firing, promotion, or terms of employment. It is important to note that GINA does not offer protections for life insurance, disability insurance, or long-term care insurance. GINA does not apply to those in the [employer], those who work for companies with less than 15 employees, and new life insurance or long-term disability insurance policies.  Health status due to a cancer diagnosis is not protected under GINA. More information about GINA can be found by visiting [email or web address].    PLAN: After considering the risks, benefits, and limitations, Alison [name] provided informed consent to pursue genetic testing and the blood sample was sent to [facility] for analysis of the CancerNext-Expanded+RNAinsight. Results should be available within approximately 2-3 weeks' time, at which point they will be disclosed by telephone to Alison [name], as will any additional recommendations warranted by these results. Alison [name] will receive a summary of her genetic counseling visit and a copy of her results once available. This information will also be available in Epic.   Lastly, we encouraged Alison [name] to remain in contact with cancer genetics annually so that we can continuously update the family history and inform her of any changes in cancer genetics and testing that may be of benefit for this family.   Alison [name] questions were answered to her satisfaction  today. Our contact information was provided should additional questions or concerns arise. Thank you for the referral and allowing [place] to share in the care of your patient.   Timberly Yott P.  Lowell Guitar, MS, [facility] Licensed, Patent attorney [email or web address] phone: [phone]  The patient was seen for a total of 25 minutes in face-to-face genetic counseling.  The patient brought a friend. Drs. Meliton Rattan, and/or [facility] were available for questions, if needed..    _______________________________________________________________________ For Office Staff:  Number of people involved in session: 2 Was an Intern/ student involved with case: no

## 2023-02-03 NOTE — Assessment & Plan Note (Signed)
This is a very pleasant 77 year old postmenopausal female patient with history of left breast DCIS, ER/PR positive status post lumpectomy and XRT in 2015 now presents with new diagnosis of left breast IDC to the breast MDC.  Invasive Ductal Carcinoma, Left Breast New diagnosis of invasive ductal carcinoma in the left breast, estrogen receptor positive, progesterone receptor positive, and HER2 negative. Previous history of stage zero breast cancer in the left breast in 2015 treated with lumpectomy and radiation. Discussed the plan for mastectomy due to inability to receive radiation again. Discussed the potential need for chemotherapy based on Oncotype score, and hormone therapy with either tamoxifen or anastrozole/letrozole. -Plan for mastectomy. -Post-surgery, send specimen for Oncotype testing. -Discuss Oncotype results and potential need for chemotherapy in follow-up visit 3 weeks post-surgery. -Consider hormone therapy with tamoxifen or anastrozole/letrozole post-surgery.  We have reviewed the mechanism of action of tamoxifen versus aromatase inhibitors and adverse effects with each class.  Degenerative Joint Disease History of multiple joint replacements due to degenerative joint disease.   General Health Maintenance / Followup Plans -Follow-up visit 3 weeks post-surgery to discuss Oncotype results and potential need for chemotherapy.  Genetics referral placed, this is her second breast cancer.

## 2023-02-05 ENCOUNTER — Encounter: Payer: Self-pay | Admitting: Hematology and Oncology

## 2023-02-08 ENCOUNTER — Telehealth: Payer: Self-pay

## 2023-02-08 ENCOUNTER — Ambulatory Visit: Payer: Self-pay | Admitting: Surgery

## 2023-02-08 ENCOUNTER — Telehealth: Payer: Self-pay | Admitting: *Deleted

## 2023-02-08 NOTE — Telephone Encounter (Signed)
[employer] 2021-05 - Specimen Collection Study to Evaluate Biomarkers in Subjects with Cancer   Research Rn attempted to call pt to follow up on when she would be available for lab draw for exact sciences study. Rn was unable to reach pt or leave voicemail with this attempt. Rn will call pt back in the morning to seek this information.   Zerita Boers BSN RN Clinical Research Nurse [facility] Direct Dial: [phone] 02/08/2023  2:22 PM

## 2023-02-08 NOTE — Telephone Encounter (Signed)
Spoke to pt concerning BMDC from 02/03/23. Denies questions or concerns regarding dx or treatment care plan. Encourage pt to call with needs. Received verbal understanding.

## 2023-02-09 ENCOUNTER — Telehealth: Payer: Self-pay

## 2023-02-09 NOTE — Telephone Encounter (Signed)
[employer] 2021-05 - Specimen Collection Study to Evaluate Biomarkers in Subjects with Cancer   Research RN left message for follow up on specimen collection needed for exact sciences study. RN left call back details with message. RN will continue to follow up with pt.   Zerita Boers BSN RN Clinical Research Nurse [facility] Direct Dial: [phone] 02/09/2023  10:15 AM

## 2023-02-10 DIAGNOSIS — Z170 Estrogen receptor positive status [ER+]: Secondary | ICD-10-CM

## 2023-02-10 NOTE — Research (Signed)
[employer] 2021-05 - Specimen Collection Study to Evaluate Biomarkers in Subjects with Cancer   Research RN called pt to follow up on when she would be available for labs to be drawn for exact sciences study. Pt stated she could come in on 02-17-23 at 2pm for these labs. Rn answered all pt questions. Rn will reach out to scheduling for lab appointment.   Zerita Boers BSN RN Clinical Research Nurse [facility] Direct Dial: [phone] 02/10/2023  9:42 AM

## 2023-02-10 NOTE — Telephone Encounter (Signed)
Called ans spoke with patient confirmed 12/11 @230  labs before CT. Patient confirmed.

## 2023-02-11 ENCOUNTER — Encounter: Payer: Self-pay | Admitting: *Deleted

## 2023-02-17 ENCOUNTER — Other Ambulatory Visit: Payer: Self-pay

## 2023-02-17 ENCOUNTER — Encounter (HOSPITAL_BASED_OUTPATIENT_CLINIC_OR_DEPARTMENT_OTHER)
Admission: RE | Admit: 2023-02-17 | Discharge: 2023-02-17 | Disposition: A | Discharge status: Home / Self Care | Payer: Medicare PPO | Source: Ambulatory Visit | Attending: Surgery | Admitting: Surgery

## 2023-02-17 ENCOUNTER — Ambulatory Visit: Payer: Self-pay | Admitting: Genetic Counselor

## 2023-02-17 ENCOUNTER — Inpatient Hospital Stay: Payer: Medicare PPO | Attending: Hematology and Oncology

## 2023-02-17 ENCOUNTER — Telehealth: Payer: Self-pay | Admitting: Genetic Counselor

## 2023-02-17 ENCOUNTER — Encounter: Payer: Self-pay | Admitting: Genetic Counselor

## 2023-02-17 DIAGNOSIS — Z01810 Encounter for preprocedural cardiovascular examination: Secondary | ICD-10-CM | POA: Diagnosis present

## 2023-02-17 DIAGNOSIS — Z1379 Encounter for other screening for genetic and chromosomal anomalies: Secondary | ICD-10-CM | POA: Insufficient documentation

## 2023-02-17 DIAGNOSIS — Z170 Estrogen receptor positive status [ER+]: Secondary | ICD-10-CM

## 2023-02-17 LAB — RESEARCH LABS

## 2023-02-17 MED ORDER — CHLORHEXIDINE GLUCONATE CLOTH 2 % EX PADS: 6.0000 | MEDICATED_PAD | Freq: Once | CUTANEOUS | Status: DC

## 2023-02-17 NOTE — Telephone Encounter (Signed)
Revealed negative genetic testing.  Discussed that we do not know why she has breast cancer or why there is cancer in the family. It could be due to a different gene that we are not testing, or maybe our current technology may not be able to pick something up.  It will be important for her to keep in contact with genetics to keep up with whether additional testing may be needed. 

## 2023-02-17 NOTE — Research (Signed)
[employer] 2021-05 - Specimen Collection Study to Evaluate Biomarkers in Subjects with Cancer    This Nurse has reviewed this patient's inclusion and exclusion criteria and confirmed Alison [name] is eligible for study participation.  Patient will continue with enrollment.  Eligibility confirmed by treating investigator, who also agrees that patient should proceed with enrollment.  Pt signed consent on 02-03-24 and blood draw to be done on 02-17-23 via fresh venipuncture.   Zerita Boers BSN RN Clinical Research Nurse [facility] Direct Dial: [phone] 02/17/2023  8:33 AM

## 2023-02-17 NOTE — Progress Notes (Signed)
HPI:  Alison [name] was previously seen in the [facility] due to a personal and family history of breast cancer and concerns regarding a hereditary predisposition to cancer. Please refer to our prior cancer genetics clinic note for more information regarding our discussion, assessment and recommendations, at the time. Alison [name] recent genetic [name] results were disclosed to her, as were recommendations warranted by these results. These results and recommendations are discussed in more detail below.  CANCER HISTORY:  Oncology History  Malignant neoplasm of lower-outer quadrant of left breast of female, estrogen receptor positive (HCC)  01/27/2023 Mammogram   Screening / diagnostic mammogram showed 2 x 1.9 x 1.4 cm irregular mass in the left breast highly suggestive of malignancy, ultrasound is recommended to further evaluate mass and determine modality of biopsy.   01/27/2023 Pathology Results   Left breast needle core biopsy at 5:00 showed invasive mammary carcinoma, overall grade 2, prognostic showed ER 90% positive strong staining PR 70% positive moderate to strong staining, Ki-67 of 30%, HER2 1+   02/01/2023 Initial Diagnosis   Malignant neoplasm of lower-outer quadrant of left breast of female, estrogen receptor positive (HCC)   02/03/2023 Cancer Staging   Staging form: Breast, AJCC 8th Edition - Clinical stage from 02/03/2023: Stage IB (cT2, cN0, cM0, G2, ER+, PR+, HER2-) - Signed by Rachel Moulds, MD on 02/03/2023 Stage prefix: Initial diagnosis Histologic grading system: 3 grade system   02/16/2023 Genetic Testing   Negative genetic testing on the BRCAPlus and CancerNext-Expanded+RNAinsight panel.  The report date for both tests is 02/16/2023.  The CancerNext-Expanded gene panel offered by [facility] and includes sequencing, rearrangement, and RNA analysis for the following 76 genes: AIP, ALK, APC, ATM, AXIN2, BAP1, BARD1, BMPR1A, BRCA1, BRCA2, BRIP1, CDC73,  CDH1, CDK4, CDKN1B, CDKN2A, CEBPA, CHEK2, CTNNA1, DDX41, DICER1, ETV6, FH, FLCN, GATA2, LZTR1, MAX, MBD4, MEN1, MET, MLH1, MSH2, MSH3, MSH6, MUTYH, NF1, NF2, NTHL1, PALB2, PHOX2B, PMS2, POT1, PRKAR1A, PTCH1, PTEN, RAD51C, RAD51D, RB1, RET, RUNX1, SDHA, SDHAF2, SDHB, SDHC, SDHD, SMAD4, SMARCA4, SMARCB1, SMARCE1, STK11, SUFU, TMEM127, TP53, TSC1, TSC2, VHL, and WT1 (sequencing and deletion/duplication); EGFR, HOXB13, KIT, MITF, PDGFRA, POLD1, and POLE (sequencing only); EPCAM and GREM1 (deletion/duplication only).       FAMILY HISTORY:  We obtained a detailed, 4-generation family history.  Significant diagnoses are listed below: Family History  Problem Relation Age of Onset   Lung cancer Mother 44       smoker   Heart attack Father    Breast cancer Maternal Aunt 56   Colon cancer Neg Hx    Esophageal cancer Neg Hx    Rectal cancer Neg Hx    Stomach cancer Neg Hx      The patient does not have children.  She is an only child. Both parents are deceased.   The patient's mother died of lung cancer at 70.  She had two full sisters and one brother and a paternal half brother and sister.  One full sister had breast cancer at 55.  The maternal grandparents are deceased.   The patient's father died of heart disease at 32.  He had four brothers and two sisters.  There is no report of paternal history of cancer.   Alison [name] is unaware of previous family history of genetic testing for hereditary cancer risks. There is no reported Ashkenazi Jewish ancestry. There is no known consanguinity  GENETIC [name] RESULTS: Genetic testing reported out on February 16, 2023 through the CancerNext-Expanded+RNAinsight cancer panel found  no pathogenic mutations. The CancerNext-Expanded gene panel offered by [facility] and includes sequencing, rearrangement, and RNA analysis for the following 76 genes: AIP, ALK, APC, ATM, AXIN2, BAP1, BARD1, BMPR1A, BRCA1, BRCA2, BRIP1, CDC73, CDH1, CDK4, CDKN1B, CDKN2A, CEBPA,  CHEK2, CTNNA1, DDX41, DICER1, ETV6, FH, FLCN, GATA2, LZTR1, MAX, MBD4, MEN1, MET, MLH1, MSH2, MSH3, MSH6, MUTYH, NF1, NF2, NTHL1, PALB2, PHOX2B, PMS2, POT1, PRKAR1A, PTCH1, PTEN, RAD51C, RAD51D, RB1, RET, RUNX1, SDHA, SDHAF2, SDHB, SDHC, SDHD, SMAD4, SMARCA4, SMARCB1, SMARCE1, STK11, SUFU, TMEM127, TP53, TSC1, TSC2, VHL, and WT1 (sequencing and deletion/duplication); EGFR, HOXB13, KIT, MITF, PDGFRA, POLD1, and POLE (sequencing only); EPCAM and GREM1 (deletion/duplication only). The [name] report has been scanned into EPIC and is located under the Molecular Pathology section of the Results Review tab.  A portion of the result report is included below for reference.     We discussed with Alison [name] that because current genetic testing is not perfect, it is possible there may be a gene mutation in one of these genes that current testing cannot detect, but that chance is small.  We also discussed, that there could be another gene that has not yet been discovered, or that we have not yet tested, that is responsible for the cancer diagnoses in the family. It is also possible there is a hereditary cause for the cancer in the family that Alison [name] and therefore was not identified in her testing.  Therefore, it is important to remain in touch with cancer genetics in the future so that we can continue to offer Alison [name] the most up to date genetic testing.   ADDITIONAL GENETIC TESTING: We discussed with Alison [name] that her genetic testing was fairly extensive.  If there are genes identified to increase cancer risk that can be analyzed in the future, we would be happy to discuss and coordinate this testing at that time.    CANCER SCREENING RECOMMENDATIONS: Alison [name] [name] result is considered negative (normal).  This means that we have not identified a hereditary cause for her personal and family history of breast cancer at this time. Most cancers happen by chance and this negative [name] suggests that  her personal and family history of cancer may fall into this category.    Possible reasons for Alison [name] include:  1. There may be a gene mutation in one of these genes that current testing methods cannot detect but that chance is small.  2. There could be another gene that has not yet been discovered, or that we have not yet tested, that is responsible for the cancer diagnoses in the family.  3.  There may be no hereditary risk for cancer in the family. The cancers in Alison [name] and/or her family may be sporadic/familial or due to other genetic and environmental factors. 4. It is also possible there is a hereditary cause for the cancer in the family that Alison [name] did not [name].  Therefore, it is recommended she continue to follow the cancer management and screening guidelines provided by her oncology and primary healthcare provider. An individual's cancer risk and medical management are not determined by genetic [name] results alone. Overall cancer risk assessment incorporates additional factors, including personal medical history, family history, and any available genetic information that may result in a personalized plan for cancer prevention and surveillance  RECOMMENDATIONS FOR FAMILY MEMBERS:  Individuals in this family might be at some increased risk of developing cancer, over the general population risk, simply due  to the family history of cancer.  We recommended women in this family have a yearly mammogram beginning at age 58, or 70 years younger than the earliest onset of cancer, an annual clinical breast exam, and perform monthly breast self-exams. Women in this family should also have a gynecological exam as recommended by their primary provider. All family members should be referred for colonoscopy starting at age 59, or 31 years younger than the earliest onset of cancer.  FOLLOW-UP: Lastly, we discussed with Alison [name] that cancer genetics is a rapidly advancing  field and it is possible that new genetic tests will be appropriate for her and/or her family members in the future. We encouraged her to remain in contact with cancer genetics on an annual basis so we can update her personal and family histories and let her know of advances in cancer genetics that may benefit this family.   Our contact number was provided. Alison [name]. Massie questions were answered to her satisfaction, and she knows she is welcome to call us at anytime with additional questions or concerns.   Maylon Cos, Alison [name], [facility] Licensed, Certified Genetic Counselor [email or web address]

## 2023-02-17 NOTE — Research (Signed)
[employer] 2021-05 - Specimen Collection Study to Evaluate Biomarkers in Subjects with Cancer   This Nurse has reviewed this patient's inclusion and exclusion criteria as a second review and confirms Alison [name] is eligible for study participation.  Patient may continue with enrollment.  Margret Chance Lavilla Delamora, RN, BSN, [facility]  Her  Hers Clinical Research Nurse [facility] Direct Dial [phone] 02/17/2023 1100am

## 2023-02-19 ENCOUNTER — Telehealth: Payer: Self-pay | Admitting: Hematology and Oncology

## 2023-02-23 NOTE — Anesthesia Preprocedure Evaluation (Signed)
Anesthesia Evaluation  Patient identified by MRN, date of birth, ID band Patient awake    Reviewed: Allergy & Precautions, NPO status , Patient's Chart, lab work & pertinent test results  History of Anesthesia Complications (+) PONV and history of anesthetic complications  Airway Mallampati: II  TM Distance: >3 FB Neck ROM: Full    Dental no notable dental hx.    Pulmonary former smoker   Pulmonary exam normal breath sounds clear to auscultation       Cardiovascular hypertension, Pt. on home beta blockers Normal cardiovascular exam+ dysrhythmias  Rhythm:Regular Rate:Normal     Neuro/Psych  Headaches    GI/Hepatic Neg liver ROS, hiatal hernia,GERD  ,,  Endo/Other  negative endocrine ROS    Renal/GU negative Renal ROS     Musculoskeletal  (+) Arthritis ,    Abdominal   Peds  Hematology   Anesthesia Other Findings   Reproductive/Obstetrics                             Anesthesia Physical Anesthesia Plan  ASA: 3  Anesthesia Plan: General   Post-op Pain Management: Tylenol PO (pre-op)*, Regional block* and Gabapentin PO (pre-op)*   Induction: Intravenous  PONV Risk Score and Plan: 4 or greater and Propofol infusion, TIVA, Treatment may vary due to age or medical condition, Ondansetron and Dexamethasone  Airway Management Planned: Oral ETT and LMA  Additional Equipment:   Intra-op Plan:   Post-operative Plan: Extubation in OR  Informed Consent: I have reviewed the patients History and Physical, chart, labs and discussed the procedure including the risks, benefits and alternatives for the proposed anesthesia with the patient or authorized representative who has indicated his/her understanding and acceptance.     Dental advisory given  Plan Discussed with: CRNA  Anesthesia Plan Comments:         Anesthesia Quick Evaluation

## 2023-02-24 ENCOUNTER — Ambulatory Visit (HOSPITAL_BASED_OUTPATIENT_CLINIC_OR_DEPARTMENT_OTHER)
Admission: RE | Admit: 2023-02-24 | Discharge: 2023-02-25 | Disposition: A | Discharge status: Home / Self Care | Payer: Medicare PPO | Attending: Surgery | Admitting: Surgery

## 2023-02-24 ENCOUNTER — Other Ambulatory Visit: Payer: Self-pay

## 2023-02-24 ENCOUNTER — Ambulatory Visit (HOSPITAL_BASED_OUTPATIENT_CLINIC_OR_DEPARTMENT_OTHER): Payer: Medicare PPO | Admitting: Anesthesiology

## 2023-02-24 ENCOUNTER — Ambulatory Visit (HOSPITAL_BASED_OUTPATIENT_CLINIC_OR_DEPARTMENT_OTHER): Payer: Self-pay | Admitting: Anesthesiology

## 2023-02-24 ENCOUNTER — Encounter (HOSPITAL_BASED_OUTPATIENT_CLINIC_OR_DEPARTMENT_OTHER): Payer: Self-pay | Admitting: Surgery

## 2023-02-24 ENCOUNTER — Encounter (HOSPITAL_BASED_OUTPATIENT_CLINIC_OR_DEPARTMENT_OTHER)
Admission: RE | Disposition: A | Discharge status: Home / Self Care | Payer: Self-pay | Source: Home / Self Care | Attending: Surgery

## 2023-02-24 DIAGNOSIS — Z87891 Personal history of nicotine dependence: Secondary | ICD-10-CM | POA: Insufficient documentation

## 2023-02-24 DIAGNOSIS — M19012 Primary osteoarthritis, left shoulder: Secondary | ICD-10-CM | POA: Insufficient documentation

## 2023-02-24 DIAGNOSIS — Z923 Personal history of irradiation: Secondary | ICD-10-CM | POA: Insufficient documentation

## 2023-02-24 DIAGNOSIS — Z170 Estrogen receptor positive status [ER+]: Secondary | ICD-10-CM | POA: Insufficient documentation

## 2023-02-24 DIAGNOSIS — K449 Diaphragmatic hernia without obstruction or gangrene: Secondary | ICD-10-CM | POA: Diagnosis not present

## 2023-02-24 DIAGNOSIS — I10 Essential (primary) hypertension: Secondary | ICD-10-CM | POA: Insufficient documentation

## 2023-02-24 DIAGNOSIS — K219 Gastro-esophageal reflux disease without esophagitis: Secondary | ICD-10-CM | POA: Insufficient documentation

## 2023-02-24 DIAGNOSIS — Z1721 Progesterone receptor positive status: Secondary | ICD-10-CM | POA: Insufficient documentation

## 2023-02-24 DIAGNOSIS — C50512 Malignant neoplasm of lower-outer quadrant of left female breast: Secondary | ICD-10-CM | POA: Diagnosis present

## 2023-02-24 DIAGNOSIS — Z79899 Other long term (current) drug therapy: Secondary | ICD-10-CM | POA: Insufficient documentation

## 2023-02-24 DIAGNOSIS — C50912 Malignant neoplasm of unspecified site of left female breast: Secondary | ICD-10-CM | POA: Diagnosis not present

## 2023-02-24 HISTORY — PX: SIMPLE MASTECTOMY WITH AXILLARY SENTINEL NODE BIOPSY: SHX6098

## 2023-02-24 SURGERY — SIMPLE MASTECTOMY
Anesthesia: General | Site: Breast | Laterality: Bilateral

## 2023-02-24 MED ORDER — DEXAMETHASONE SODIUM PHOSPHATE 10 MG/ML IJ SOLN
INTRAMUSCULAR | Status: DC | PRN
  Administered 2023-02-24: 10 mg via INTRAVENOUS

## 2023-02-24 MED ORDER — PANTOPRAZOLE SODIUM 20 MG PO TBEC
20.0000 mg | DELAYED_RELEASE_TABLET | Freq: Two times a day (BID) | ORAL | Status: DC
  Administered 2023-02-24: 20 mg via ORAL
  Filled 2023-02-24: qty 1

## 2023-02-24 MED ORDER — DEXAMETHASONE SODIUM PHOSPHATE 10 MG/ML IJ SOLN
INTRAMUSCULAR | Status: AC
  Filled 2023-02-24: qty 1

## 2023-02-24 MED ORDER — ACETAMINOPHEN 325 MG PO TABS
650.0000 mg | ORAL_TABLET | ORAL | Status: DC | PRN
  Administered 2023-02-24: 650 mg via ORAL
  Filled 2023-02-24: qty 2

## 2023-02-24 MED ORDER — CLONIDINE HCL (ANALGESIA) 100 MCG/ML EP SOLN
EPIDURAL | Status: DC | PRN
  Administered 2023-02-24 (×2): 30 ug

## 2023-02-24 MED ORDER — CEFAZOLIN SODIUM-DEXTROSE 2-4 GM/100ML-% IV SOLN
2.0000 g | INTRAVENOUS | Status: AC
  Administered 2023-02-24: 2 g via INTRAVENOUS

## 2023-02-24 MED ORDER — 0.9 % SODIUM CHLORIDE (POUR BTL) OPTIME
TOPICAL | Status: DC | PRN
  Administered 2023-02-24: 1000 mL

## 2023-02-24 MED ORDER — EPHEDRINE SULFATE (PRESSORS) 50 MG/ML IJ SOLN
INTRAMUSCULAR | Status: DC | PRN
  Administered 2023-02-24 (×3): 5 mg via INTRAVENOUS
  Administered 2023-02-24: 10 mg via INTRAVENOUS

## 2023-02-24 MED ORDER — GABAPENTIN 300 MG PO CAPS
ORAL_CAPSULE | ORAL | Status: AC
  Filled 2023-02-24: qty 1

## 2023-02-24 MED ORDER — FENTANYL CITRATE (PF) 100 MCG/2ML IJ SOLN
INTRAMUSCULAR | Status: DC | PRN
  Administered 2023-02-24: 50 ug via INTRAVENOUS
  Administered 2023-02-24 (×2): 25 ug via INTRAVENOUS

## 2023-02-24 MED ORDER — METOPROLOL SUCCINATE ER 50 MG PO TB24
50.0000 mg | ORAL_TABLET | Freq: Every day | ORAL | Status: DC
  Administered 2023-02-24: 50 mg via ORAL
  Filled 2023-02-24 (×2): qty 1

## 2023-02-24 MED ORDER — OXYCODONE HCL 5 MG PO TABS: 5.0000 mg | ORAL_TABLET | ORAL | Status: DC | PRN

## 2023-02-24 MED ORDER — CEFAZOLIN SODIUM-DEXTROSE 2-4 GM/100ML-% IV SOLN
INTRAVENOUS | Status: AC
  Filled 2023-02-24: qty 100

## 2023-02-24 MED ORDER — ONDANSETRON HCL 4 MG/2ML IJ SOLN
INTRAMUSCULAR | Status: DC | PRN
  Administered 2023-02-24: 4 mg via INTRAVENOUS

## 2023-02-24 MED ORDER — PROPOFOL 10 MG/ML IV BOLUS
INTRAVENOUS | Status: DC | PRN
  Administered 2023-02-24: 120 mg via INTRAVENOUS

## 2023-02-24 MED ORDER — MIDAZOLAM HCL 2 MG/2ML IJ SOLN
INTRAMUSCULAR | Status: AC
  Filled 2023-02-24: qty 2

## 2023-02-24 MED ORDER — SODIUM CHLORIDE 0.9 % IV SOLN: 250.0000 mL | INTRAVENOUS | Status: DC | PRN

## 2023-02-24 MED ORDER — FENTANYL CITRATE (PF) 100 MCG/2ML IJ SOLN
INTRAMUSCULAR | Status: AC
  Filled 2023-02-24: qty 2

## 2023-02-24 MED ORDER — TRANEXAMIC ACID 1000 MG/10ML IV SOLN
INTRAVENOUS | Status: AC
  Filled 2023-02-24: qty 30

## 2023-02-24 MED ORDER — ROCURONIUM BROMIDE 10 MG/ML (PF) SYRINGE
PREFILLED_SYRINGE | INTRAVENOUS | Status: AC
Start: 2023-02-24 — End: ?
  Filled 2023-02-24: qty 10

## 2023-02-24 MED ORDER — GABAPENTIN 300 MG PO CAPS
300.0000 mg | ORAL_CAPSULE | ORAL | Status: AC
  Administered 2023-02-24: 300 mg via ORAL

## 2023-02-24 MED ORDER — ACETAMINOPHEN 500 MG PO TABS
ORAL_TABLET | ORAL | Status: AC
  Filled 2023-02-24: qty 2

## 2023-02-24 MED ORDER — PHENYLEPHRINE 80 MCG/ML (10ML) SYRINGE FOR IV PUSH (FOR BLOOD PRESSURE SUPPORT)
PREFILLED_SYRINGE | INTRAVENOUS | Status: AC
Start: 2023-02-24 — End: ?
  Filled 2023-02-24: qty 10

## 2023-02-24 MED ORDER — FENTANYL CITRATE (PF) 100 MCG/2ML IJ SOLN: 25.0000 ug | INTRAMUSCULAR | Status: DC | PRN

## 2023-02-24 MED ORDER — DROPERIDOL 2.5 MG/ML IJ SOLN
0.6250 mg | Freq: Once | INTRAMUSCULAR | Status: DC | PRN
Start: 2023-02-24 — End: 2023-02-25

## 2023-02-24 MED ORDER — DEXAMETHASONE SODIUM PHOSPHATE 4 MG/ML IJ SOLN
INTRAMUSCULAR | Status: DC | PRN
  Administered 2023-02-24 (×2): 3 mg via PERINEURAL

## 2023-02-24 MED ORDER — OXYCODONE HCL 5 MG PO TABS: 5.0000 mg | ORAL_TABLET | Freq: Once | ORAL | Status: DC | PRN

## 2023-02-24 MED ORDER — PROPOFOL 500 MG/50ML IV EMUL
INTRAVENOUS | Status: DC | PRN
  Administered 2023-02-24: 150 ug/kg/min via INTRAVENOUS

## 2023-02-24 MED ORDER — SODIUM CHLORIDE 0.9% FLUSH: 3.0000 mL | INTRAVENOUS | Status: DC | PRN

## 2023-02-24 MED ORDER — LIDOCAINE 2% (20 MG/ML) 5 ML SYRINGE
INTRAMUSCULAR | Status: AC
  Filled 2023-02-24: qty 5

## 2023-02-24 MED ORDER — ACETAMINOPHEN 500 MG PO TABS
1000.0000 mg | ORAL_TABLET | Freq: Once | ORAL | Status: AC
Start: 2023-02-24 — End: 2023-02-24
  Administered 2023-02-24: 1000 mg via ORAL

## 2023-02-24 MED ORDER — FENTANYL CITRATE (PF) 100 MCG/2ML IJ SOLN
50.0000 ug | Freq: Once | INTRAMUSCULAR | Status: AC
  Administered 2023-02-24: 50 ug via INTRAVENOUS

## 2023-02-24 MED ORDER — ACETAMINOPHEN 500 MG PO TABS: 1000.0000 mg | ORAL_TABLET | ORAL | Status: AC

## 2023-02-24 MED ORDER — SUMATRIPTAN 20 MG/ACT NA SOLN
1.0000 | NASAL | Status: DC | PRN
Start: 2023-02-24 — End: 2023-02-25
  Filled 2023-02-24: qty 1

## 2023-02-24 MED ORDER — PROPOFOL 10 MG/ML IV BOLUS
INTRAVENOUS | Status: AC
  Filled 2023-02-24: qty 20

## 2023-02-24 MED ORDER — ONDANSETRON HCL 4 MG/2ML IJ SOLN: 4.0000 mg | INTRAMUSCULAR | Status: DC | PRN

## 2023-02-24 MED ORDER — SODIUM CHLORIDE 0.9% FLUSH
3.0000 mL | Freq: Two times a day (BID) | INTRAVENOUS | Status: DC
  Administered 2023-02-24 (×2): 3 mL via INTRAVENOUS

## 2023-02-24 MED ORDER — LACTATED RINGERS IV SOLN: INTRAVENOUS | Status: DC | PRN

## 2023-02-24 MED ORDER — SUGAMMADEX SODIUM 200 MG/2ML IV SOLN
INTRAVENOUS | Status: DC | PRN
  Administered 2023-02-24: 150 mg via INTRAVENOUS

## 2023-02-24 MED ORDER — ACETAMINOPHEN 325 MG RE SUPP: 650.0000 mg | RECTAL | Status: DC | PRN

## 2023-02-24 MED ORDER — OXYCODONE HCL 5 MG/5ML PO SOLN: 5.0000 mg | Freq: Once | ORAL | Status: DC | PRN

## 2023-02-24 MED ORDER — PHENYLEPHRINE HCL (PRESSORS) 10 MG/ML IV SOLN
INTRAVENOUS | Status: DC | PRN
  Administered 2023-02-24: 80 ug via INTRAVENOUS

## 2023-02-24 MED ORDER — ROPIVACAINE HCL 5 MG/ML IJ SOLN
INTRAMUSCULAR | Status: DC | PRN
  Administered 2023-02-24 (×2): 20 mL via PERINEURAL

## 2023-02-24 MED ORDER — LIDOCAINE HCL (CARDIAC) PF 100 MG/5ML IV SOSY
PREFILLED_SYRINGE | INTRAVENOUS | Status: DC | PRN
  Administered 2023-02-24: 60 mg via INTRAVENOUS

## 2023-02-24 MED ORDER — MECLIZINE HCL 25 MG PO TABS
25.0000 mg | ORAL_TABLET | Freq: Three times a day (TID) | ORAL | Status: DC | PRN
  Filled 2023-02-24: qty 1

## 2023-02-24 MED ORDER — OXYCODONE HCL 5 MG PO TABS: 5.0000 mg | ORAL_TABLET | Freq: Four times a day (QID) | ORAL | 0 refills | Status: AC | PRN

## 2023-02-24 MED ORDER — ROCURONIUM BROMIDE 100 MG/10ML IV SOLN
INTRAVENOUS | Status: DC | PRN
  Administered 2023-02-24: 50 mg via INTRAVENOUS

## 2023-02-24 MED ORDER — ONDANSETRON HCL 4 MG/2ML IJ SOLN
INTRAMUSCULAR | Status: AC
  Filled 2023-02-24: qty 2

## 2023-02-24 SURGICAL SUPPLY — 45 items
APPLIER CLIP 11 MED OPEN (CLIP) IMPLANT
APPLIER CLIP 9.375 MED OPEN (MISCELLANEOUS) ×1 IMPLANT
BIOPATCH RED 1 DISK 7.0 (GAUZE/BANDAGES/DRESSINGS) IMPLANT
BLADE HEX COATED 2.75 (ELECTRODE) ×2 IMPLANT
BLADE SURG 10 STRL SS (BLADE) ×2 IMPLANT
BLADE SURG 15 STRL LF DISP TIS (BLADE) ×2 IMPLANT
BNDG ELASTIC 6INX 5YD STR LF (GAUZE/BANDAGES/DRESSINGS) ×2 IMPLANT
CANISTER SUCT 1200ML W/VALVE (MISCELLANEOUS) ×2 IMPLANT
CHLORAPREP W/TINT 26 (MISCELLANEOUS) ×2 IMPLANT
CLIP APPLIE 11 MED OPEN (CLIP) IMPLANT
CLIP APPLIE 9.375 MED OPEN (MISCELLANEOUS) IMPLANT
COVER BACK TABLE 60X90IN (DRAPES) ×2 IMPLANT
COVER MAYO STAND STRL (DRAPES) ×2 IMPLANT
DERMABOND ADVANCED .7 DNX12 (GAUZE/BANDAGES/DRESSINGS) ×4 IMPLANT
DRAIN CHANNEL 19F RND (DRAIN) ×2 IMPLANT
DRAPE LAPAROSCOPIC ABDOMINAL (DRAPES) ×2 IMPLANT
DRAPE UTILITY XL STRL (DRAPES) ×2 IMPLANT
DRSG TEGADERM 4X4.75 (GAUZE/BANDAGES/DRESSINGS) IMPLANT
ELECT COATED BLADE 2.86 ST (ELECTRODE) ×2 IMPLANT
ELECT REM PT RETURN 9FT ADLT (ELECTROSURGICAL) ×1 IMPLANT
ELECTRODE REM PT RTRN 9FT ADLT (ELECTROSURGICAL) ×2 IMPLANT
EVACUATOR SILICONE 100CC (DRAIN) ×2 IMPLANT
GAUZE PAD ABD 8X10 STRL (GAUZE/BANDAGES/DRESSINGS) IMPLANT
GLOVE BIOGEL PI IND STRL 8 (GLOVE) ×2 IMPLANT
GLOVE ECLIPSE 8.0 STRL XLNG CF (GLOVE) ×2 IMPLANT
GOWN STRL REUS W/ TWL LRG LVL3 (GOWN DISPOSABLE) ×4 IMPLANT
GOWN STRL REUS W/ TWL XL LVL3 (GOWN DISPOSABLE) ×2 IMPLANT
NS IRRIG 1000ML POUR BTL (IV SOLUTION) IMPLANT
PACK BASIN DAY SURGERY FS (CUSTOM PROCEDURE TRAY) ×2 IMPLANT
PENCIL SMOKE EVACUATOR (MISCELLANEOUS) ×2 IMPLANT
PIN SAFETY STERILE (MISCELLANEOUS) ×2 IMPLANT
SLEEVE SCD COMPRESS KNEE MED (STOCKING) ×2 IMPLANT
SPIKE FLUID TRANSFER (MISCELLANEOUS) IMPLANT
SPONGE T-LAP 18X18 ~~LOC~~+RFID (SPONGE) ×2 IMPLANT
STAPLER SKIN PROX WIDE 3.9 (STAPLE) ×2 IMPLANT
SUT ETHILON 2 0 FS 18 (SUTURE) ×2 IMPLANT
SUT MNCRL AB 3-0 PS2 18 (SUTURE) ×2 IMPLANT
SUT SILK 2 0 PERMA HAND 18 BK (SUTURE) ×2 IMPLANT
SUT SILK 2 0 SH (SUTURE) IMPLANT
SUT VIC AB 3-0 54X BRD REEL (SUTURE) ×2 IMPLANT
SUT VICRYL 3-0 CR8 SH (SUTURE) ×4 IMPLANT
SYR CONTROL 10ML LL (SYRINGE) ×4 IMPLANT
TOWEL GREEN STERILE FF (TOWEL DISPOSABLE) ×4 IMPLANT
TUBE CONNECTING 20X1/4 (TUBING) ×2 IMPLANT
YANKAUER SUCT BULB TIP NO VENT (SUCTIONS) ×2 IMPLANT

## 2023-02-24 NOTE — Progress Notes (Signed)
Assisted Dr. Lissa Hoard with left, right, pectoralis, ultrasound guided block. Side rails up, monitors on throughout procedure. See vital signs in flow sheet. Tolerated Procedure well.

## 2023-02-24 NOTE — Discharge Instructions (Addendum)
[facility] [place] [phone]  MASTECTOMY: POST OP INSTRUCTIONS  Always review your discharge instruction sheet given to you by the facility where your surgery was performed. IF YOU HAVE DISABILITY OR FAMILY LEAVE FORMS, YOU MUST BRING THEM TO THE OFFICE FOR PROCESSING.   DO NOT GIVE THEM TO YOUR DOCTOR. A prescription for pain medication may be given to you upon discharge.  Take your pain medication as prescribed, if needed.  If narcotic pain medicine is not needed, then you may take acetaminophen (Tylenol) or ibuprofen (Advil) as needed. Take your usually prescribed medications unless otherwise directed. If you need a refill on your pain medication, please contact your pharmacy.  They will contact our office to request authorization.  Prescriptions will not be filled after 5pm or on week-ends. You should follow a light diet the first few days after arrival home, such as soup and crackers, etc.  Resume your normal diet the day after surgery. Most patients will experience some swelling and bruising on the chest and underarm.  Ice packs will help.  Swelling and bruising can take several days to resolve.  It is common to experience some constipation if taking pain medication after surgery.  Increasing fluid intake and taking a stool softener (such as Colace) will usually help or prevent this problem from occurring.  A mild laxative (Milk of Magnesia or Miralax) should be taken according to package instructions if there are no bowel movements after 48 hours. Unless discharge instructions indicate otherwise, leave your bandage dry and in place until your next appointment in 3-5 days.  You may take a limited sponge bath.  No tube baths or showers until the drains are removed.  You may have steri-strips (small skin tapes) in place directly over the incision.  These strips should be left on the skin for 7-10 days.  If your surgeon used skin glue on the incision, you may shower in 24 hours.   The glue will flake off over the next 2-3 weeks.  Any sutures or staples will be removed at the office during your follow-up visit. DRAINS:  If you have drains in place, it is important to keep a list of the amount of drainage produced each day in your drains.  Before leaving the hospital, you should be instructed on drain care.  Call our office if you have any questions about your drains. ACTIVITIES:  You may resume regular (light) daily activities beginning the next day--such as daily self-care, walking, climbing stairs--gradually increasing activities as tolerated.  You may have sexual intercourse when it is comfortable.  Refrain from any heavy lifting or straining until approved by your doctor. You may drive when you are no longer taking prescription pain medication, you can comfortably wear a seatbelt, and you can safely maneuver your car and apply brakes. RETURN TO WORK:  __________________________________________________________ You should see your doctor in the office for a follow-up appointment approximately 3-5 days after your surgery.  Your doctor's nurse will typically make your follow-up appointment when she calls you with your pathology report.  Expect your pathology report 2-3 business days after your surgery.  You may call to check if you do not hear from us after three days.   OTHER INSTRUCTIONS: ______________________________________________________________________________________________ ____________________________________________________________________________________________ WHEN TO CALL YOUR DOCTOR: Fever over 101.0 Nausea and/or vomiting Extreme swelling or bruising Continued bleeding from incision. Increased pain, redness, or drainage from the incision. The clinic staff is available to answer your questions during regular business hours.  Please don't hesitate   to call and ask to speak to one of the nurses for clinical concerns.  If you have a medical emergency, go to the  nearest emergency room or call 911.  A surgeon from [facility] is always on call at the hospital. [address] [place], [place], [place]  [address] ? [address], [place], [place]   [phone] ? [phone] ? FAX [phone] Web site: [employer] Instructions  Activity: Get plenty of rest for the remainder of the day. A responsible individual must stay with you for 24 hours following the procedure.  For the next 24 hours, DO NOT: -Drive a car -Paediatric nurse -Drink alcoholic beverages -Take any medication unless instructed by your physician -Make any legal decisions or sign important papers.  Meals: Start with liquid foods such as gelatin or soup. Progress to regular foods as tolerated. Avoid greasy, spicy, heavy foods. If nausea and/or vomiting occur, drink only clear liquids until the nausea and/or vomiting subsides. Call your physician if vomiting continues.  Special Instructions/Symptoms: Your throat may feel dry or sore from the anesthesia or the breathing tube placed in your throat during surgery. If this causes discomfort, gargle with warm salt water. The discomfort should disappear within 24 hours.  If you had a scopolamine patch placed behind your ear for the management of post- operative nausea and/or vomiting:  1. The medication in the patch is effective for 72 hours, after which it should be removed.  Wrap patch in a tissue and discard in the trash. Wash hands thoroughly with soap and water. 2. You may remove the patch earlier than 72 hours if you experience unpleasant side effects which may include dry mouth, dizziness or visual disturbances. 3. Avoid touching the patch. Wash your hands with soap and water after contact with the patch.

## 2023-02-24 NOTE — Transfer of Care (Signed)
Immediate Anesthesia Transfer of Care Note  Patient: Alison [name]  Procedure(s) Performed: LEFT SIMPLE MASTECTOMY AND RIGHT RISK REDUCING MASTECTOMY (Bilateral: Breast)  Patient Location: PACU  Anesthesia Type:GA combined with regional for post-op pain  Level of Consciousness: drowsy and patient cooperative  Airway & Oxygen Therapy: Patient Spontanous Breathing and Patient connected to face mask oxygen  Post-op Assessment: Report given to RN and Post -op Vital signs reviewed and stable  Post vital signs: Reviewed and stable  Last Vitals:  Vitals Value Taken Time  BP    Temp    Pulse 71 02/24/23 1215  Resp    SpO2 99 % 02/24/23 1215  Vitals shown include unfiled device data.  Last Pain:  Vitals:   02/24/23 0903  TempSrc: Temporal  PainSc: 0-No pain      Patients Stated Pain Goal: 6 (02/24/23 0903)  Complications: No notable events documented.

## 2023-02-24 NOTE — Anesthesia Procedure Notes (Signed)
Anesthesia Regional Block: Pectoralis block   Pre-Anesthetic Checklist: , timeout performed,  Correct Patient, Correct Site, Correct Laterality,  Correct Procedure, Correct Position, site marked,  Risks and benefits discussed,  Surgical consent,  Pre-op evaluation,  At surgeon's request and post-op pain management  Laterality: Right and Left  Prep: chloraprep       Needles:   Needle Type: Stimiplex     Needle Length: 9cm      Additional Needles:   Procedures:,,,, ultrasound used (permanent image in chart),,    Narrative:  Start time: 02/24/2023 9:20 AM End time: 02/24/2023 9:42 AM Injection made incrementally with aspirations every 5 mL.  Performed by: Personally  Anesthesiologist: [name], MD  Additional Notes: BP cuff, SpO2 and EKG monitors applied. Sedation begun.  Anesthetic injected incrementally, slowly, and after neg aspirations under direct ultrasound guidance. Good fascial spread noted. Patient tolerated well.

## 2023-02-24 NOTE — Anesthesia Postprocedure Evaluation (Signed)
Anesthesia Post Note  Patient: Alison [name]  Procedure(s) Performed: LEFT SIMPLE MASTECTOMY AND RIGHT RISK REDUCING MASTECTOMY (Bilateral: Breast)     Patient location during evaluation: PACU Anesthesia Type: General Level of consciousness: sedated and patient cooperative Pain management: pain level controlled Vital Signs Assessment: post-procedure vital signs reviewed and stable Respiratory status: spontaneous breathing Cardiovascular status: stable Anesthetic complications: no   No notable events documented.  Last Vitals:  Vitals:   02/24/23 1330 02/24/23 1356  BP: 121/65 131/68  Pulse: 80 73  Resp: 17 18  Temp:  (!) 36.2 C  SpO2: 98% 98%    Last Pain:  Vitals:   02/24/23 1356  TempSrc:   PainSc: 1                  [name]

## 2023-02-24 NOTE — Interval H&P Note (Signed)
History and Physical Interval Note:  02/24/2023 8:25 AM  Gardiner Sleeper  has presented today for surgery, with the diagnosis of LEFT BREAST CANCER.  The various methods of treatment have been discussed with the patient and family. After consideration of risks, benefits and other options for treatment, the patient has consented to  Procedure(s) with comments: LEFT SIMPLE MASTECTOMY AND RIGHT RISK REDUCING MASTECTOMY (Bilateral) - PEC BLOCK as a surgical intervention.  The patient's history has been reviewed, patient examined, no change in status, stable for surgery.  I have reviewed the patient's chart and labs.  Questions were answered to the patient's satisfaction.    The surgical and non surgical options have been discussed with the patient.  Risks of surgery include bleeding,  Infection,  Flap necrosis,  Tissue loss,  Chronic pain, death, Numbness,  And the need for additional procedures.  Reconstruction options also have been discussed with the patient as well.  The patient agrees to proceed.  Temiloluwa [place] A Ewell Benassi

## 2023-02-24 NOTE — H&P (Signed)
History of Present Illness: Alison [name] is a 77 y.o. female who is seen today as an office consultation for evaluation of Breast Cancer  Patient presents to the multidisciplinary breast clinic with a new diagnosis of left breast cancer detected on mammography. She was noted to have a cluster of calcifications mass central left breast core biopsy proven to be invasive ductal carcinoma ER positive, PR positive, HER2/neu negative with a KI of 30%. 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 Patient denies any history of breast pain, mass or discharge.  Review of Systems: A complete review of systems was obtained from the patient. I have reviewed this information and discussed as appropriate with the patient. See HPI as well for other ROS.    Medical History: Past Medical History:  Diagnosis Date  Asthma, unspecified asthma severity, unspecified whether complicated, unspecified whether persistent (HHS-HCC)  GERD (gastroesophageal reflux disease)  History of cancer  Hypertension   Patient Active Problem List  Diagnosis  Malignant neoplasm of lower-outer quadrant of left breast of female, estrogen receptor positive (CMS/HHS-HCC)  Gastroesophageal reflux disease  Essential hypertension, benign   Past Surgical History:  Procedure Laterality Date  Breast Lumpectomy with radioactive seed localization Left 07/12/2014  bilateral salpingo oopherectomy Bilateral 03/19/2015  JOINT REPLACEMENT Right  Total Hip Arthroplasty 2005  LAPAROSCOPIC BOWEL RESECTION N/A  2017  Nissen Fundoplication N/A  2006    Allergies  Allergen Reactions  Morphine Dizziness, Other (See Comments), Hallucination, Itching, Nausea And Vomiting and Rash  Latex Rash  Latex bandaids  Adhesive Rash  Sulfa (Sulfonamide Antibiotics) Rash   Current Outpatient Medications on  File Prior to Visit  Medication Sig Dispense Refill  aspirin 81 MG EC tablet Take 81 mg by mouth once daily  calcium carbonate-vitamin D3 500 mg-10 mcg (400 unit) Chew Take 2 tablets by mouth once daily  cyclobenzaprine (FLEXERIL) 10 MG tablet Take 10 mg by mouth 3 (three) times daily as needed  metoprolol SUCCinate (TOPROL-XL) 50 MG XL tablet Take 1 tablet by mouth once daily  ondansetron (ZOFRAN) 4 MG tablet Take 4 mg by mouth every 8 (eight) hours as needed  pantoprazole (PROTONIX) 20 MG DR tablet Take 1 tablet by mouth 2 (two) times daily  docusate (COLACE) 50 MG capsule Take 150 mg by mouth once daily   No current facility-administered medications on file prior to visit.   Family History  Problem Relation Age of Onset  Coronary Artery Disease (Blocked arteries around heart) Father    Social History   Tobacco Use  Smoking Status Former  Types: Cigarettes  Smokeless Tobacco Never  Tobacco Comments  Quit smoking 08/10/1977    Social History   Socioeconomic History  Marital status: Married  Tobacco Use  Smoking status: Former  Types: Cigarettes  Smokeless tobacco: Never  Tobacco comments:  Quit smoking 08/10/1977  Vaping Use  Vaping status: Never Used  Substance and Sexual Activity  Alcohol use: Yes  Alcohol/week: 1.0 standard drink of alcohol  Types: 1 Glasses of wine per week  Comment: Gin/wine once a day  Drug use: Never   Social Drivers of Corporate investment banker Strain: Low Risk (05/25/2022)  Received from [facility]  Overall Financial Resource Strain (CARDIA)  Difficulty of Paying Living Expenses: Not hard at all  Food Insecurity: No Food Insecurity (05/25/2022)  Received from [facility]  Hunger Vital Sign  Worried About Programme researcher, broadcasting/film/video in the Last Year: Never true  Ran Out of Food in the Last Year: Never true  Transportation Needs: No Transportation Needs (05/25/2022)  Received from [facility] - Transportation  Lack of  Transportation (Medical): No  Lack of Transportation (Non-Medical): No  Physical Activity: Insufficiently Active (05/25/2022)  Received [facility]  Exercise Vital Sign  Days of Exercise per Week: 3 days  Minutes of Exercise per Session: 20 min  Stress: No Stress Concern Present (05/25/2022)  Received from [facility] - Occupational Stress Questionnaire  Feeling of Stress : Not at all  Social Connections: Socially Integrated (05/25/2022)  Received from [facility]  Social Network  How would you rate your social network (family, work, friends)?: Good participation with social networks  Housing Stability: Low Risk (05/25/2022)  Received from [facility] Stability Vital Sign  Unable to Pay for Housing in the Last Year: No  Number of Places Lived in the Last Year: 1  Unstable Housing in the Last Year: No   Objective:  There were no vitals filed for this visit.  There is no height or weight on file to calculate BMI.  Physical Exam Exam conducted with a chaperone present.  HENT:  Head: Normocephalic.  Cardiovascular:  Rate and Rhythm: Normal rate.  Pulmonary:  Effort: Pulmonary effort is normal.  Chest:  Breasts: Right: Normal.   Comments: Left breast shows multiple scars. Significant change from previous radiation therapy noted. No evidence of left axillary adenopathy. Musculoskeletal:  Comments: Decreased range of motion of left shoulder to roughly 70 to 80 degrees of abduction  Lymphadenopathy:  Upper Body:  Right upper body: No supraclavicular or axillary adenopathy.  Left upper body: No supraclavicular or axillary adenopathy.  Neurological:  General: No focal deficit present.  Mental Status: She is alert.  Psychiatric:  Mood and Affect: Mood normal.     Labs, Imaging and Diagnostic Testing: Left breast shows invasive ductal carcinoma ER positive, PR positive, HER2/neu negative with a KI of 30%. Overall  size is 2.2 cm.  Assessment and Plan:   Diagnoses and all orders for this visit:  Malignant neoplasm of lower-outer quadrant of left breast of female, estrogen receptor positive (CMS/HHS-HCC)   Options of treatment discussed today. Pros and cons of surgery reviewed as well as surgical options. She would be best served by left simple mastectomy. She would like to proceed in this direction.  She also desires risk reducing mastectomy on the right.  Reviewed the pros and cons of this approach as well as increased potential complications.  Discussed the potential benefit of risk reduction.  She understands there is an increased risk of complication.  Given her age and low grade of tumor, I do not see much benefit in the sentinel lymph node mapping. She also has significant left shoulder arthritis and this could complicate that. She will require left shoulder replacement in the near future. We discussed the pros and cons of lymph node mapping and opted to omit that given the low probability of this changing her treatment and having no impact on survival. Reviewed risk of bleeding, infection, flap necrosis, cosmetic outcome, impact on shoulder pathology and lymphedema.   Hayden Rasmussen, MD

## 2023-02-24 NOTE — Anesthesia Procedure Notes (Signed)
Procedure Name: Intubation Date/Time: 02/24/2023 10:03 AM  Performed by: Thornell Mule, CRNAPre-anesthesia Checklist: Patient identified, Emergency Drugs available, Suction available and Patient being monitored Patient Re-evaluated:Patient Re-evaluated prior to induction Oxygen Delivery Method: Circle system utilized Preoxygenation: Pre-oxygenation with 100% oxygen Induction Type: IV induction Ventilation: Mask ventilation without difficulty Laryngoscope Size: Miller and 3 Grade View: Grade II Tube type: Oral Tube size: 7.0 mm Number of attempts: 1 Airway Equipment and Method: Stylet and Oral airway Placement Confirmation: ETT inserted through vocal cords under direct vision, positive ETCO2 and breath sounds checked- equal and bilateral Secured at: 21 cm Tube secured with: Tape Dental Injury: Teeth and Oropharynx as per pre-operative assessment

## 2023-02-24 NOTE — Op Note (Signed)
Preoperative diagnosis: Stage I left breast cancer with previous history of left breast cancer 20 years ago overlapping sites  Postoperative diagnosis: Same  Procedure   left simple mastectomy, risk-reducing right simple mastectomy  Surgeon: Harriette Bouillon, MD  Anesthesia: General With bilateral pectoral blocks  Drains: 2 #119 round  EBL: Minimal  Specimen: Bilateral breast to pathology  Indications for procedure: The patient is a 77 year old female with a past history of stage I left breast cancer treated 20 years ago with left breast lumpectomy, sentinel lymph node mapping, radiation therapy and chemotherapy.  She has developed a new density in the left breast core biopsy proven to be invasive ductal carcinoma.  She desired bilateral mastectomies as a treatment option.  She had no interest in reconstruction.  She was counseled on the pros and cons of risk reducing mastectomy especially at her age.The surgical and non surgical options have been discussed with the patient.  Risks of surgery include bleeding,  Infection,  Flap necrosis,  Tissue loss,  Chronic pain, death, Numbness,  And the need for additional procedures.  Reconstruction options also have been discussed with the patient as well.  Reviewed lymph node mapping or dissection.  She does have significant left shoulder arthritis and is in need of a left shoulder replacement.  She opted out of lymph node dissection and/or biopsy with concern that lymphedema would be a problem for her.  We did review her risk lymphedema with lymph node intervention is at least 30% or higher.  Also reviewed that mortality and long-term survival will not be impacted by lymph node status.  Her imaging was normal which is reassuring and I reviewed the [employer] trial data with her as well.  The patient agrees to proceed.     Description of procedure: The patient was met in the holding area and questions were answered.  Pectoral blocks were administered by  anesthesia.  She was taken back to the operating room.  She was placed supine upon the operating table.  Her left shoulder was left at about 45 to 50 degrees of abduction.  After induction of general anesthesia, both breasts were prepped and draped in a sterile fashion and timeout performed.  The right side was done first.  A fishmouth incision was made above and below the nipple areolar complex.  Skin flaps were taken to the clavicle, sternum and inferior mammary fold.  We dissected out beyond the lateral attachments.  The breast was then dissected in a medial to lateral fashion taken the pectoralis fascia with it until the lateral attachments were identified and the breast was amputated.  A single stitch was placed to mark superior margin.  TXA soaked sponge was placed after irrigation and ensuring hemostasis.  The left breast was approached in a similar fashion.  A fishmouth incision was made above and below the nipple areolar complex.  This again was taken to the clavicle, sternum and inferior mammary fold.  The breast was then dissected in a medial to lateral fashion until lateral attachments identified and divided.  Faxitron image of the breast showed the clip to be in the specimen.  Skin flaps were viable.  Irrigation was used and a TXA soaked sponge was placed.  After ensuring hemostasis of both sides, both sides were inspected and the flaps are viable with no necrosis or signs of ischemia.  19 round drain was placed on each side through separate stab incisions and both secured with #1 nylon.  After ensuring hemostasis, both were  closed with a deep layer 3-0 Vicryl and 3-0 Monocryl in a subcuticular fashion.  Drains placed to bulb suction with good seal.  Breast binder placed.  All counts were found to be correct.  The patient was then awoke extubated taken to recovery in satisfactory condition.

## 2023-02-25 ENCOUNTER — Encounter (HOSPITAL_BASED_OUTPATIENT_CLINIC_OR_DEPARTMENT_OTHER): Payer: Self-pay | Admitting: Surgery

## 2023-02-26 LAB — SURGICAL PATHOLOGY

## 2023-02-27 ENCOUNTER — Encounter: Payer: Self-pay | Admitting: Surgery

## 2023-03-01 ENCOUNTER — Encounter: Payer: Self-pay | Admitting: *Deleted

## 2023-03-01 ENCOUNTER — Telehealth: Payer: Self-pay | Admitting: *Deleted

## 2023-03-01 NOTE — Telephone Encounter (Signed)
Ordered oncotype per Dr. Iruku. Sent requisition to pathology and exact sciences. 

## 2023-03-08 ENCOUNTER — Encounter (HOSPITAL_COMMUNITY): Payer: Self-pay

## 2023-03-08 ENCOUNTER — Encounter: Payer: Self-pay | Admitting: *Deleted

## 2023-03-08 ENCOUNTER — Telehealth: Payer: Self-pay | Admitting: *Deleted

## 2023-03-08 NOTE — Telephone Encounter (Signed)
Received oncotype results of 27/16%. Dr. Al Pimple appt 1/14.

## 2023-03-23 ENCOUNTER — Inpatient Hospital Stay
Payer: No Typology Code available for payment source | Attending: Hematology and Oncology | Admitting: Hematology and Oncology

## 2023-03-23 VITALS — BP 133/71 | HR 73 | Temp 97.8°F | Resp 16 | Wt 151.1 lb

## 2023-03-23 DIAGNOSIS — Z87891 Personal history of nicotine dependence: Secondary | ICD-10-CM | POA: Diagnosis not present

## 2023-03-23 DIAGNOSIS — Z923 Personal history of irradiation: Secondary | ICD-10-CM | POA: Insufficient documentation

## 2023-03-23 DIAGNOSIS — Z79899 Other long term (current) drug therapy: Secondary | ICD-10-CM | POA: Insufficient documentation

## 2023-03-23 DIAGNOSIS — Z9013 Acquired absence of bilateral breasts and nipples: Secondary | ICD-10-CM | POA: Insufficient documentation

## 2023-03-23 DIAGNOSIS — Z170 Estrogen receptor positive status [ER+]: Secondary | ICD-10-CM | POA: Diagnosis not present

## 2023-03-23 DIAGNOSIS — Z7982 Long term (current) use of aspirin: Secondary | ICD-10-CM | POA: Insufficient documentation

## 2023-03-23 DIAGNOSIS — C50512 Malignant neoplasm of lower-outer quadrant of left female breast: Secondary | ICD-10-CM | POA: Diagnosis present

## 2023-03-23 NOTE — Progress Notes (Signed)
 [facility] CONSULT NOTE  Patient Care Team: Job Browning, PA-C as PCP - General (Physician Assistant) Glean Stephane BROCKS, RN as Oncology Nurse Navigator Tyree Nanetta SAILOR, RN as Oncology Nurse Navigator Vanderbilt Ned, MD as Consulting Physician (General Surgery) Loretha Ash, MD as Consulting Physician (Hematology and Oncology) Shannon Agent, MD as Consulting Physician (Radiation Oncology)  CHIEF COMPLAINTS/PURPOSE OF CONSULTATION:  Newly diagnosed breast cancer  HISTORY OF PRESENTING ILLNESS:  Alison [name] 78 y.o. female is here because of recent diagnosis of left breast cancer  I reviewed her records extensively and collaborated the history with the patient.  SUMMARY OF ONCOLOGIC HISTORY: Oncology History  Malignant neoplasm of lower-outer quadrant of left breast of female, estrogen receptor positive (HCC)  01/27/2023 Mammogram   Screening / diagnostic mammogram showed 2 x 1.9 x 1.4 cm irregular mass in the left breast highly suggestive of malignancy, ultrasound is recommended to further evaluate mass and determine modality of biopsy.   01/27/2023 Pathology Results   Left breast needle core biopsy at 5:00 showed invasive mammary carcinoma, overall grade 2, prognostic showed ER 90% positive strong staining PR 70% positive moderate to strong staining, Ki-67 of 30%, HER2 1+   02/01/2023 Initial Diagnosis   Malignant neoplasm of lower-outer quadrant of left breast of female, estrogen receptor positive (HCC)   02/03/2023 Cancer Staging   Staging form: Breast, AJCC 8th Edition - Clinical stage from 02/03/2023: Stage IB (cT2, cN0, cM0, G2, ER+, PR+, HER2-) - Signed by Loretha Ash, MD on 02/03/2023 Stage prefix: Initial diagnosis Histologic grading system: 3 grade system   02/16/2023 Genetic Testing   Negative genetic testing on the BRCAPlus and CancerNext-Expanded+RNAinsight panel.  The report date for both tests is 02/16/2023.  The CancerNext-Expanded gene  panel offered by [facility] and includes sequencing, rearrangement, and RNA analysis for the following 76 genes: AIP, ALK, APC, ATM, AXIN2, BAP1, BARD1, BMPR1A, BRCA1, BRCA2, BRIP1, CDC73, CDH1, CDK4, CDKN1B, CDKN2A, CEBPA, CHEK2, CTNNA1, DDX41, DICER1, ETV6, FH, FLCN, GATA2, LZTR1, MAX, MBD4, MEN1, MET, MLH1, MSH2, MSH3, MSH6, MUTYH, NF1, NF2, NTHL1, PALB2, PHOX2B, PMS2, POT1, PRKAR1A, PTCH1, PTEN, RAD51C, RAD51D, RB1, RET, RUNX1, SDHA, SDHAF2, SDHB, SDHC, SDHD, SMAD4, SMARCA4, SMARCB1, SMARCE1, STK11, SUFU, TMEM127, TP53, TSC1, TSC2, VHL, and WT1 (sequencing and deletion/duplication); EGFR, HOXB13, KIT, MITF, PDGFRA, POLD1, and POLE (sequencing only); EPCAM and GREM1 (deletion/duplication only).      The patient, with a history of stage 0 breast cancer in the left breast treated with lumpectomy and radiation in 2015, and atypical ductal hyperplasia in 2016, presents for discussion of treatment options following a recent diagnosis of invasive ductal cancer in the left breast.   Discussed the use of AI scribe software for clinical note transcription with the patient, who gave verbal consent to proceed.  History of Present Illness    The patient, with a history of breast cancer, recently underwent a bilateral mastectomy. She reports a generally good recovery, but expresses concern about three blisters that formed above the incision site on the right side. The patient describes these blisters as being the size of her finger and clustered together. She reports that the blisters have dried up and are not infected, but the area remains a bit raw.   The patient's oncotype score came back slightly above the borderline, indicating a higher risk of cancer recurrence. The patient expresses dread at the prospect of chemotherapy, which is one of the treatment options discussed. She is also considering hormone therapy (anti-estrogen), which she had previously  tried but discontinued due to side effects. The  patient is aware of the importance of this therapy in reducing the risk of cancer recurrence and is open to trying other medications besides tamoxifen , which she had taken previously.  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   Breast cancer (HCC) 03/2013   Left w/lumpectomy   Contact lens/glasses fitting    wears contacts or glasses   Dysrhythmia    irregular heart beat   Esophageal stricture    Esophagitis    Family history of breast cancer    GERD (gastroesophageal reflux disease)    H/O adenomatous polyp of colon    Hiatal hernia    Internal hemorrhoids    Macular degeneration    Left eye   Migraine headache    Personal history of malignant neoplasm of breast 02/03/2023   Polyposis of colon 02/03/2023   PONV (postoperative nausea and vomiting)    Radiation 05/16/13-06/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;  Service: General;  Laterality: Left;   BREAST LUMPECTOMY WITH RADIOACTIVE SEED LOCALIZATION Left 07/12/2014   Procedure: LEFT BREAST LUMPECTOMY WITH RADIOACTIVE SEED LOCALIZATION;  Surgeon: Krystal Russell, MD;  Location: [facility];  Service: General;  Laterality: Left;   COLONOSCOPY  08/08/2019   poor prep, repeat colon 6/30   LAPAROSCOPIC BILATERAL SALPINGO OOPHERECTOMY Bilateral 03/19/2015   Procedure: LAPAROSCOPIC BILATERAL SALPINGO OOPHORECTOMY WITH EXTENSIVE LYSIS OF ABDOMINAL AND PELVIC ADHESIONS AND  PELVIC VICTORINE;  Surgeon: Curlee VEAR Guan, MD;  Location: [facility];  Service: Gynecology;  Laterality: Bilateral;   NISSEN FUNDOPLICATION   2006   OVARIAN CYST REMOVAL     SIMPLE MASTECTOMY WITH AXILLARY SENTINEL NODE BIOPSY Bilateral 02/24/2023   Procedure: LEFT SIMPLE MASTECTOMY AND RIGHT RISK REDUCING MASTECTOMY;  Surgeon: Vanderbilt Ned, MD;  Location: [facility];  Service: General;  Laterality: Bilateral;  PEC BLOCK   TONSILLECTOMY     TOTAL HIP ARTHROPLASTY Right 2005   UPPER GI ENDOSCOPY      SOCIAL HISTORY: Social History   Socioeconomic History   Marital status: Married    Spouse name: Sheri   Number of children: 0   Years of education: 16   Highest education level: Not on file  Occupational History   Occupation: PE TEACHER     Employer: [employer]    Comment: RETIRED   Tobacco Use   Smoking status: Former    Current packs/day: 0.00    Types: Cigarettes    Start date: 08/10/1972    Quit date: 08/10/1977    Years since quitting: 45.6   Smokeless tobacco: Never   Tobacco comments:    She smoked for 5 years in her early 6s.    Vaping Use   Vaping status: Never Used  Substance and Sexual Activity   Alcohol use: Yes    Alcohol/week: 5.0 standard drinks of alcohol    Types: 5 Standard drinks or equivalent per week   Drug use: No   Sexual activity: Yes  Partners: Female  Other Topics Concern   Not on file  Social History Narrative   Marital Status:  Single Chief Financial Officer)    Children:  None    Pets:  None   Living Situation: Lives with her significant other Hospital Doctor)    Occupation: Retired Animator)    Education: Engineer, Maintenance (it) ([employer])    Tobacco Use/Exposure:  She smoked for 5 years in her early 30s.     Alcohol Use:  Moderate   Drug Use:  None   Diet:  Regular   Exercise:  Active Life    Hobbies:  Fishing, [place], Golf             Social Drivers of Health   Financial Resource Strain: Low Risk  (05/25/2022)   Received from [facility]   Overall Financial Resource Strain (CARDIA)    Difficulty of Paying Living Expenses: Not hard at all  Food Insecurity: No  Food Insecurity (02/03/2023)   Hunger Vital Sign    Worried About Running Out of Food in the Last Year: Never true    Ran Out of Food in the Last Year: Never true  Transportation Needs: No Transportation Needs (02/03/2023)   PRAPARE - Administrator, Civil Service (Medical): No    Lack of Transportation (Non-Medical): No  Physical Activity: Insufficiently Active (05/25/2022)   Received from [facility]   Exercise Vital Sign    Days of Exercise per Week: 3 days    Minutes of Exercise per Session: 20 min  Stress: No Stress Concern Present (05/25/2022)   Received from [facility] - Occupational Stress Questionnaire    Feeling of Stress : Not at all  Social Connections: Socially Integrated (05/25/2022)   [facility]   Social Network    How would you rate your social network (family, work, friends)?: Good participation with social networks  Intimate Partner Violence: Not At Risk (02/03/2023)   Humiliation, Afraid, Rape, and Kick questionnaire    Fear of Current or Ex-Partner: No    Emotionally Abused: No    Physically Abused: No    Sexually Abused: No    FAMILY HISTORY: Family History  Problem Relation Age of Onset   Lung cancer Mother 19       smoker   Heart attack Father    Breast cancer Maternal Aunt 45   Colon cancer Neg Hx    Esophageal cancer Neg Hx    Rectal cancer Neg Hx    Stomach cancer Neg Hx     ALLERGIES:  is allergic to adhesive [tape], morphine, and sulfonamide derivatives.  MEDICATIONS:  Current Outpatient Medications  Medication Sig Dispense Refill   aspirin 81 MG tablet Take 81 mg by mouth daily.     Calcium Carb-Cholecalciferol (CALCIUM + D3 PO) Take 2 tablets by mouth daily.      meclizine  (ANTIVERT ) 25 MG tablet Take 1 tablet (25 mg total) by mouth 3 (three) times daily as needed for dizziness. 90 tablet 1   metoprolol  succinate (TOPROL -XL) 50 MG 24 hr tablet TAKE ONE TABLET BY  MOUTH DAILY     Milk Thistle 1000 MG CAPS Take 2 tablets by mouth daily.      Omega-3 Fatty Acids (FISH OIL) 1000 MG CAPS Take 3 capsules by mouth daily.      oxyCODONE  (OXY IR/ROXICODONE ) 5 MG immediate release tablet Take 1 tablet (5 mg total) by mouth every 6 (six) hours as  needed for severe pain (pain score 7-10). 15 tablet 0   pantoprazole  (PROTONIX ) 20 MG tablet TAKE ONE TABLET BY MOUTH TWICE A DAY 180 tablet 2   SUMAtriptan  (IMITREX ) 20 MG/ACT nasal spray Place 1 spray (20 mg total) into the nose every 2 (two) hours as needed for migraine. 8 Inhaler 11   TURMERIC PO Take 2 tablets by mouth daily.      No current facility-administered medications for this visit.    REVIEW OF SYSTEMS:   Constitutional: Denies fevers, chills or abnormal night sweats Eyes: Denies blurriness of vision, double vision or watery eyes Ears, nose, mouth, throat, and face: Denies mucositis or sore throat Respiratory: Denies cough, dyspnea or wheezes Cardiovascular: Denies palpitation, chest discomfort or lower extremity swelling Gastrointestinal:  Denies nausea, heartburn or change in bowel habits Skin: Denies abnormal skin rashes Lymphatics: Denies new lymphadenopathy or easy bruising Neurological:Denies numbness, tingling or new weaknesses Behavioral/Psych: Mood is stable, no new changes  Breast: Denies any palpable lumps or discharge All other systems were reviewed with the patient and are negative.  PHYSICAL EXAMINATION: ECOG PERFORMANCE STATUS: 0 - Asymptomatic  Vitals:   03/23/23 0904  BP: 133/71  Pulse: 73  Resp: 16  Temp: 97.8 F (36.6 C)  SpO2: 100%   Filed Weights   03/23/23 0904  Weight: 151 lb 1.6 oz (68.5 kg)    GENERAL:alert, no distress and comfortable Bilateral mastectomy site healed well. Right side axillary incision with small area of dehiscence, pale granulation tissue, no evidence of infection.  LABORATORY DATA:  I have reviewed the data as listed Lab Results  Component  Value Date   WBC 5.6 02/03/2023   HGB 13.8 02/03/2023   HCT 41.6 02/03/2023   MCV 91.0 02/03/2023   PLT 310 02/03/2023   Lab Results  Component Value Date   NA 141 02/03/2023   K 4.3 02/03/2023   CL 105 02/03/2023   CO2 29 02/03/2023    RADIOGRAPHIC STUDIES: I have personally reviewed the radiological reports and agreed with the findings in the report.  ASSESSMENT AND PLAN:   This is a very pleasant 78 year old postmenopausal female patient with history of left breast DCIS, ER/PR positive status post lumpectomy and XRT in 2015 now presents with new diagnosis of left breast IDC to the breast MDC.   Invasive Ductal Carcinoma, Left Breast New diagnosis of invasive ductal carcinoma in the left breast, estrogen receptor positive, progesterone receptor positive, and HER2 negative. Previous history of stage zero breast cancer in the left breast in 2015 treated with lumpectomy and radiation.  Bilateral mastectomy performed with two areas of cancer identified on the left side (2.8 cm and 2 mm). Margins clear. Oncotype score of 27, indicating a higher risk of distant recurrence. Discussed the benefits and risks of chemotherapy and anti-estrogen therapy. -Consider chemotherapy with TC (4 cycles over 12 weeks) to reduce recurrence risk. -Start anti-estrogen therapy regardless of decision on chemotherapy.  Post-Surgical Wound Healing Noted area of blisters and raw skin above incision site. Likely due to thin skin and trauma. -Continue to monitor wound healing. -Consider using dressings that promote skin closure.  General Health Maintenance / Followup Plans -Consider blood tests to detect tumor DNA in the blood, pending FDA approval. -Follow-up appointment scheduled for Friday, March 26, 2023.  Time spent: 40 min All questions were answered. The patient knows to call the clinic with any problems, questions or concerns.    Amber Stalls, MD 03/23/23

## 2023-03-23 NOTE — Progress Notes (Signed)
 [facility] CONSULT NOTE  Patient Care Team: Job Browning, PA-C as PCP - General (Physician Assistant) Glean Stephane BROCKS, RN as Oncology Nurse Navigator Tyree Nanetta SAILOR, RN as Oncology Nurse Navigator Vanderbilt Ned, MD as Consulting Physician (General Surgery) Loretha Ash, MD as Consulting Physician (Hematology and Oncology) Shannon Agent, MD as Consulting Physician (Radiation Oncology)  CHIEF COMPLAINTS/PURPOSE OF CONSULTATION:  Newly diagnosed breast cancer  HISTORY OF PRESENTING ILLNESS:  Alison [name] 78 y.o. female is here because of recent diagnosis of left breast cancer  I reviewed her records extensively and collaborated the history with the patient.  SUMMARY OF ONCOLOGIC HISTORY: Oncology History  Malignant neoplasm of lower-outer quadrant of left breast of female, estrogen receptor positive (HCC)  01/27/2023 Mammogram   Screening / diagnostic mammogram showed 2 x 1.9 x 1.4 cm irregular mass in the left breast highly suggestive of malignancy, ultrasound is recommended to further evaluate mass and determine modality of biopsy.   01/27/2023 Pathology Results   Left breast needle core biopsy at 5:00 showed invasive mammary carcinoma, overall grade 2, prognostic showed ER 90% positive strong staining PR 70% positive moderate to strong staining, Ki-67 of 30%, HER2 1+   02/01/2023 Initial Diagnosis   Malignant neoplasm of lower-outer quadrant of left breast of female, estrogen receptor positive (HCC)   02/03/2023 Cancer Staging   Staging form: Breast, AJCC 8th Edition - Clinical stage from 02/03/2023: Stage IB (cT2, cN0, cM0, G2, ER+, PR+, HER2-) - Signed by Loretha Ash, MD on 02/03/2023 Stage prefix: Initial diagnosis Histologic grading system: 3 grade system   02/16/2023 Genetic Testing   Negative genetic testing on the BRCAPlus and CancerNext-Expanded+RNAinsight panel.  The report date for both tests is 02/16/2023.  The CancerNext-Expanded gene  panel offered by [facility] and includes sequencing, rearrangement, and RNA analysis for the following 76 genes: AIP, ALK, APC, ATM, AXIN2, BAP1, BARD1, BMPR1A, BRCA1, BRCA2, BRIP1, CDC73, CDH1, CDK4, CDKN1B, CDKN2A, CEBPA, CHEK2, CTNNA1, DDX41, DICER1, ETV6, FH, FLCN, GATA2, LZTR1, MAX, MBD4, MEN1, MET, MLH1, MSH2, MSH3, MSH6, MUTYH, NF1, NF2, NTHL1, PALB2, PHOX2B, PMS2, POT1, PRKAR1A, PTCH1, PTEN, RAD51C, RAD51D, RB1, RET, RUNX1, SDHA, SDHAF2, SDHB, SDHC, SDHD, SMAD4, SMARCA4, SMARCB1, SMARCE1, STK11, SUFU, TMEM127, TP53, TSC1, TSC2, VHL, and WT1 (sequencing and deletion/duplication); EGFR, HOXB13, KIT, MITF, PDGFRA, POLD1, and POLE (sequencing only); EPCAM and GREM1 (deletion/duplication only).      The patient, with a history of stage 0 breast cancer in the left breast treated with lumpectomy and radiation in 2015, and atypical ductal hyperplasia in 2016, presents for new diagnosis of left breast cancer.  Discussed the use of AI scribe software for clinical note transcription with the patient, who gave verbal consent to proceed.  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   Breast cancer (HCC) 03/2013   Left w/lumpectomy   Contact lens/glasses fitting    wears contacts or glasses   Dysrhythmia    irregular heart beat   Esophageal stricture    Esophagitis    Family history of breast cancer    GERD (gastroesophageal reflux disease)    H/O adenomatous polyp of colon    Hiatal hernia    Internal hemorrhoids    Macular degeneration    Left eye   Migraine  headache    Personal history of malignant neoplasm of breast 02/03/2023   Polyposis of colon 02/03/2023   PONV (postoperative nausea and vomiting)    Radiation 05/16/13-06/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   Uterine polyp    Vertigo     SURGICAL  HISTORY: Past Surgical History:  Procedure Laterality Date   BOWEL RESECTION  2017   BREAST BIOPSY Left 04/17/2013   Procedure: BREAST BIOPSY WITH NEEDLE LOCALIZATION;  Surgeon: Krystal JINNY Russell, MD;  Location: [facility];  Service: General;  Laterality: Left;   BREAST LUMPECTOMY WITH RADIOACTIVE SEED LOCALIZATION Left 07/12/2014   Procedure: LEFT BREAST LUMPECTOMY WITH RADIOACTIVE SEED LOCALIZATION;  Surgeon: Krystal Russell, MD;  Location: [facility];  Service: General;  Laterality: Left;   COLONOSCOPY  08/08/2019   poor prep, repeat colon 6/30   LAPAROSCOPIC BILATERAL SALPINGO OOPHERECTOMY Bilateral 03/19/2015   Procedure: LAPAROSCOPIC BILATERAL SALPINGO OOPHORECTOMY WITH EXTENSIVE LYSIS OF ABDOMINAL AND PELVIC ADHESIONS AND  PELVIC VICTORINE;  Surgeon: Curlee VEAR Guan, MD;  Location: [facility];  Service: Gynecology;  Laterality: Bilateral;   NISSEN FUNDOPLICATION  2006   OVARIAN CYST REMOVAL     SIMPLE MASTECTOMY WITH AXILLARY SENTINEL NODE BIOPSY Bilateral 02/24/2023   Procedure: LEFT SIMPLE MASTECTOMY AND RIGHT RISK REDUCING MASTECTOMY;  Surgeon: Vanderbilt Ned, MD;  Location: [facility];  Service: General;  Laterality: Bilateral;  PEC BLOCK   TONSILLECTOMY     TOTAL HIP ARTHROPLASTY Right 2005   UPPER GI ENDOSCOPY      SOCIAL HISTORY: Social History   Socioeconomic History   Marital status: Married    Spouse name: Sheri   Number of children: 0   Years of education: 16   Highest education level: Not on file  Occupational History   Occupation: PE TEACHER     Employer: [employer]    Comment: RETIRED   Tobacco Use   Smoking status: Former    Current packs/day: 0.00    Types: Cigarettes    Start date: 08/10/1972    Quit date: 08/10/1977    Years since quitting: 45.6   Smokeless tobacco: Never   Tobacco comments:    She smoked for 5 years in her early 45s.    Vaping Use   Vaping status: Never Used  Substance and Sexual  Activity   Alcohol use: Yes    Alcohol/week: 5.0 standard drinks of alcohol    Types: 5 Standard drinks or equivalent per week   Drug use: No   Sexual activity: Yes    Partners: Female  Other Topics Concern   Not on file  Social History Narrative   Marital Status:  Single Chief Financial Officer)    Children:  None    Pets:  None   Living Situation: Lives with her significant other Hospital Doctor)    Occupation: Retired Animator)    Education: Engineer, Maintenance (it) ([employer])    Tobacco Use/Exposure:  She smoked for 5 years in her early 30s.     Alcohol Use:  Moderate   Drug Use:  None   Diet:  Regular   Exercise:  Active Life    Hobbies:  Fishing, [place], Golf             Social Drivers of Health   Financial Resource Strain: Low Risk  (05/25/2022)   Received from [facility]   Overall Financial Resource Strain (CARDIA)    Difficulty of Paying Living Expenses: Not hard at all  Food  Insecurity: No Food Insecurity (02/03/2023)   Hunger Vital Sign    Worried About Running Out of Food in the Last Year: Never true    Ran Out of Food in the Last Year: Never true  Transportation Needs: No Transportation Needs (02/03/2023)   PRAPARE - Administrator, Civil Service (Medical): No    Lack of Transportation (Non-Medical): No  Physical Activity: Insufficiently Active (05/25/2022)   [facility] from [facility]   Exercise Vital Sign    Days of Exercise per Week: 3 days    Minutes of Exercise per Session: 20 min  Stress: No Stress Concern Present (05/25/2022)   Received from [facility] - Occupational Stress Questionnaire    Feeling of Stress : Not at all  Social Connections: Socially Integrated (05/25/2022)   Received from [facility]   Social Network    How would you rate your social network (family, work, friends)?: Good participation with social networks  Intimate Partner Violence: Not At Risk (02/03/2023)   Humiliation, Afraid, Rape, and  Kick questionnaire    Fear of Current or Ex-Partner: No    Emotionally Abused: No    Physically Abused: No    Sexually Abused: No    FAMILY HISTORY: Family History  Problem Relation Age of Onset   Lung cancer Mother 31       smoker   Heart attack Father    Breast cancer Maternal Aunt 45   Colon cancer Neg Hx    Esophageal cancer Neg Hx    Rectal cancer Neg Hx    Stomach cancer Neg Hx     ALLERGIES:  is allergic to adhesive [tape], morphine, and sulfonamide derivatives.  MEDICATIONS:  Current Outpatient Medications  Medication Sig Dispense Refill   aspirin 81 MG tablet Take 81 mg by mouth daily.     Calcium Carb-Cholecalciferol (CALCIUM + D3 PO) Take 2 tablets by mouth daily.      meclizine  (ANTIVERT ) 25 MG tablet Take 1 tablet (25 mg total) by mouth 3 (three) times daily as needed for dizziness. 90 tablet 1   metoprolol  succinate (TOPROL -XL) 50 MG 24 hr tablet TAKE ONE TABLET BY MOUTH DAILY     Milk Thistle 1000 MG CAPS Take 2 tablets by mouth daily.      Omega-3 Fatty Acids (FISH OIL) 1000 MG CAPS Take 3 capsules by mouth daily.      oxyCODONE  (OXY IR/ROXICODONE ) 5 MG immediate release tablet Take 1 tablet (5 mg total) by mouth every 6 (six) hours as needed for severe pain (pain score 7-10). 15 tablet 0   pantoprazole  (PROTONIX ) 20 MG tablet TAKE ONE TABLET BY MOUTH TWICE A DAY 180 tablet 2   SUMAtriptan  (IMITREX ) 20 MG/ACT nasal spray Place 1 spray (20 mg total) into the nose every 2 (two) hours as needed for migraine. 8 Inhaler 11   TURMERIC PO Take 2 tablets by mouth daily.      No current facility-administered medications for this visit.    REVIEW OF SYSTEMS:   Constitutional: Denies fevers, chills or abnormal night sweats Eyes: Denies blurriness of vision, double vision or watery eyes Ears, nose, mouth, throat, and face: Denies mucositis or sore throat Respiratory: Denies cough, dyspnea or wheezes Cardiovascular: Denies palpitation, chest discomfort or lower  extremity swelling Gastrointestinal:  Denies nausea, heartburn or change in bowel habits Skin: Denies abnormal skin rashes Lymphatics: Denies new lymphadenopathy or easy bruising Neurological:Denies numbness, tingling or new weaknesses Behavioral/Psych:  Mood is stable, no new changes  Breast: Denies any palpable lumps or discharge All other systems were reviewed with the patient and are negative.  PHYSICAL EXAMINATION: ECOG PERFORMANCE STATUS: 0 - Asymptomatic  Vitals:   03/23/23 0904  BP: 133/71  Pulse: 73  Resp: 16  Temp: 97.8 F (36.6 C)  SpO2: 100%   Filed Weights   03/23/23 0904  Weight: 151 lb 1.6 oz (68.5 kg)    GENERAL:alert, no distress and comfortable SKIN: skin color, texture, turgor are normal, no rashes or significant lesions EYES: normal, conjunctiva are pink and non-injected, sclera clear OROPHARYNX:no exudate, no erythema and lips, buccal mucosa, and tongue normal  NECK: supple, thyroid normal size, non-tender, without nodularity.  No cervical or axillary adenopathy ABDOMEN:abdomen soft, non-tender and normal bowel sounds Musculoskeletal:no cyanosis of digits and no clubbing  PSYCH: alert & oriented x 3 with fluent speech NEURO: no focal motor/sensory deficits BREAST: Palpable breast mass in the lower left breast measuring approximately 2 to 3 cm.  No regional adenopathy  LABORATORY DATA:  I have reviewed the data as listed Lab Results  Component Value Date   WBC 5.6 02/03/2023   HGB 13.8 02/03/2023   HCT 41.6 02/03/2023   MCV 91.0 02/03/2023   PLT 310 02/03/2023   Lab Results  Component Value Date   NA 141 02/03/2023   K 4.3 02/03/2023   CL 105 02/03/2023   CO2 29 02/03/2023    RADIOGRAPHIC STUDIES: I have personally reviewed the radiological reports and agreed with the findings in the report.  ASSESSMENT AND PLAN:  No problem-specific Assessment & Plan notes found for this encounter.    All questions were answered. The patient knows to  call the clinic with any problems, questions or concerns.    Amber Stalls, MD 03/23/23

## 2023-03-24 ENCOUNTER — Encounter: Payer: Self-pay | Admitting: *Deleted

## 2023-03-24 ENCOUNTER — Encounter: Payer: Self-pay | Admitting: Hematology and Oncology

## 2023-03-24 ENCOUNTER — Telehealth: Payer: Self-pay | Admitting: *Deleted

## 2023-03-24 NOTE — Telephone Encounter (Signed)
 [facility] orders faxed

## 2023-03-26 ENCOUNTER — Inpatient Hospital Stay (HOSPITAL_BASED_OUTPATIENT_CLINIC_OR_DEPARTMENT_OTHER): Payer: No Typology Code available for payment source | Admitting: Hematology and Oncology

## 2023-03-26 DIAGNOSIS — Z170 Estrogen receptor positive status [ER+]: Secondary | ICD-10-CM

## 2023-03-26 DIAGNOSIS — C50512 Malignant neoplasm of lower-outer quadrant of left female breast: Secondary | ICD-10-CM

## 2023-03-26 NOTE — Progress Notes (Signed)
[facility] CONSULT NOTE  Patient Care Team: Arliss Journey, PA-C as PCP - General (Physician Assistant) Pershing Proud, RN as Oncology Nurse Navigator Donnelly Angelica, RN as Oncology Nurse Navigator Harriette Bouillon, MD as Consulting Physician (General Surgery) Rachel Moulds, MD as Consulting Physician (Hematology and Oncology) Antony Blackbird, MD as Consulting Physician (Radiation Oncology)  CHIEF COMPLAINTS/PURPOSE OF CONSULTATION:  Newly diagnosed breast cancer  HISTORY OF PRESENTING ILLNESS:  Alison [name] 78 y.o. female is here because of recent diagnosis of left breast cancer  I reviewed her records extensively and collaborated the history with the patient.  SUMMARY OF ONCOLOGIC HISTORY: Oncology History  Malignant neoplasm of lower-outer quadrant of left breast of female, estrogen receptor positive (HCC)  01/27/2023 Mammogram   Screening / diagnostic mammogram showed 2 x 1.9 x 1.4 cm irregular mass in the left breast highly suggestive of malignancy, ultrasound is recommended to further evaluate mass and determine modality of biopsy.   01/27/2023 Pathology Results   Left breast needle core biopsy at 5:00 showed invasive mammary carcinoma, overall grade 2, prognostic showed ER 90% positive strong staining PR 70% positive moderate to strong staining, Ki-67 of 30%, HER2 1+   02/01/2023 Initial Diagnosis   Malignant neoplasm of lower-outer quadrant of left breast of female, estrogen receptor positive (HCC)   02/03/2023 Cancer Staging   Staging form: Breast, AJCC 8th Edition - Clinical stage from 02/03/2023: Stage IB (cT2, cN0, cM0, G2, ER+, PR+, HER2-) - Signed by Rachel Moulds, MD on 02/03/2023 Stage prefix: Initial diagnosis Histologic grading system: 3 grade system   02/16/2023 Genetic Testing   Negative genetic testing on the BRCAPlus and CancerNext-Expanded+RNAinsight panel.  The report date for both tests is 02/16/2023.  The CancerNext-Expanded gene  panel offered by [facility] and includes sequencing, rearrangement, and RNA analysis for the following 76 genes: AIP, ALK, APC, ATM, AXIN2, BAP1, BARD1, BMPR1A, BRCA1, BRCA2, BRIP1, CDC73, CDH1, CDK4, CDKN1B, CDKN2A, CEBPA, CHEK2, CTNNA1, DDX41, DICER1, ETV6, FH, FLCN, GATA2, LZTR1, MAX, MBD4, MEN1, MET, MLH1, MSH2, MSH3, MSH6, MUTYH, NF1, NF2, NTHL1, PALB2, PHOX2B, PMS2, POT1, PRKAR1A, PTCH1, PTEN, RAD51C, RAD51D, RB1, RET, RUNX1, SDHA, SDHAF2, SDHB, SDHC, SDHD, SMAD4, SMARCA4, SMARCB1, SMARCE1, STK11, SUFU, TMEM127, TP53, TSC1, TSC2, VHL, and WT1 (sequencing and deletion/duplication); EGFR, HOXB13, KIT, MITF, PDGFRA, POLD1, and POLE (sequencing only); EPCAM and GREM1 (deletion/duplication only).      The patient, with a history of stage 0 breast cancer in the left breast treated with lumpectomy and radiation in 2015, and atypical ductal hyperplasia in 2016, presents for discussion of treatment options following a recent diagnosis of invasive ductal cancer in the left breast.   Discussed the use of AI scribe software for clinical note transcription with the patient, who gave verbal consent to proceed.  History of Present Illness    The patient, with a history of breast cancer, recently underwent a bilateral mastectomy. She reports a generally good recovery, but expresses concern about three blisters that formed above the incision site on the right side. The patient describes these blisters as being the size of her finger and clustered together. She reports that the blisters have dried up and are not infected, but the area remains a bit raw.   The patient's oncotype score came back slightly above the borderline, indicating a higher risk of cancer recurrence. The patient expresses dread at the prospect of chemotherapy, which is one of the treatment options discussed. She is also considering hormone therapy (anti-estrogen), which she had previously  tried but discontinued due to side effects. The  patient is aware of the importance of this therapy in reducing the risk of cancer recurrence and is open to trying other medications besides tamoxifen, which she had taken previously.  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   Breast cancer (HCC) 03/2013   Left w/lumpectomy   Contact lens/glasses fitting    wears contacts or glasses   Dysrhythmia    irregular heart beat   Esophageal stricture    Esophagitis    Family history of breast cancer    GERD (gastroesophageal reflux disease)    H/O adenomatous polyp of colon    Hiatal hernia    Internal hemorrhoids    Macular degeneration    Left eye   Migraine headache    Personal history of malignant neoplasm of breast 02/03/2023   Polyposis of colon 02/03/2023   PONV (postoperative nausea and vomiting)    Radiation 05/16/13-06/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   Uterine polyp    Vertigo     SURGICAL HISTORY: Past Surgical History:  Procedure Laterality Date   BOWEL RESECTION  2017   BREAST BIOPSY Left 04/17/2013   Procedure: BREAST BIOPSY WITH NEEDLE LOCALIZATION;  Surgeon: Adolph Pollack, MD;  Location: [facility];  Service: General;  Laterality: Left;   BREAST LUMPECTOMY WITH RADIOACTIVE SEED LOCALIZATION Left 07/12/2014   Procedure: LEFT BREAST LUMPECTOMY WITH RADIOACTIVE SEED LOCALIZATION;  Surgeon: Avel Peace, MD;  Location: [facility];  Service: General;  Laterality: Left;   COLONOSCOPY  08/08/2019   poor prep, repeat colon 6/30   LAPAROSCOPIC BILATERAL SALPINGO OOPHERECTOMY Bilateral 03/19/2015   Procedure: LAPAROSCOPIC BILATERAL SALPINGO OOPHORECTOMY WITH EXTENSIVE LYSIS OF ABDOMINAL AND PELVIC ADHESIONS AND  PELVIC Romona Curls;  Surgeon: Ok Edwards, MD;  Location: [facility];  Service: Gynecology;  Laterality: Bilateral;   NISSEN FUNDOPLICATION   2006   OVARIAN CYST REMOVAL     SIMPLE MASTECTOMY WITH AXILLARY SENTINEL NODE BIOPSY Bilateral 02/24/2023   Procedure: LEFT SIMPLE MASTECTOMY AND RIGHT RISK REDUCING MASTECTOMY;  Surgeon: Harriette Bouillon, MD;  Location: [facility];  Service: General;  Laterality: Bilateral;  PEC BLOCK   TONSILLECTOMY     TOTAL HIP ARTHROPLASTY Right 2005   UPPER GI ENDOSCOPY      SOCIAL HISTORY: Social History   Socioeconomic History   Marital status: Married    Spouse name: Sheri   Number of children: 0   Years of education: 16   Highest education level: Not on file  Occupational History   Occupation: PE TEACHER     Employer: [employer]    Comment: RETIRED   Tobacco Use   Smoking status: Former    Current packs/day: 0.00    Types: Cigarettes    Start date: 08/10/1972    Quit date: 08/10/1977    Years since quitting: 45.6   Smokeless tobacco: Never   Tobacco comments:    She smoked for 5 years in her early 52s.    Vaping Use   Vaping status: Never Used  Substance and Sexual Activity   Alcohol use: Yes    Alcohol/week: 5.0 standard drinks of alcohol    Types: 5 Standard drinks or equivalent per week   Drug use: No   Sexual activity: Yes  Partners: Female  Other Topics Concern   Not on file  Social History Narrative   Marital Status:  Single Chief Financial Officer)    Children:  None    Pets:  None   Living Situation: Lives with her significant other Hospital doctor)    Occupation: Retired Animator)    Education: Engineer, maintenance (IT) ([employer])    Tobacco Use/Exposure:  She smoked for 5 years in her early 30s.     Alcohol Use:  Moderate   Drug Use:  None   Diet:  Regular   Exercise:  Active Life    Hobbies:  Fishing, [place], Golf             Social Drivers of Health   Financial Resource Strain: Low Risk  (05/25/2022)   Received from [facility]   Overall Financial Resource Strain (CARDIA)    Difficulty of Paying Living Expenses: Not hard at all  Food Insecurity: No  Food Insecurity (02/03/2023)   Hunger Vital Sign    Worried About Running Out of Food in the Last Year: Never true    Ran Out of Food in the Last Year: Never true  Transportation Needs: No Transportation Needs (02/03/2023)   PRAPARE - Administrator, Civil Service (Medical): No    Lack of Transportation (Non-Medical): No  Physical Activity: Insufficiently Active (05/25/2022)   Received from [facility]   Exercise Vital Sign    Days of Exercise per Week: 3 days    Minutes of Exercise per Session: 20 min  Stress: No Stress Concern Present (05/25/2022)   Received from [facility] - Occupational Stress Questionnaire    Feeling of Stress : Not at all  Social Connections: Socially Integrated (05/25/2022)   Received from [facility]   Social Network    How would you rate your social network (family, work, friends)?: Good participation with social networks  Intimate Partner Violence: Not At Risk (02/03/2023)   Humiliation, Afraid, Rape, and Kick questionnaire    Fear of Current or Ex-Partner: No    Emotionally Abused: No    Physically Abused: No    Sexually Abused: No    FAMILY HISTORY: Family History  Problem Relation Age of Onset   Lung cancer Mother 39       smoker   Heart attack Father    Breast cancer Maternal Aunt 45   Colon cancer Neg Hx    Esophageal cancer Neg Hx    Rectal cancer Neg Hx    Stomach cancer Neg Hx     ALLERGIES:  is allergic to adhesive [tape], morphine, and sulfonamide derivatives.  MEDICATIONS:  Current Outpatient Medications  Medication Sig Dispense Refill   aspirin 81 MG tablet Take 81 mg by mouth daily.     Calcium Carb-Cholecalciferol (CALCIUM + D3 PO) Take 2 tablets by mouth daily.      meclizine (ANTIVERT) 25 MG tablet Take 1 tablet (25 mg total) by mouth 3 (three) times daily as needed for dizziness. 90 tablet 1   metoprolol succinate (TOPROL-XL) 50 MG 24 hr tablet TAKE ONE TABLET BY  MOUTH DAILY     Milk Thistle 1000 MG CAPS Take 2 tablets by mouth daily.      Omega-3 Fatty Acids (FISH OIL) 1000 MG CAPS Take 3 capsules by mouth daily.      oxyCODONE (OXY IR/ROXICODONE) 5 MG immediate release tablet Take 1 tablet (5 mg total) by mouth every 6 (six) hours as  needed for severe pain (pain score 7-10). 15 tablet 0   pantoprazole (PROTONIX) 20 MG tablet TAKE ONE TABLET BY MOUTH TWICE A DAY 180 tablet 2   SUMAtriptan (IMITREX) 20 MG/ACT nasal spray Place 1 spray (20 mg total) into the nose every 2 (two) hours as needed for migraine. 8 Inhaler 11   TURMERIC PO Take 2 tablets by mouth daily.      No current facility-administered medications for this visit.    REVIEW OF SYSTEMS:   Constitutional: Denies fevers, chills or abnormal night sweats Eyes: Denies blurriness of vision, double vision or watery eyes Ears, nose, mouth, throat, and face: Denies mucositis or sore throat Respiratory: Denies cough, dyspnea or wheezes Cardiovascular: Denies palpitation, chest discomfort or lower extremity swelling Gastrointestinal:  Denies nausea, heartburn or change in bowel habits Skin: Denies abnormal skin rashes Lymphatics: Denies new lymphadenopathy or easy bruising Neurological:Denies numbness, tingling or new weaknesses Behavioral/Psych: Mood is stable, no new changes  Breast: Denies any palpable lumps or discharge All other systems were reviewed with the patient and are negative.  PHYSICAL EXAMINATION: ECOG PERFORMANCE STATUS: 0 - Asymptomatic  There were no vitals filed for this visit.  There were no vitals filed for this visit.   GENERAL:alert, no distress and comfortable Bilateral mastectomy site healed well. Right side axillary incision with small area of dehiscence, pale granulation tissue, no evidence of infection.  LABORATORY DATA:  I have reviewed the data as listed Lab Results  Component Value Date   WBC 5.6 02/03/2023   HGB 13.8 02/03/2023   HCT 41.6 02/03/2023    MCV 91.0 02/03/2023   PLT 310 02/03/2023   Lab Results  Component Value Date   NA 141 02/03/2023   K 4.3 02/03/2023   CL 105 02/03/2023   CO2 29 02/03/2023    RADIOGRAPHIC STUDIES: I have personally reviewed the radiological reports and agreed with the findings in the report.  ASSESSMENT AND PLAN:   This is a very pleasant 78 year old postmenopausal female patient with history of left breast DCIS, ER/PR positive status post lumpectomy and XRT in 2015 now presents with new diagnosis of left breast IDC to the breast MDC.   Invasive Ductal Carcinoma, Left Breast New diagnosis of invasive ductal carcinoma in the left breast, estrogen receptor positive, progesterone receptor positive, and HER2 negative. Previous history of stage zero breast cancer in the left breast in 2015 treated with lumpectomy and radiation.  Bilateral mastectomy performed with two areas of cancer identified on the left side (2.8 cm and 2 mm). Margins clear. Oncotype score of 27, indicating a higher risk of distant recurrence. Discussed the benefits and risks of chemotherapy and anti-estrogen therapy. -Consider chemotherapy with TC (4 cycles over 12 weeks) to reduce recurrence risk. -   All questions were answered. The patient knows to call the clinic with any problems, questions or concerns.    Rachel Moulds, MD 03/26/23

## 2023-03-29 ENCOUNTER — Encounter: Payer: Self-pay | Admitting: *Deleted

## 2023-03-30 ENCOUNTER — Other Ambulatory Visit: Payer: Self-pay | Admitting: *Deleted

## 2023-03-30 ENCOUNTER — Telehealth: Payer: Self-pay | Admitting: *Deleted

## 2023-03-30 DIAGNOSIS — Z170 Estrogen receptor positive status [ER+]: Secondary | ICD-10-CM

## 2023-03-30 NOTE — Telephone Encounter (Signed)
Received notification pt to see Dr Abbe Amsterdam on 1/29 for 2nd opinion regarding chemo. Dr Al Pimple notified.

## 2023-03-30 NOTE — Progress Notes (Signed)
Amb

## 2023-03-30 NOTE — Telephone Encounter (Signed)
Per MD request this RN sent referral for second opinion with Dr Mathis Bud at [facility] sent via [facility] and faxed.  Phone number for referral [phone] Fax number [phone]  [phone]

## 2023-04-02 ENCOUNTER — Encounter: Payer: Self-pay | Admitting: *Deleted

## 2023-04-04 ENCOUNTER — Encounter: Payer: Self-pay | Admitting: Hematology and Oncology

## 2023-04-08 ENCOUNTER — Encounter: Payer: Self-pay | Admitting: *Deleted

## 2023-04-14 ENCOUNTER — Telehealth: Payer: Self-pay

## 2023-04-14 NOTE — Telephone Encounter (Signed)
 Called pt per MD to advise [employer] reveal testing was negative/not detected. Pt verbalized understanding of results and knows [employer] will be in touch to schedule 6 mo repeat lab.

## 2023-04-15 ENCOUNTER — Encounter: Payer: Self-pay | Admitting: Hematology and Oncology

## 2023-06-02 ENCOUNTER — Telehealth: Payer: Self-pay | Admitting: Gastroenterology

## 2023-06-02 NOTE — Telephone Encounter (Signed)
 She needs to be seen in the office to discuss this if you can book her an appointment - she may not need an exam depending on how she feels about this.  Recommendations from her last procedure note are as outlined below. In the future if a patient wants recommendations on timing of next procedure it is usually in the pathology report or procedure report of the last exam.  "Dear Alison [name],   The 3 polyps that I removed during your recent procedure were proven to be adenomatous.  These are considered to be pre-cancerous polyps that may have grown into cancers if they had not been removed. This is similar to the polyp you had removed on your prior colonoscopy, so a total of 4 pre-cancerous polyps recently. Studies show that at least 20% of women over age 86 and 30% of men over age 20 have pre-cancerous polyps. Based on current nationally recognized surveillance guidelines, one would normally recommend that you have a repeat colonoscopy in 3 to 5 years based on that result. However, at that time you will be between 36-46 years old, an age at which the risks can start to outweigh the benefits of future exams. I would like to see you back in the office at that time to discuss this issue further and determine if you wish to have any further colonoscopy exams."

## 2023-06-02 NOTE — Telephone Encounter (Signed)
 Good morning Dr. Adela Lank,   Patient called wondering when her recall colonoscopy would be due. Please advise.    Thank you

## 2023-06-07 NOTE — Telephone Encounter (Signed)
 Advised patient of Dr. Lanetta Inch recommendations.

## 2023-09-03 ENCOUNTER — Telehealth: Payer: Self-pay

## 2023-09-03 NOTE — Telephone Encounter (Signed)
 MD ordered [employer] Reveal on pt. Patient did not respond to [employer] when they reached out. If pt returns call to set up testing, number is [phone].
